# Patient Record
Sex: Female | Born: 1955
Health system: Southern US, Community
[De-identification: ages and names within clinical notes are randomized; demographics above are authoritative.]

## PROBLEM LIST (undated history)

## (undated) DIAGNOSIS — F32A Depression, unspecified: Secondary | ICD-10-CM

## (undated) DIAGNOSIS — M1990 Unspecified osteoarthritis, unspecified site: Secondary | ICD-10-CM

## (undated) DIAGNOSIS — F419 Anxiety disorder, unspecified: Secondary | ICD-10-CM

## (undated) DIAGNOSIS — F329 Major depressive disorder, single episode, unspecified: Secondary | ICD-10-CM

## (undated) DIAGNOSIS — M797 Fibromyalgia: Secondary | ICD-10-CM

## (undated) DIAGNOSIS — Z9189 Other specified personal risk factors, not elsewhere classified: Secondary | ICD-10-CM

## (undated) HISTORY — DX: Fibromyalgia: M79.7

## (undated) HISTORY — PX: OVARIAN CYST REMOVAL: SHX89

## (undated) HISTORY — PX: JOINT REPLACEMENT: SHX530

## (undated) HISTORY — PX: KNEE ARTHROSCOPY: SHX127

## (undated) HISTORY — PX: OTHER SURGICAL HISTORY: SHX169

## (undated) HISTORY — DX: Other specified personal risk factors, not elsewhere classified: Z91.89

## (undated) HISTORY — DX: Unspecified osteoarthritis, unspecified site: M19.90

## (undated) HISTORY — PX: KNEE SURGERY: SHX244

---

## 2004-09-19 ENCOUNTER — Other Ambulatory Visit: Admission: RE | Admit: 2004-09-19 | Discharge: 2004-09-19 | Payer: Self-pay | Admitting: Gynecology

## 2005-12-04 ENCOUNTER — Other Ambulatory Visit: Admission: RE | Admit: 2005-12-04 | Discharge: 2005-12-04 | Payer: Self-pay | Admitting: Obstetrics and Gynecology

## 2007-11-26 ENCOUNTER — Other Ambulatory Visit: Admission: RE | Admit: 2007-11-26 | Discharge: 2007-11-26 | Payer: Self-pay | Admitting: Obstetrics and Gynecology

## 2008-05-15 ENCOUNTER — Encounter: Admission: RE | Admit: 2008-05-15 | Discharge: 2008-05-15 | Payer: Self-pay | Admitting: Family Medicine

## 2008-12-08 ENCOUNTER — Other Ambulatory Visit: Admission: RE | Admit: 2008-12-08 | Discharge: 2008-12-08 | Payer: Self-pay | Admitting: Obstetrics and Gynecology

## 2015-11-21 ENCOUNTER — Other Ambulatory Visit: Payer: Self-pay

## 2015-11-21 ENCOUNTER — Other Ambulatory Visit: Payer: Self-pay | Admitting: Physician Assistant

## 2015-11-21 ENCOUNTER — Ambulatory Visit
Admission: RE | Admit: 2015-11-21 | Discharge: 2015-11-21 | Disposition: A | Discharge status: Home / Self Care | Payer: Medicaid Other | Source: Ambulatory Visit | Attending: Physician Assistant | Admitting: Physician Assistant

## 2015-11-21 DIAGNOSIS — M25562 Pain in left knee: Secondary | ICD-10-CM

## 2015-11-21 DIAGNOSIS — M25561 Pain in right knee: Secondary | ICD-10-CM

## 2015-11-21 DIAGNOSIS — Z1231 Encounter for screening mammogram for malignant neoplasm of breast: Secondary | ICD-10-CM

## 2015-12-11 ENCOUNTER — Ambulatory Visit
Admission: RE | Admit: 2015-12-11 | Discharge: 2015-12-11 | Disposition: A | Discharge status: Home / Self Care | Payer: Medicaid Other | Source: Ambulatory Visit

## 2015-12-11 DIAGNOSIS — Z1231 Encounter for screening mammogram for malignant neoplasm of breast: Secondary | ICD-10-CM

## 2016-04-03 ENCOUNTER — Ambulatory Visit: Payer: Medicare Other | Attending: Specialist | Admitting: Physical Therapy

## 2016-04-03 ENCOUNTER — Encounter: Payer: Self-pay | Admitting: Physical Therapy

## 2016-04-03 DIAGNOSIS — M25562 Pain in left knee: Secondary | ICD-10-CM | POA: Diagnosis present

## 2016-04-03 DIAGNOSIS — M25662 Stiffness of left knee, not elsewhere classified: Secondary | ICD-10-CM | POA: Diagnosis present

## 2016-04-03 DIAGNOSIS — R262 Difficulty in walking, not elsewhere classified: Secondary | ICD-10-CM | POA: Insufficient documentation

## 2016-04-03 DIAGNOSIS — M797 Fibromyalgia: Secondary | ICD-10-CM | POA: Insufficient documentation

## 2016-04-03 NOTE — Therapy (Signed)
[place], [place], [address] Phone: [phone]   Fax:  [phone]  Physical Therapy Treatment  Patient Details  Name: Betty Holmes MRN: [id number] Date of Birth: 01-Apr-1956 Referring Provider: Hart Robinsons   Encounter Date: 04/03/2016      PT End of Session - 04/03/16 1707    Visit Number 1   Number of Visits 16   Date for PT Re-Evaluation 05/29/16   PT Start Time 1336   PT Stop Time 1430   PT Time Calculation (min) 54 min   Activity Tolerance Patient tolerated treatment well   Behavior During Therapy [facility] for tasks assessed/performed      Past Medical History  Diagnosis Date  . Fibromyalgia     No past surgical history on file.  There were no vitals filed for this visit.      Subjective Assessment - 04/03/16 1403    Subjective Pt comes in today reporting 6/10 pain in the R knee.    Pertinent History Pt has a long histroy of poping and pain in her knee. 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 She had a medial and lateral menical debirdement and syneevectomy on 03-21-16. Since that point she has been unable to kneell and to walk  community distances.    How long can you sit comfortably? she can sit for about a half an hour    How long can you stand comfortably? patient can stand comfortably for 15 min    How long can you walk comfortably? 15 minutes befoire the knee starts to hurt.    Diagnostic tests nothing post op    Patient Stated Goals to be able to walk again    Pain Score 6    Pain Location Knee   Pain Orientation Right   Pain Descriptors / Indicators Aching   Pain Onset Today   Pain Frequency Constant   Aggravating Factors  standing and walking    Pain Relieving Factors rest and aspirn    Effect of Pain on Daily Activities difficulty performing workj tasks             [facility] PT Assessment - 04/03/16 0001    Assessment   Medical  Diagnosis L knee menisectomy and synovectomy    Referring Provider Hart Robinsons    Onset Date/Surgical Date 03/22/16   Next MD Visit 04/29/16   Prior Therapy n   Precautions   Precautions None   Restrictions   Weight Bearing Restrictions No   Balance Screen   Has the patient fallen in the past 6 months No   Home Environment   Additional Comments Pt has 4 steps to get into the house.    Prior Function   Level of Independence --  Indepdnent and an avid walker    Cognition   Overall Cognitive Status Within Functional Limits for tasks assessed   Observation/Other Assessments   Observations good posture, Siignificant lateral placement of L patella    Sensation   Additional Comments No decrease in sensation noted.    Posture/Postural Control   Posture Comments Normal posture   ROM / Strength   AROM / PROM / Strength AROM;PROM;Strength   AROM   Overall AROM  --  L active ROM 130   Left Knee Extension 110   PROM   Left Knee Extension 115   Strength   Overall Strength Comments R hip flexion 4/5 all other  hip strength 5/5    Left Knee Flexion 5/5   Left Knee Extension 4+/5   Palpation   Patella mobility poor superior /inferior patella movement on the R compared to the L    Palpation comment No significant TTP    Ambulation/Gait   Gait Comments decreased L heel strike. hell strike improved with cuing    Balance   Balance Assessed --  Single leg stance on the L 10 seconds without UE support.                      [place] Adult PT Treatment/Exercise - 04/03/16 0001    Knee/Hip Exercises: Standing   Knee Flexion Limitations standing march 1x10    Hip ADduction Limitations standing 1x10    Extension Limitations 1x10    SLS 3x10sec holds    Knee/Hip Exercises: Supine   Quad Sets Limitations 2x10   Straight Leg Raises Limitations 2x10                PT Education - 04/03/16 1705    Education provided Yes   Education Details Pt educated on surgery and  expected outcomes. She was also educated on the causes of crpitus early in post op rehabilitation. Educated on symptom mangement and walking distances.    Person(s) Educated Patient   Methods Explanation;Demonstration;Verbal cues;Handout;Tactile cues   Comprehension Verbalized understanding;Returned demonstration          PT Short Term Goals - 04/03/16 1722    PT SHORT TERM GOAL #1   Title Pt will demsotrate full pain free knee PROM    Baseline pain at end ranges    Time 4   Period Weeks   Status New   PT SHORT TERM GOAL #2   Title Pt will increase L single leg stance to 20 seconds   Baseline 10 seconds on the L    Time 4   Period Weeks   Status New   PT SHORT TERM GOAL #3   Title Pt will demsotrate 5/5 gross B LE strength    Baseline 4/5 B hip flexion strength    Time 4   Period Weeks   Status New   PT SHORT TERM GOAL #4   Title Pt will report 2/10 pain at worst   Baseline 6/10 pain at worst    Time 4   Period Weeks   Status New           PT Long Term Goals - 04/03/16 1725    PT LONG TERM GOAL #1   Title Pt will ambualte a mile without significant pain in order to perform main form of exercise    Baseline difficulty performing community distances.    PT LONG TERM GOAL #2   Title Pt will kneel for 35 minutes without self reported pain in order to perform work tasks.    Baseline unable    PT LONG TERM GOAL #3   Title Pt will go up and down 6 steps with reciprocal gait pattern in order to go in and out of her house safely    Baseline step to step pattern    Time 8   Period Weeks   Status Not Met               Plan - 04/03/16 1715    Clinical Impression Statement Pt is a 60 year old female S/P medial and lateral menisectomys on 02-21-15. She presents with decreased strength, stability and ability to  ambulate community distances. She also has to kneel for long periods of time at her job. She is a low complexity eval. She would benefit from skilled therapy to  decrease pain and improve her ability to perfom work tasks.    Rehab Potential Good   PT Frequency 2x / week   PT Duration 8 weeks   PT Treatment/Interventions ADLs/Self Care Home Management;Electrical Stimulation;Cryotherapy;Ultrasound;Functional mobility training;Therapeutic activities;Therapeutic exercise;Manual techniques;Patient/family education   PT Next Visit Plan add mini squats, steps, exercise bike, heel raise, LAQ SAQ TKE, Continue to work on regaining full pain free ROM    PT Home Exercise Plan quad sets, heel slides with a strap, knee extension with towell roll, single leg stance.  SLR    Consulted and Agree with Plan of Care Patient      Patient will benefit from skilled therapeutic intervention in order to improve the following deficits and impairments:  Decreased mobility, Difficulty walking, Decreased range of motion, Decreased endurance, Increased edema, Pain, Hypermobility  Visit Diagnosis: Pain in left knee - Plan: PT plan of care cert/re-cert  Stiffness of left knee, not elsewhere classified - Plan: PT plan of care cert/re-cert  Difficulty in walking, not elsewhere classified - Plan: PT plan of care cert/re-cert       G-Codes - 2016/04/23 1729    Functional Assessment Tool Used clinical decision making, objective measures, PLOF   Functional Limitation Mobility: Walking and moving around   Mobility: Walking and Moving Around Current Status (E4235) At least 40 percent but less than 60 percent impaired, limited or restricted   Mobility: Walking and Moving Around Goal Status (T6144) At least 1 percent but less than 20 percent impaired, limited or restricted      Problem List Patient Active Problem List   Diagnosis Date Noted  . Fibromyalgia     Carney Living PT DPT  Apr 23, 2016, 5:32 PM  [facility] [address] [place], [place], [address] Phone: [phone]   Fax:  [phone]  Name: Betty Holmes MRN: [id number] Date of Birth: [birth year]

## 2016-04-08 ENCOUNTER — Ambulatory Visit: Payer: Medicare Other | Admitting: Physical Therapy

## 2016-04-08 DIAGNOSIS — M25562 Pain in left knee: Secondary | ICD-10-CM | POA: Diagnosis not present

## 2016-04-08 DIAGNOSIS — M25662 Stiffness of left knee, not elsewhere classified: Secondary | ICD-10-CM

## 2016-04-08 DIAGNOSIS — R262 Difficulty in walking, not elsewhere classified: Secondary | ICD-10-CM

## 2016-04-08 NOTE — Therapy (Signed)
[place], [place], [address] Phone: [phone]   Fax:  [phone]  Physical Therapy Treatment  Patient Details  Name: Betty Holmes MRN: [id number] Date of Birth: December 02, 1956 Referring Provider: Hart Robinsons   Encounter Date: 04/08/2016      PT End of Session - 04/08/16 1410    Visit Number 2   Number of Visits 16   Date for PT Re-Evaluation 05/29/16   PT Start Time 1332   PT Stop Time 1426   PT Time Calculation (min) 54 min   Activity Tolerance Patient tolerated treatment well   Behavior During Therapy [facility] for tasks assessed/performed      Past Medical History  Diagnosis Date  . Fibromyalgia     No past surgical history on file.  There were no vitals filed for this visit.      Subjective Assessment - 04/08/16 1340    Subjective No pain in knee today. My legs always hurt and are swollen. Fibro? Has been doing her ex at home.     Currently in Pain? No/denies            [facility] Adult PT Treatment/Exercise - 04/08/16 1346    Knee/Hip Exercises: Aerobic   Nustep L 5 LE only for 6 min    Knee/Hip Exercises: Standing   Hip Abduction Stengthening;Both;1 set;10 reps;Knee straight   Hip Extension Stengthening;Both;1 set;10 reps   Extension Limitations cues to activate quad    Forward Step Up Left;1 set;15 reps;Hand Hold: 2   Functional Squat 1 set;10 reps   Functional Squat Limitations pain in ant knee    SLS with Vectors Arcs, in SLS each LE with 1 UE asst intermittently   Knee/Hip Exercises: Seated   Long Arc Quad Strengthening;Left;1 set;20 reps;Other (comment)   Long Arc Quad Limitations ball squeeze for VMO    Ball Squeeze x 10    Knee/Hip Exercises: Supine   Quad Sets Strengthening;Left;1 set;10 reps   [employer] Limitations HEP    Short Arc [employer] Strengthening;Left;1 set;10 reps   Short Hydrologist Limitations HEP   Straight Leg Raises Strengthening;Left;1 set;20  reps   Straight Leg Raises Limitations HEP    Straight Leg Raise with External Rotation Strengthening;Left;1 set;10 reps   Straight Leg Raise with External Rotation Limitations HEP   Modalities   Modalities Cryotherapy   Cryotherapy   Number Minutes Cryotherapy 8 Minutes   Cryotherapy Location Knee   Type of Cryotherapy Ice pack                PT Education - 04/08/16 1409    Education provided Yes   Education Details HEP, cues for quad muscle activation    Person(s) Educated Patient   Methods Explanation;Demonstration   Comprehension Verbalized understanding;Returned demonstration          PT Short Term Goals - 04/08/16 1422    PT SHORT TERM GOAL #1   Title Pt will demonstrate full pain free knee PROM    Status On-going   PT SHORT TERM GOAL #2   Title Pt will increase L single leg stance to 20 seconds   Status On-going   PT SHORT TERM GOAL #3   Title Pt will demsotrate 5/5 gross B LE strength    Status On-going   PT SHORT TERM GOAL #4   Title Pt will report 2/10 pain at worst   Status On-going  PT Long Term Goals - 04/08/16 1421    PT LONG TERM GOAL #1   Title Pt will ambulate a mile without significant pain in order to perform main form of exercise    Status On-going   PT LONG TERM GOAL #2   Title Pt will kneel for 35 minutes without self reported pain in order to perform work tasks.    Status On-going   PT LONG TERM GOAL #3   Title Pt will go up and down 6 steps with reciprocal gait pattern in order to go in and out of her house safely    Status On-going               Plan - 04/08/16 1410    Clinical Impression Statement Patient able to do all exercises prescribed with min cues.  She does report Rt. knee pain and instability at times due to overuse.     PT Next Visit Plan add mini squats, steps, exercise bike, heel raise, LAQ SAQ TKE, Continue to work on regaining full pain free ROM    PT Home Exercise Plan quad sets, heel slides  with a strap, knee extension with towell roll, single leg stance.  SLR    Consulted and Agree with Plan of Care Patient      Patient will benefit from skilled therapeutic intervention in order to improve the following deficits and impairments:     Visit Diagnosis: Pain in left knee  Stiffness of left knee, not elsewhere classified  Difficulty in walking, not elsewhere classified     Problem List Patient Active Problem List   Diagnosis Date Noted  . Fibromyalgia     Lonza Shimabukuro 04/08/2016, 2:30 PM  [place], [place], [address] Phone: [phone]   Fax:  [phone]  Name: Betty Holmes MRN: [id number] Date of Birth: 11-29-1956   Raeford Razor, PT 04/08/2016 2:30 PM Phone: [phone] Fax: [phone]

## 2016-04-10 ENCOUNTER — Ambulatory Visit: Payer: Medicare Other | Admitting: Physical Therapy

## 2016-04-10 DIAGNOSIS — M25562 Pain in left knee: Secondary | ICD-10-CM | POA: Diagnosis not present

## 2016-04-10 DIAGNOSIS — M25662 Stiffness of left knee, not elsewhere classified: Secondary | ICD-10-CM

## 2016-04-10 DIAGNOSIS — R262 Difficulty in walking, not elsewhere classified: Secondary | ICD-10-CM

## 2016-04-10 NOTE — Therapy (Signed)
[place], [place], [address] Phone: [phone]   Fax:  [phone]  Physical Therapy Treatment  Patient Details  Name: CAMANI TAUBMAN MRN: [id number] Date of Birth: [birth year] Referring Provider: Hart Robinsons   Encounter Date: 04/10/2016      PT End of Session - 04/10/16 1622    Visit Number 3   Number of Visits 16   Date for PT Re-Evaluation 05/29/16   PT Start Time 1330   PT Stop Time 1415   PT Time Calculation (min) 45 min   Activity Tolerance Patient tolerated treatment well   Behavior During Therapy [facility] for tasks assessed/performed      Past Medical History  Diagnosis Date  . Fibromyalgia     No past surgical history on file.  There were no vitals filed for this visit.      Subjective Assessment - 04/10/16 1335    Subjective 2/10 soreness with sitting   .She did 30 minutes of yoga she noticed a clunk inside kneee or hip    Currently in Pain? Yes   Pain Score 2   up to 7/10   Pain Descriptors / Indicators Aching   Aggravating Factors  YOGA,  doing too much   Pain Relieving Factors excedrine.   Multiple Pain Sites --  Has stenosis in neck constant.                           [place] Adult PT Treatment/Exercise - 04/10/16 0001    Self-Care   Self-Care --  do's and don'ts,  no pivot and twist,  avoid harmful pain,+   Knee/Hip Exercises: Aerobic   Recumbent Bike 6 minutes 1.4 miles   Knee/Hip Exercises: Standing   Heel Raises 10 reps   Terminal Knee Extension Limitations 10 X each with purple ball against wall. 10 X added to hpome program   Hip Abduction Stengthening;Both;1 set;10 reps;Knee straight  10 X   Abduction Limitations cues for neutral hip vs ER   Hip Extension Stengthening;10 reps   Extension Limitations cues to activate quad    Functional Squat 10 reps  clunking in knee so stopped and did not add.mini squat   Knee/Hip Exercises: Seated   Long  Arc Quad Strengthening;10 reps  20 X 0 LBS   Long Arc Quad Weight --  3 reps with AA extension 5 second hold and eccentric lowerin   Knee/Hip Exercises: Supine   Short Arc Quad Sets Strengthening;Left;1 set;10 reps   Straight Leg Raises Strengthening  cued for quad set                PT Education - 04/10/16 1621    Education provided Yes   Education Details knee strengthening, DO's and don'ts   Person(s) Educated Patient   Methods Explanation;Demonstration;Tactile cues;Verbal cues;Handout   Comprehension Verbalized understanding;Returned demonstration          PT Short Term Goals - 04/10/16 1625    PT SHORT TERM GOAL #1   Title Pt will demonstrate full pain free knee PROM    Time 4   Period Weeks   Status On-going   PT SHORT TERM GOAL #2   Title Pt will increase L single leg stance to 20 seconds   Time 4   Period Weeks   Status Unable to assess   PT SHORT TERM GOAL #3   Title Pt will demsotrate 5/5 gross B LE strength  Time 4   Period Weeks   Status Unable to assess   PT SHORT TERM GOAL #4   Title Pt will report 2/10 pain at worst   Baseline 6-7/10   Time 4   Period Weeks   Status On-going           PT Long Term Goals - 04/08/16 1421    PT LONG TERM GOAL #1   Title Pt will ambulate a mile without significant pain in order to perform main form of exercise    Status On-going   PT LONG TERM GOAL #2   Title Pt will kneel for 35 minutes without self reported pain in order to perform work tasks.    Status On-going   PT LONG TERM GOAL #3   Title Pt will go up and down 6 steps with reciprocal gait pattern in order to go in and out of her house safely    Status On-going               Plan - 04/10/16 1623    Clinical Impression Statement NO increased pain with exercise.  Progress toward home exercise goals.  She had many questions about what she can/shouldn't do with knee.  Knee clunks with instaility? with mini squat so stopped.    PT Next  Visit Plan review LAQ, terminal knee, ball at wall.  Try some closed chain. 4 in step ups?calf stretches.    PT Home Exercise Plan LAQ, terminal knee extension.   Consulted and Agree with Plan of Care Patient      Patient will benefit from skilled therapeutic intervention in order to improve the following deficits and impairments:  Decreased mobility, Difficulty walking, Decreased range of motion, Decreased endurance, Increased edema, Pain, Hypermobility  Visit Diagnosis: Pain in left knee  Stiffness of left knee, not elsewhere classified  Difficulty in walking, not elsewhere classified     Problem List Patient Active Problem List   Diagnosis Date Noted  . Fibromyalgia     HARRIS,KAREN 04/10/2016, 4:27 PM  [facility] [address] [place], [place], [address] Phone: [phone]   Fax:  [phone]  Name: HADLEI RAMALEY MRN: [id number] Date of Birth: 01-31-1956    Melvenia Needles, PTA 04/10/2016 4:27 PM Phone: [phone] Fax: [phone]

## 2016-04-10 NOTE — Patient Instructions (Signed)
Hand drawn: terminal knee extension, pressing pillow into wall.  1-2 X a day.  10 to 30 X 5 second hold. From exercise drawer: LAQ  AA lift into extension, hold 5 seconds, lower slowly.  Pain free, may use light weights. Daily

## 2016-04-16 ENCOUNTER — Ambulatory Visit: Payer: Medicare Other | Admitting: Physical Therapy

## 2016-04-16 DIAGNOSIS — M25562 Pain in left knee: Secondary | ICD-10-CM

## 2016-04-16 DIAGNOSIS — M25662 Stiffness of left knee, not elsewhere classified: Secondary | ICD-10-CM

## 2016-04-16 DIAGNOSIS — R262 Difficulty in walking, not elsewhere classified: Secondary | ICD-10-CM

## 2016-04-16 NOTE — Therapy (Signed)
[place], [place], [address] Phone: [phone]   Fax:  [phone]  Physical Therapy Treatment  Patient Details  Name: Betty Holmes MRN: [id number] Date of Birth: 1956-06-20 Referring Provider: Hart Robinsons   Encounter Date: 04/16/2016      PT End of Session - 04/16/16 1410    Visit Number 4   Number of Visits 16   Date for PT Re-Evaluation 05/29/16   Authorization Type medicare/ medicaid    Authorization Time Period KX modifier at 15 visits    PT Start Time 0845   PT Stop Time 0930   PT Time Calculation (min) 45 min      Past Medical History  Diagnosis Date  . Fibromyalgia     No past surgical history on file.  There were no vitals filed for this visit.                       [place] Adult PT Treatment/Exercise - 04/16/16 0001    Knee/Hip Exercises: Aerobic   Recumbent Bike 6 minutes 1.4 miles   Knee/Hip Exercises: Standing   Heel Raises 20 reps   Hip Abduction Stengthening;Both;1 set;10 reps;Knee straight  10 X   Abduction Limitations cues for neutral hip vs ER   Hip Extension Stengthening;10 reps   Extension Limitations cues to activate quad    Lateral Step Up Limitations 2x10 6 inch step    Forward Step Up Limitations 2x10 6 inch step    Functional Squat Limitations 20reps   Rocker Board Limitations 3x20sec holds    Knee/Hip Exercises: Seated   Long Arc Quad Limitations 2x10 lb    Knee/Hip Exercises: Supine   Short Arc Quad Sets Strengthening;Left;1 set;10 reps   Short Arc Quad Sets Limitations 2x10 2lb    Straight Leg Raises Strengthening  cued for quad set   Straight Leg Raises Limitations 2x10 1 lb                 PT Education - 04/16/16 1325    Education Details begin single leg stance at home    Person(s) Educated Patient   Methods Explanation   Comprehension Verbalized understanding;Returned demonstration          PT Short Term  Goals - 04/16/16 1413    PT SHORT TERM GOAL #1   Title Pt will demonstrate full pain free knee PROM    Baseline slight pain at end range but improving    Time 4   Period Days   Status On-going   PT SHORT TERM GOAL #2   Title Pt will increase L single leg stance to 20 seconds   Baseline 20 seconds performed with no LOB    Time 4   Period Weeks   Status Achieved   PT SHORT TERM GOAL #3   Title Pt will demsotrate 5/5 gross B LE strength    Baseline 4/5 B hip flexion strength    Time 4   Period Weeks   Status On-going   PT SHORT TERM GOAL #4   Title Pt will report 2/10 pain at worst   Baseline 6-7/10   Time 4   Period Weeks   Status On-going           PT Long Term Goals - 04/08/16 1421    PT LONG TERM GOAL #1   Title Pt will ambulate a mile without significant pain in order to perform main form of exercise  Status On-going   PT LONG TERM GOAL #2   Title Pt will kneel for 35 minutes without self reported pain in order to perform work tasks.    Status On-going   PT LONG TERM GOAL #3   Title Pt will go up and down 6 steps with reciprocal gait pattern in order to go in and out of her house safely    Status On-going               Plan - 04/16/16 1411    Clinical Impression Statement Pt tolerated treatment well. Good single leg stability noted today. Good stability noted with stairs See below for goal speciific progress.    Rehab Potential Good   PT Frequency 2x / week   PT Duration 8 weeks   PT Treatment/Interventions ADLs/Self Care Home Management;Electrical Stimulation;Cryotherapy;Ultrasound;Functional mobility training;Therapeutic activities;Therapeutic exercise;Manual techniques;Patient/family education   PT Next Visit Plan review LAQ, terminal knee, ball at wall.  Try some closed chain. 4 in step ups?calf stretches.    PT Home Exercise Plan LAQ, terminal knee extension.   Consulted and Agree with Plan of Care Patient      Patient will benefit from skilled  therapeutic intervention in order to improve the following deficits and impairments:  Decreased mobility, Difficulty walking, Decreased range of motion, Decreased endurance, Increased edema, Pain, Hypermobility  Visit Diagnosis: Pain in left knee  Stiffness of left knee, not elsewhere classified  Difficulty in walking, not elsewhere classified   There-ex: to improve strenth and stability.   Problem List Patient Active Problem List   Diagnosis Date Noted  . Fibromyalgia     Carney Living PT DPT  04/16/2016, 2:18 PM  [facility] [address] [place], [place], [address] Phone: [phone]   Fax:  [phone]  Name: Betty Holmes MRN: [id number] Date of Birth: [birth year]

## 2016-04-18 ENCOUNTER — Ambulatory Visit: Payer: Medicare Other | Admitting: Physical Therapy

## 2016-04-18 DIAGNOSIS — M25562 Pain in left knee: Secondary | ICD-10-CM | POA: Diagnosis not present

## 2016-04-18 DIAGNOSIS — R262 Difficulty in walking, not elsewhere classified: Secondary | ICD-10-CM

## 2016-04-18 DIAGNOSIS — M25662 Stiffness of left knee, not elsewhere classified: Secondary | ICD-10-CM

## 2016-04-18 NOTE — Therapy (Signed)
[place], [place], [address] Phone: [phone]   Fax:  [phone]  Physical Therapy Treatment  Patient Details  Name: Betty Holmes MRN: [id number] Date of Birth: 1956-03-12 Referring Provider: Hart Robinsons   Encounter Date: 04/18/2016      PT End of Session - 04/18/16 0929    Visit Number 5   Number of Visits 16   Date for PT Re-Evaluation 05/29/16   Authorization Type medicare/ medicaid    Authorization Time Period KX modifier at 15 visits    PT Start Time 0847   PT Stop Time 0927   PT Time Calculation (min) 40 min   Activity Tolerance Patient limited by pain   Behavior During Therapy [facility] for tasks assessed/performed      Past Medical History  Diagnosis Date  . Fibromyalgia     No past surgical history on file.  There were no vitals filed for this visit.      Subjective Assessment - 04/18/16 0854    Subjective Pt reports her pain today is a 4/10. She was sore after the last visit. She had some throbbing on Tuesday night. She    Pertinent History Pt has a long histroy of poping and pain in her knee. 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 She had a medial and lateral menical debirdement and syneevectomy on 03-21-16. Since that point she has been unable to kneell and to walk  community distances.    How long can you sit comfortably? she can sit for about a half an hour    How long can you stand comfortably? patient can stand comfortably for 15 min    How long can you walk comfortably? 15 minutes befoire the knee starts to hurt.    Diagnostic tests nothing post op    Patient Stated Goals to be able to walk again    Pain Score 4    Pain Location Knee   Pain Orientation Right   Pain Descriptors / Indicators Aching   Pain Onset Today   Pain Frequency Constant   Multiple Pain Sites No                         [facility] Adult PT  Treatment/Exercise - 04/18/16 0001    Knee/Hip Exercises: Aerobic   Recumbent Bike 6 minutes 1.4 miles   Knee/Hip Exercises: Standing   Heel Raises 20 reps   Hip Abduction Stengthening;Both;1 set;10 reps;Knee straight  10 X   Abduction Limitations cues for neutral hip vs ER   Hip Extension Stengthening;10 reps   Extension Limitations cues to activate quad    Lateral Step Up Limitations 2x10 4 inch step    Forward Step Up Limitations 2x10 4 inch step    Functional Squat Limitations 20reps   Rocker Board Limitations 3x20sec holds    Knee/Hip Exercises: Seated   Long Arc Quad Limitations 2x10     Knee/Hip Exercises: Supine   Short Arc Quad Sets Strengthening;Left;1 set;10 reps   Short Arc Quad Sets Limitations 2x10    Straight Leg Raises Strengthening  cued for quad set   Straight Leg Raises Limitations 2x10  lb    Modalities   Modalities Cryotherapy  to R knee x 10 min    Cryotherapy   Number Minutes Cryotherapy 10 Minutes   Cryotherapy Location Knee  PT Education - 04/18/16 0900    Education Details If the patient is sore go with straight leg closed chain exercises. Continue to ince.    Person(s) Educated Patient   Methods Explanation   Comprehension Verbalized understanding          PT Short Term Goals - 04/16/16 1413    PT SHORT TERM GOAL #1   Title Pt will demonstrate full pain free knee PROM    Baseline slight pain at end range but improving    Time 4   Period Days   Status On-going   PT SHORT TERM GOAL #2   Title Pt will increase L single leg stance to 20 seconds   Baseline 20 seconds performed with no LOB    Time 4   Period Weeks   Status Achieved   PT SHORT TERM GOAL #3   Title Pt will demsotrate 5/5 gross B LE strength    Baseline 4/5 B hip flexion strength    Time 4   Period Weeks   Status On-going   PT SHORT TERM GOAL #4   Title Pt will report 2/10 pain at worst   Baseline 6-7/10   Time 4   Period Weeks   Status On-going            PT Long Term Goals - 04/08/16 1421    PT LONG TERM GOAL #1   Title Pt will ambulate a mile without significant pain in order to perform main form of exercise    Status On-going   PT LONG TERM GOAL #2   Title Pt will kneel for 35 minutes without self reported pain in order to perform work tasks.    Status On-going   PT LONG TERM GOAL #3   Title Pt will go up and down 6 steps with reciprocal gait pattern in order to go in and out of her house safely    Status On-going       There-ex: to improve strength and decrease pain         Plan - 04/18/16 0930    Clinical Impression Statement Depsite soreness the patient tolerated exercises well. She reports at home she has been doing a stretch were she pulls her L knee up inside of her R leg and it has been hurting. She was advised not to do that at this time. She reported L L fatigue with treatment.. She was advised to give herself a day to rest tomorrow if she continues to feel fatigue and muscle soreness. Overall she is progressing. She was also advised to ice at home.    Rehab Potential Good   PT Frequency 2x / week   PT Duration 8 weeks   PT Treatment/Interventions ADLs/Self Care Home Management;Electrical Stimulation;Cryotherapy;Ultrasound;Functional mobility training;Therapeutic activities;Therapeutic exercise;Manual techniques;Patient/family education   PT Next Visit Plan advance weights with ther-ex if able.    PT Home Exercise Plan mini squats, heel raises, standing marching, single leg stance.    Consulted and Agree with Plan of Care Patient      Patient will benefit from skilled therapeutic intervention in order to improve the following deficits and impairments:  Decreased mobility, Difficulty walking, Decreased range of motion, Decreased endurance, Increased edema, Pain, Hypermobility  Visit Diagnosis: No diagnosis found.     Problem List Patient Active Problem List   Diagnosis Date Noted  . Fibromyalgia      Carney Living PT DPT  04/18/2016, 2:43 PM  [facility]  [place], [place], [address] Phone: [phone]   Fax:  [phone]  Name: Betty Holmes MRN: [id number] Date of Birth: 1956-03-24

## 2016-04-23 ENCOUNTER — Ambulatory Visit: Payer: Medicare Other | Attending: Specialist | Admitting: Physical Therapy

## 2016-04-23 DIAGNOSIS — M25662 Stiffness of left knee, not elsewhere classified: Secondary | ICD-10-CM | POA: Insufficient documentation

## 2016-04-23 DIAGNOSIS — R262 Difficulty in walking, not elsewhere classified: Secondary | ICD-10-CM | POA: Diagnosis present

## 2016-04-23 DIAGNOSIS — M25562 Pain in left knee: Secondary | ICD-10-CM

## 2016-04-23 NOTE — Therapy (Signed)
[place], [place], [address] Phone: [phone]   Fax:  [phone]  Physical Therapy Treatment  Patient Details  Name: Betty Holmes MRN: [id number] Date of Birth: 1956/08/12 Referring Provider: Hart Robinsons   Encounter Date: 04/23/2016      PT End of Session - 04/23/16 1703    Visit Number 6   Number of Visits 16   Date for PT Re-Evaluation 05/29/16   Authorization Time Period KX modifier at 15 visits    PT Start Time 0846   PT Stop Time 0930   PT Time Calculation (min) 44 min   Activity Tolerance Patient limited by pain   Behavior During Therapy [facility] for tasks assessed/performed      Past Medical History  Diagnosis Date  . Fibromyalgia     No past surgical history on file.  There were no vitals filed for this visit.      Subjective Assessment - 04/23/16 1648    Subjective Pt reports significant pain oiver the weekend. She had to go up and down stairs around 20 times at her temple. She also did a significant amount of walking. She flet like her ankle was swelling as well. She had pain around her  lateral knee. She has been doing her exercises.    Pertinent History Pt has a long histroy of poping and pain in her knee. 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 She had a medial and lateral menical debirdement and syneevectomy on 03-21-16. Since that point she has been unable to kneell and to walk  community distances.    How long can you sit comfortably? she can sit for about a half an hour    How long can you stand comfortably? patient can stand comfortably for 15 min    How long can you walk comfortably? 15 minutes befoire the knee starts to hurt.    Diagnostic tests nothing post op    Patient Stated Goals to be able to walk again    Pain Score 6    Pain Onset More than a month ago   Multiple Pain Sites No                          [facility] Adult PT Treatment/Exercise - 04/23/16 0001    Knee/Hip Exercises: Aerobic   Recumbent Bike 5 minutes 1.4 miles   Knee/Hip Exercises: Standing   Heel Raises 20 reps   Terminal Knee Extension Limitations 10 X each with purple ball against wall. 10 X added to hpome program   Rocker Board Limitations 3x20sec holds    Knee/Hip Exercises: Supine   Straight Leg Raises Strengthening  cued for quad set   Straight Leg Raises Limitations 3x10     Modalities   Modalities Cryotherapy  to R knee x 10 min    Cryotherapy   Number Minutes Cryotherapy 10 Minutes   Cryotherapy Location Knee   Manual Therapy   Manual therapy comments Grade 1 and II PA and AP mobs for pain; patellar mobs; edema massage from ankle to above the left knee                 PT Education - 04/23/16 1659    Education Details Pt sadvised to do light closed chain exercises until Thurday. She was also educated on the benefits of ice.    Person(s) Educated Patient  Methods Explanation   Comprehension Verbalized understanding          PT Short Term Goals - 04/23/16 1734    PT SHORT TERM GOAL #1   Title Pt will demonstrate full pain free knee PROM    Baseline full range but pain at end    Time 4   Period Weeks   Status On-going   PT SHORT TERM GOAL #2   Title Pt will increase L single leg stance to 20 seconds   Baseline 20 seconds tofday without difficulty    Period Weeks   Status Achieved   PT SHORT TERM GOAL #3   Title Pt will demsotrate 5/5 gross B LE strength    Baseline not tested    Time 4   Period Weeks   Status On-going   PT SHORT TERM GOAL #4   Title Pt will report 2/10 pain at worst   Baseline 6-7/10   Time 4   Period Weeks   Status On-going           PT Long Term Goals - 04/08/16 1421    PT LONG TERM GOAL #1   Title Pt will ambulate a mile without significant pain in order to perform main form of exercise    Status On-going   PT LONG TERM GOAL #2   Title Pt will kneel for  35 minutes without self reported pain in order to perform work tasks.    Status On-going   PT LONG TERM GOAL #3   Title Pt will go up and down 6 steps with reciprocal gait pattern in order to go in and out of her house safely    Status On-going               Plan - 04/23/16 1731    Clinical Impression Statement Thersapy scaled the patients treatment plan back today and advised her to keep her knee moving but to not do any excessive walking or steps. She was also advised to ice. She reports she has not been icing. At this point the patient is likely doing too much with her knee. Therapy reminded her that she still has arthritis in her knee and even with the surgery if she does an excessive amount of activity she will have pain.    Rehab Potential Good   PT Frequency 2x / week   PT Duration 8 weeks   PT Treatment/Interventions ADLs/Self Care Home Management;Electrical Stimulation;Cryotherapy;Ultrasound;Functional mobility training;Therapeutic activities;Therapeutic exercise;Manual techniques;Patient/family education   PT Next Visit Plan advance weights with ther-ex if able.    PT Home Exercise Plan mini squats, heel raises, standing marching, single leg stance.       Patient will benefit from skilled therapeutic intervention in order to improve the following deficits and impairments:  Decreased mobility, Difficulty walking, Decreased range of motion, Decreased endurance, Increased edema, Pain, Hypermobility  Visit Diagnosis: Pain in left knee  Stiffness of left knee, not elsewhere classified  Difficulty in walking, not elsewhere classified     Problem List Patient Active Problem List   Diagnosis Date Noted  . Fibromyalgia     Carney Living PT DPT  04/23/2016, 5:37 PM  [facility] [address] [place], [place], [address] Phone: [phone]   Fax:  [phone]  Name: Betty Holmes MRN: [id number] Date of Birth:  Sep 19, 1956

## 2016-04-25 ENCOUNTER — Ambulatory Visit: Payer: Medicare Other | Admitting: Physical Therapy

## 2016-04-25 DIAGNOSIS — R262 Difficulty in walking, not elsewhere classified: Secondary | ICD-10-CM

## 2016-04-25 DIAGNOSIS — M25662 Stiffness of left knee, not elsewhere classified: Secondary | ICD-10-CM

## 2016-04-25 DIAGNOSIS — M25562 Pain in left knee: Secondary | ICD-10-CM

## 2016-04-25 NOTE — Therapy (Signed)
[place], [place], [address] Phone: [phone]   Fax:  [phone]  Physical Therapy Treatment  Patient Details  Name: Betty Holmes MRN: [id number] Date of Birth: 04-15-1956 Referring Provider: Hart Robinsons   Encounter Date: 04/25/2016    Past Medical History  Diagnosis Date  . Fibromyalgia     No past surgical history on file.  There were no vitals filed for this visit.      Subjective Assessment - 04/25/16 0919    Subjective Pt reports her pain has been better over the past few days. she hads been resting. She tried to walk yesterday and had some pain.  Her pain level today is about a 3/10. She has been doing her light exercises.    Pertinent History Pt has a long histroy of poping and pain in her knee. 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 She had a medial and lateral menical debirdement and syneevectomy on 03-21-16. Since that point she has been unable to kneell and to walk  community distances.    How long can you sit comfortably? she can sit for about a half an hour    How long can you stand comfortably? patient can stand comfortably for 15 min    How long can you walk comfortably? 15 minutes befoire the knee starts to hurt.    Diagnostic tests nothing post op    Patient Stated Goals to be able to walk again    Pain Score 6    Pain Location Knee   Pain Orientation Right   Pain Descriptors / Indicators Aching   Pain Onset More than a month ago   Pain Frequency Constant                         [facility] Adult PT Treatment/Exercise - 04/25/16 0001    Knee/Hip Exercises: Aerobic   Recumbent Bike 5 minutes 1.4 miles   Knee/Hip Exercises: Standing   Heel Raises 20 reps   Hip Abduction Stengthening;Both;1 set;10 reps;Knee straight  10 X   Abduction Limitations cues for neutral hip vs ER   Hip Extension Stengthening;10 reps   Extension Limitations cues to activate quad    Lateral Step Up Limitations 2x10 4 inch step    Forward Step Up Limitations 2x10 4 inch step    Functional Squat Limitations 20reps   SLS 3x30sec holds    Knee/Hip Exercises: Seated   Long Arc Quad Limitations 2x10     Knee/Hip Exercises: Supine   Short Arc Quad Sets Strengthening;Left;1 set;10 reps   Short Arc Quad Sets Limitations 2x10   Straight Leg Raises Strengthening  cued for quad set   Straight Leg Raises Limitations 3x10     Modalities   Modalities Cryotherapy  to R knee x 10 min    Cryotherapy   Number Minutes Cryotherapy 10 Minutes   Cryotherapy Location Knee   Manual Therapy   Manual therapy comments Grade 1 and II PA and AP mobs for pain; patellar mobs; edema massage from ankle to above the left knee                 PT Education - 04/25/16 0933    Education Details add back in  standing exercises. Modify activity over the weekend if able.    Person(s) Educated Patient   Methods Explanation   Comprehension Verbalized understanding  PT Short Term Goals - 04/23/16 1734    PT SHORT TERM GOAL #1   Title Pt will demonstrate full pain free knee PROM    Baseline full range but pain at end    Time 4   Period Weeks   Status On-going   PT SHORT TERM GOAL #2   Title Pt will increase L single leg stance to 20 seconds   Baseline 20 seconds tofday without difficulty    Period Weeks   Status Achieved   PT SHORT TERM GOAL #3   Title Pt will demsotrate 5/5 gross B LE strength    Baseline not tested    Time 4   Period Weeks   Status On-going   PT SHORT TERM GOAL #4   Title Pt will report 2/10 pain at worst   Baseline 6-7/10   Time 4   Period Weeks   Status On-going           PT Long Term Goals - 04/08/16 1421    PT LONG TERM GOAL #1   Title Pt will ambulate a mile without significant pain in order to perform main form of exercise    Status On-going   PT LONG TERM GOAL #2   Title Pt will kneel  for 35 minutes without self reported pain in order to perform work tasks.    Status On-going   PT LONG TERM GOAL #3   Title Pt will go up and down 6 steps with reciprocal gait pattern in order to go in and out of her house safely    Status On-going               Plan - 04/25/16 1314    Clinical Impression Statement Therapy was able to add back in exercises today. ashe continues to have some soreness in her lateral knee. It appears to be activity releated. Therapy advised the patient she may hvave to scale her tasks back at her temple. She reported last weekend tshe went up and down a set of 3 stairs somewere from 20-30x. Pt will continue her full HEP at home. She would benefit from furter skilled therapy 2W4. She has full range but pain at end range.    Rehab Potential Good   PT Frequency 2x / week   PT Duration 8 weeks   PT Treatment/Interventions ADLs/Self Care Home Management;Electrical Stimulation;Cryotherapy;Ultrasound;Functional mobility training;Therapeutic activities;Therapeutic exercise;Manual techniques;Patient/family education   PT Next Visit Plan advance weights with ther-ex if able.    PT Home Exercise Plan mini squats, heel raises, standing marching, single leg stance.       Patient will benefit from skilled therapeutic intervention in order to improve the following deficits and impairments:  Decreased mobility, Difficulty walking, Decreased range of motion, Decreased endurance, Increased edema, Pain, Hypermobility  Visit Diagnosis: Pain in left knee  Stiffness of left knee, not elsewhere classified  Difficulty in walking, not elsewhere classified     Problem List Patient Active Problem List   Diagnosis Date Noted  . Fibromyalgia     Carney Living PT DPT  04/25/2016, 1:22 PM  [facility] [address] [place], [place], [address] Phone: [phone]   Fax:  [phone]  Name: Betty Holmes MRN:  [id number] Date of Birth: 1956/12/12

## 2016-04-30 ENCOUNTER — Ambulatory Visit: Payer: Medicare Other | Admitting: Physical Therapy

## 2016-04-30 DIAGNOSIS — R262 Difficulty in walking, not elsewhere classified: Secondary | ICD-10-CM

## 2016-04-30 DIAGNOSIS — M25562 Pain in left knee: Secondary | ICD-10-CM | POA: Diagnosis not present

## 2016-04-30 DIAGNOSIS — M25662 Stiffness of left knee, not elsewhere classified: Secondary | ICD-10-CM

## 2016-04-30 NOTE — Therapy (Signed)
[place], [place], [address] Phone: [phone]   Fax:  [phone]  Physical Therapy Treatment  Patient Details  Name: Betty Holmes MRN: [id number] Date of Birth: June 14, 1956 Referring Provider: Hart Robinsons   Encounter Date: 04/30/2016      PT End of Session - 04/30/16 0946    Visit Number 7   Number of Visits 16   Date for PT Re-Evaluation 05/29/16   Authorization Type medicare/ medicaid    Authorization Time Period KX modifier at 15 visits    PT Start Time 0845   PT Stop Time 0930   PT Time Calculation (min) 45 min   Activity Tolerance Patient tolerated treatment well   Behavior During Therapy [facility] for tasks assessed/performed      Past Medical History  Diagnosis Date  . Fibromyalgia     No past surgical history on file.  There were no vitals filed for this visit.      Subjective Assessment - 04/30/16 0940    Subjective Patients pain appears to be activity dependent. She did a large amount of walking and going up and down the steps over the weekend. She had significant pain in her L knee into her left foot. She has been to the MD who beloves her pain in her foot may be coming from hewr back. She will be seen for a back evaluation.    How long can you sit comfortably? she can sit for about a half an hour    How long can you stand comfortably? patient can stand comfortably for 15 min    How long can you walk comfortably? 15 minutes befoire the knee starts to hurt.    Diagnostic tests nothing post op    Patient Stated Goals to be able to walk again    Currently in Pain? Yes   Pain Score 6    Pain Location Knee   Pain Orientation Left   Pain Descriptors / Indicators Aching   Pain Onset More than a month ago                         [facility] Adult PT Treatment/Exercise - 04/30/16 0001    Knee/Hip Exercises: Aerobic   Recumbent Bike 5 minutes 1.4 miles   Knee/Hip  Exercises: Standing   Heel Raises 20 reps   Hip Abduction Stengthening;Both;1 set;10 reps;Knee straight  10 X   Abduction Limitations cues for neutral hip vs ER   Hip Extension Stengthening;10 reps   Extension Limitations cues to activate quad    Lateral Step Up Limitations 2x10 4 inch step    Forward Step Up Limitations 2x10 4 inch step    Functional Squat Limitations 20reps   SLS 3x30sec holds    Knee/Hip Exercises: Seated   Long Arc Quad Limitations 2x10  1lb    Knee/Hip Exercises: Supine   Short Arc Quad Sets Strengthening;Left;1 set;10 reps   Short Arc Quad Sets Limitations 2x10 1lb   Straight Leg Raises Strengthening  cued for quad set   Straight Leg Raises Limitations 3x10 1lb    Cryotherapy   Number Minutes Cryotherapy 10 Minutes   Cryotherapy Location Knee   Manual Therapy   Manual therapy comments Grade 1 and II PA and AP mobs for pain; patellar mobs; edema massage from ankle to above the left knee                 PT  Education - 04/30/16 0945    Education provided Yes   Education Details continue with HEP   Person(s) Educated Patient   Methods Explanation   Comprehension Verbalized understanding;Returned demonstration          PT Short Term Goals - 04/30/16 0951    PT SHORT TERM GOAL #1   Title Pt will demonstrate full pain free knee PROM    Baseline continued pain at end range    Time 4   Period Weeks   Status On-going   PT SHORT TERM GOAL #2   Title Pt will increase L single leg stance to 20 seconds   Baseline 20 seconds tofday without difficulty    Time 4   Period Weeks   Status Achieved   PT SHORT TERM GOAL #3   Title Pt will demsotrate 5/5 gross B LE strength    Baseline not tested    Time 4   Period Weeks   Status On-going   PT SHORT TERM GOAL #4   Title Pt will report 2/10 pain at worst   Baseline 6-7/10 over the weekend 3/210 today            PT Long Term Goals - 04/30/16 0953    PT LONG TERM GOAL #1   Title Pt will  ambulate a mile without significant pain in order to perform main form of exercise    Baseline ambualting but having pain    Time 8   Period Weeks   Status On-going   PT LONG TERM GOAL #2   Title Pt will kneel for 35 minutes without self reported pain in order to perform work tasks.    Baseline still unbable    Time 8   Period Weeks   PT LONG TERM GOAL #3   Title Pt will go up and down 6 steps with reciprocal gait pattern in order to go in and out of her house safely    Baseline performing 8 inch step but with pain   Period Weeks   Status On-going               Plan - 04/30/16 0950    Clinical Impression Statement Pt continues to have pain at end range. She is able to complete exercises with little increase in pain. She seems to have most of her pain on the weekends when she is at her temple. She was advised to try to modify her activity idf able.    Rehab Potential Good   PT Frequency 2x / week   PT Duration 8 weeks   PT Next Visit Plan assess tolerance to weights; assess lower back.    PT Home Exercise Plan mini squats, heel raises, standing marching, single leg stance.    Consulted and Agree with Plan of Care Patient      Patient will benefit from skilled therapeutic intervention in order to improve the following deficits and impairments:  Decreased mobility, Difficulty walking, Decreased range of motion, Decreased endurance, Increased edema, Pain, Hypermobility  Visit Diagnosis: Stiffness of left knee, not elsewhere classified  Pain in left knee  Difficulty in walking, not elsewhere classified      Manual therapy: Grade 1 and 2 mobilizations to decrease pain There-ex: to increase knee strength and stability.    Problem List Patient Active Problem List   Diagnosis Date Noted  . Fibromyalgia     Carney Living PT DPT  04/30/2016, 2:24 PM  [facility] [address]  [place], [place], [address] Phone:  [phone]   Fax:  [phone]  Name: Betty Holmes MRN: [id number] Date of Birth: 08-16-1956

## 2016-05-02 ENCOUNTER — Ambulatory Visit: Payer: Medicare Other | Admitting: Physical Therapy

## 2016-05-02 DIAGNOSIS — M25562 Pain in left knee: Secondary | ICD-10-CM

## 2016-05-02 DIAGNOSIS — M25662 Stiffness of left knee, not elsewhere classified: Secondary | ICD-10-CM

## 2016-05-02 DIAGNOSIS — R262 Difficulty in walking, not elsewhere classified: Secondary | ICD-10-CM

## 2016-05-02 NOTE — Patient Instructions (Addendum)
Sleeping on Back  Place pillow under knees. A pillow with cervical support and a roll around waist are also helpful. Copyright  VHI. All rights reserved.  Sleeping on Side Place pillow between knees. Use cervical support under neck and a roll around waist as needed. Copyright  VHI. All rights reserved.   Sleeping on Stomach   If this is the only desirable sleeping position, place pillow under lower legs, and under stomach or chest as needed.  Posture - Sitting   Sit upright, head facing forward. Try using a roll to support lower back. Keep shoulders relaxed, and avoid rounded back. Keep hips level with knees. Avoid crossing legs for long periods. Stand to Sit / Sit to Stand   To sit: Bend knees to lower self onto front edge of chair, then scoot back on seat. To stand: Reverse sequence by placing one foot forward, and scoot to front of seat. Use rocking motion to stand up.   Work Height and Reach  Ideal work height is no more than 2 to 4 inches below elbow level when standing, and at elbow level when sitting. Reaching should be limited to arm's length, with elbows slightly bent.  Bending  Bend at hips and knees, not back. Keep feet shoulder-width apart.    Posture - Standing   Good posture is important. Avoid slouching and forward head thrust. Maintain curve in low back and align ears over shoul- ders, hips over ankles.  Alternating Positions   Alternate tasks and change positions frequently to reduce fatigue and muscle tension. Take rest breaks. Computer Work   Position work to face forward. Use proper work and seat height. Keep shoulders back and down, wrists straight, and elbows at right angles. Use chair that provides full back support. Add footrest and lumbar roll as needed.  Getting Into / Out of Car  Lower self onto seat, scoot back, then bring in one leg at a time. Reverse sequence to get out.  Dressing  Lie on back to pull socks or slacks over feet, or sit  and bend leg while keeping back straight.    Housework - Sink  Place one foot on ledge of cabinet under sink when standing at sink for prolonged periods.   Pushing / Pulling  Pushing is preferable to pulling. Keep back in proper alignment, and use leg muscles to do the work.  Deep Squat   Squat and lift with both arms held against upper trunk. Tighten stomach muscles without holding breath. Use smooth movements to avoid jerking.  Avoid Twisting   Avoid twisting or bending back. Pivot around using foot movements, and bend at knees if needed when reaching for articles.  Carrying Luggage   Distribute weight evenly on both sides. Use a cart whenever possible. Do not twist trunk. Move body as a unit.   Lifting Principles .Maintain proper posture and head alignment. .Slide object as close as possible before lifting. .Move obstacles out of the way. .Test before lifting; ask for help if too heavy. .Tighten stomach muscles without holding breath. .Use smooth movements; do not jerk. .Use legs to do the work, and pivot with feet. .Distribute the work load symmetrically and close to the center of trunk. .Push instead of pull whenever possible.   Ask For Help   Ask for help and delegate to others when possible. Coordinate your movements when lifting together, and maintain the low back curve.  Log Roll   Lying on back, bend left knee and place left   arm across chest. Roll all in one movement to the right. Reverse to roll to the left. Always move as one unit. Housework - Sweeping  Use long-handled equipment to avoid stooping.   Housework - Wiping  Position yourself as close as possible to reach work surface. Avoid straining your back.  Laundry - Unloading Wash   To unload small items at bottom of washer, lift leg opposite to arm being used to reach.  Sunset close to area to be raked. Use arm movements to do the work. Keep back straight and avoid  twisting.     Cart  When reaching into cart with one arm, lift opposite leg to keep back straight.   Getting Into / Out of Bed  Lower self to lie down on one side by raising legs and lowering head at the same time. Use arms to assist moving without twisting. Bend both knees to roll onto back if desired. To sit up, start from lying on side, and use same move-ments in reverse. Housework - Vacuuming  Hold the vacuum with arm held at side. Step back and forth to move it, keeping head up. Avoid twisting.   Laundry - IT consultant so that bending and twisting can be avoided.   Laundry - Unloading Dryer  Squat down to reach into clothes dryer or use a reacher.  Gardening - Weeding / Probation officer or Kneel. Knee pads may be helpful.                  Piriformis Stretch, Sitting    Sit, one ankle on opposite knee, same-side hand on crossed knee. Push down on knee, keeping spine straight. Lean torso forward, with flat back, until tension is felt in hamstrings and gluteals of crossed-leg side. Hold _10-30__ seconds.  Repeat _3__ times per session. Do _1__ sessions per day.  Copyright  VHI. All rights reserved.  Piriformis Stretch, Standing: Yoga Crossed Leg Chair    Copyright  VHI. All rights reserved.  Piriformis Stretch    Lying on back, pull right knee toward opposite shoulder. Hold 10-30____ seconds. Repeat __3__ times. Do 1 Feet / Roller Chart    1.Horizontal half roller 2.Parallel short, full rollers 3.Perpendicular short, full rollers 4.Parallel short, full roller and ball for weight bearing 5.Parallel short, half rollers  Copyright  VHI. All rights reserved.  ____ sessions per day.  http://[email or web address].exer.us/258   Copyright  VHI. All rights reserved.

## 2016-05-02 NOTE — Therapy (Signed)
[place], [place], [address] Phone: [phone]   Fax:  [phone]  Physical Therapy Treatment  Patient Details  Name: Betty [place] MRN: [id number] Date of Birth: 1956-09-17 Referring Provider: Hart Robinsons   Encounter Date: 05/02/2016      PT End of Session - 05/02/16 1326    Visit Number 8   Number of Visits 16   Date for PT Re-Evaluation 05/29/16   PT Start Time 0848   PT Stop Time 0930   PT Time Calculation (min) 42 min   Activity Tolerance Patient tolerated treatment well   Behavior During Therapy [name] for tasks assessed/performed      Past Medical History  Diagnosis Date  . Fibromyalgia     No past surgical history on file.  There were no vitals filed for this visit.      Subjective Assessment - 05/02/16 0857    Subjective Later thigh and foot pain continue,  It is tender to touch.  Works with walking barefoot .   Currently in Pain? Yes   Pain Score 6    Pain Location Knee   Pain Orientation Left   Pain Descriptors / Indicators Aching;Shooting   Pain Radiating Towards lateral thigh and foor   Pain Frequency Constant   Aggravating Factors  walking bare floors   Pain Relieving Factors over the counter   Multiple Pain Sites No                         [facility] Adult PT Treatment/Exercise - 05/02/16 0001    Self-Care   Self-Care ADL's;Other Self-Care Comments   ADL's Demonstrated good Techniques, modifications, other options shown in handout. Patient has been doing things that irritate her back,    Other Self-Care Comments  Use of foam roller for lateral thigh .    Knee/Hip Exercises: Stretches   Piriformis Stretch 3 reps;30 seconds  each,2 positions.  HEP   Manual Therapy   Manual therapy comments soft tissue workto IT band, tennis ball,  small foam roller 3 inch,  manual to lateral thigh,  lAble to reproduce radiating symptoms into foot.  Feels better                 PT Education - 05/02/16 1325    Education provided Yes   Education Details ADL ED, Piriformis stretch and use of foam roller   Person(s) Educated Patient   Methods Explanation;Demonstration;Handout;Verbal cues   Comprehension Verbalized understanding;Returned demonstration          PT Short Term Goals - 04/30/16 0951    PT SHORT TERM GOAL #1   Title Pt will demonstrate full pain free knee PROM    Baseline continued pain at end range    Time 4   Period Weeks   Status On-going   PT SHORT TERM GOAL #2   Title Pt will increase L single leg stance to 20 seconds   Baseline 20 seconds tofday without difficulty    Time 4   Period Weeks   Status Achieved   PT SHORT TERM GOAL #3   Title Pt will demsotrate 5/5 gross B LE strength    Baseline not tested    Time 4   Period Weeks   Status On-going   PT SHORT TERM GOAL #4   Title Pt will report 2/10 pain at worst   Baseline 6-7/10 over the weekend 3/210 today  PT Long Term Goals - 04/30/16 0953    PT LONG TERM GOAL #1   Title Pt will ambulate a mile without significant pain in order to perform main form of exercise    Baseline ambualting but having pain    Time 8   Period Weeks   Status On-going   PT LONG TERM GOAL #2   Title Pt will kneel for 35 minutes without self reported pain in order to perform work tasks.    Baseline still unbable    Time 8   Period Weeks   PT LONG TERM GOAL #3   Title Pt will go up and down 6 steps with reciprocal gait pattern in order to go in and out of her house safely    Baseline performing 8 inch step but with pain   Period Weeks   Status On-going               Plan - 05/02/16 1326    Clinical Impression Statement Education for ADL's helpful for knee and back.  Progress toward home exercise goal with stretches. Manual helpful with lateral leg pain, able to reproduce leg/foot symptoms.    PT Next Visit Plan Back Eval.  answer any ADL questions.     PT  Home Exercise Plan piriformis   Consulted and Agree with Plan of Care Patient      Patient will benefit from skilled therapeutic intervention in order to improve the following deficits and impairments:  Decreased mobility, Difficulty walking, Decreased range of motion, Decreased endurance, Increased edema, Pain, Hypermobility  Visit Diagnosis: Stiffness of left knee, not elsewhere classified  Pain in left knee  Difficulty in walking, not elsewhere classified     Problem List Patient Active Problem List   Diagnosis Date Noted  . Fibromyalgia     Ger Nicks 05/02/2016, 1:30 PM  [facility] [address] [place], [place], [address] Phone: [phone]   Fax:  [phone]  Name: Betty [place] MRN: [id number] Date of Birth: 1956-01-11    Melvenia Needles, PTA 05/02/2016 1:30 PM Phone: [phone] Fax: [phone]

## 2016-05-07 ENCOUNTER — Ambulatory Visit: Payer: Medicare Other | Admitting: Physical Therapy

## 2016-05-07 DIAGNOSIS — R262 Difficulty in walking, not elsewhere classified: Secondary | ICD-10-CM

## 2016-05-07 DIAGNOSIS — M25562 Pain in left knee: Secondary | ICD-10-CM

## 2016-05-07 DIAGNOSIS — M25662 Stiffness of left knee, not elsewhere classified: Secondary | ICD-10-CM

## 2016-05-07 NOTE — Patient Instructions (Signed)
Achilles / Gastroc, Standing    Stand, right foot behind, heel on floor and turned slightly out, leg straight, forward leg bent. Move hips forward. Hold _30__ seconds. Repeat _3_ times per session. Do __1 _ sessions per day.  Copyright  VHI. All rights reserved.   

## 2016-05-07 NOTE — Therapy (Signed)
[place], [place], [address] Phone: [phone]   Fax:  [phone]  Physical Therapy Treatment  Patient Details  Name: CHANDLER MANG MRN: [id number] Date of Birth: 07/13/1956 Referring Provider: Hart Robinsons   Encounter Date: 05/07/2016      PT End of Session - 05/07/16 1753    Visit Number 9   Number of Visits 16   Date for PT Re-Evaluation 05/29/16   PT Start Time 0850   PT Stop Time 0930   PT Time Calculation (min) 40 min   Activity Tolerance Patient tolerated treatment well   Behavior During Therapy [facility] for tasks assessed/performed      Past Medical History  Diagnosis Date  . Fibromyalgia     No past surgical history on file.  There were no vitals filed for this visit.      Subjective Assessment - 05/07/16 0853    Subjective I took naprosynsodium over the weekend and my knees felt better than ever.  Knees sore today  3/10   Currently in Pain? Yes   Pain Score 3    Pain Location Knee   Pain Orientation Right   Pain Descriptors / Indicators Aching;Shooting   Pain Radiating Towards medial thigh,  lateral leg   Pain Frequency Constant   Aggravating Factors  walking   Pain Relieving Factors naproxim sodium,     Multiple Pain Sites --  back 2-3/10  I can walk OK                         [facility] Adult PT Treatment/Exercise - 05/07/16 0001    Self-Care   Other Self-Care Comments  YOGA stretches observed with reccomendations to do or not to do  for knee.    Knee/Hip Exercises: Stretches   Piriformis Stretch 2 reps;20 seconds  Passive.  less tightness noted.    Gastroc Stretch 3 reps;30 seconds  incline board, HEP   Knee/Hip Exercises: Aerobic   Recumbent Bike 5 minutes   Knee/Hip Exercises: Standing   Hip Abduction 1 set;10 reps   Hip Extension 10 reps;1 set  3 poiunds, cues   Knee/Hip Exercises: Seated   Long Arc Quad 10 reps  10 second hold 10 X AAto  extension   Sit to [employer] 5 reps  cues                PT Education - 05/07/16 1749    Education provided Yes   Education Details gastroc achilles, standing   Person(s) Educated Patient   Methods Explanation;Demonstration;Verbal cues;Handout   Comprehension Verbalized understanding;Returned demonstration          PT Short Term Goals - 05/07/16 1755    PT SHORT TERM GOAL #1   Title Pt will demonstrate full pain free knee PROM    Baseline continued pain at end range    Time 4   Period Weeks   Status On-going   PT SHORT TERM GOAL #2   Title Pt will increase L single leg stance to 20 seconds   Status Achieved   PT SHORT TERM GOAL #3   Title Pt will demsotrate 5/5 gross B LE strength    Baseline not tested    Time 4   Period Weeks   Status Unable to assess   PT SHORT TERM GOAL #4   Title Pt will report 2/10 pain at worst   Baseline less pain over weekend, not sure if  it is consistant.   Time 4   Period Weeks   Status On-going           PT Long Term Goals - 04/30/16 0953    PT LONG TERM GOAL #1   Title Pt will ambulate a mile without significant pain in order to perform main form of exercise    Baseline ambualting but having pain    Time 8   Period Weeks   Status On-going   PT LONG TERM GOAL #2   Title Pt will kneel for 35 minutes without self reported pain in order to perform work tasks.    Baseline still unbable    Time 8   Period Weeks   PT LONG TERM GOAL #3   Title Pt will go up and down 6 steps with reciprocal gait pattern in order to go in and out of her house safely    Baseline performing 8 inch step but with pain   Period Weeks   Status On-going               Plan - 05/07/16 1753    Clinical Impression Statement Pain and function improving over the weekend.  Progress toward her home exercise goals.  Patient needs cues to stay on task.     PT Next Visit Plan Back eval.     PT Home Exercise Plan gastroc stretch   Consulted and Agree  with Plan of Care Patient      Patient will benefit from skilled therapeutic intervention in order to improve the following deficits and impairments:  Decreased mobility, Difficulty walking, Decreased range of motion, Decreased endurance, Increased edema, Pain, Hypermobility  Visit Diagnosis: Stiffness of left knee, not elsewhere classified  Pain in left knee  Difficulty in walking, not elsewhere classified     Problem List Patient Active Problem List   Diagnosis Date Noted  . Fibromyalgia     HARRIS,KAREN 05/07/2016, 5:58 PM  [facility] [address] [place], [place], [address] Phone: [phone]   Fax:  [phone]  Name: ABI BAAS MRN: [id number] Date of Birth: 23-Sep-1956    Melvenia Needles, PTA 05/07/2016 5:58 PM Phone: [phone] Fax: [phone]

## 2016-05-08 ENCOUNTER — Encounter: Payer: Medicare Other | Admitting: Physical Therapy

## 2016-05-09 ENCOUNTER — Ambulatory Visit: Payer: Medicare Other

## 2016-05-09 DIAGNOSIS — M25662 Stiffness of left knee, not elsewhere classified: Secondary | ICD-10-CM

## 2016-05-09 DIAGNOSIS — R262 Difficulty in walking, not elsewhere classified: Secondary | ICD-10-CM

## 2016-05-09 DIAGNOSIS — M25562 Pain in left knee: Secondary | ICD-10-CM

## 2016-05-09 NOTE — Patient Instructions (Addendum)
Hip Extension: Hamstring Single Leg Deadlift (Eccentric)   Holding weights, stand on affected leg with knee slightly flexed. Lift other leg while slowly bending forward at the hip. Use ___ lb weight. ___ reps per set, ___ sets per day, ___ days per week. Add ___ lbs when you achieve ___ repetitions. Touch floor with weight.  Copyright  VHI. All rights reserved.  Squat: Wide Leg  Copyright  VHI. All rights reserved.  Squat: Half   Arms hanging at sides, squat by dropping hips back as if sitting on a chair. Keep knees over ankles, hips behind heels.  Keep back straight.  Bend at hips and knees will bend with hips.   Repeat ___3-15_ times per set. Do __1-2__ sets per session. Do __2-5__ sessions per week. Use __0-10HIP / KNEE: Flexion / Extension, Squat Unilateral Hip / Glute Extension: Standing - Straight Leg (Machine) Hip Extension (Standing) Healthy Back - Yoga Tree Balance

## 2016-05-09 NOTE — Therapy (Signed)
[place], [place], [address] Phone: [phone]   Fax:  [phone]  Physical Therapy Treatment  Patient Details  Name: ANGELIYAH DALUZ MRN: [id number] Date of Birth: 1956/10/29 Referring Provider: Hart Robinsons   Encounter Date: 05/09/2016      PT End of Session - 05/09/16 0859    Visit Number 10   Number of Visits 16   Date for PT Re-Evaluation 05/29/16   PT Start Time 0850   PT Stop Time 0935   PT Time Calculation (min) 45 min   Activity Tolerance Patient tolerated treatment well   Behavior During Therapy [facility] for tasks assessed/performed      Past Medical History  Diagnosis Date  . Fibromyalgia     No past surgical history on file.  There were no vitals filed for this visit.      Subjective Assessment - 05/09/16 0854    Subjective She reports knee feels alright but had pain last night. I vacuumed and knelt for a short time on that knee. Meds help   Currently in Pain? Yes   Pain Score 2    Pain Location Knee   Pain Orientation Right   Pain Descriptors / Indicators Aching;Nagging   Pain Type Surgical pain   Pain Onset More than a month ago   Pain Frequency Constant   Multiple Pain Sites No                         [facility] Adult PT Treatment/Exercise - 05/09/16 0856    Knee/Hip Exercises: Stretches   Gastroc Stretch Right;Left;1 rep;30 seconds   Gastroc Stretch Limitations cued for posture /alighnment of body   Knee/Hip Exercises: Aerobic   Recumbent Bike L3 5 min   Knee/Hip Exercises: Standing   Heel Raises Both;20 reps   Lateral Step Up Right;Step Height: 4";15 reps;Left   Other Standing Knee Exercises x15 stance on RT with LT leg sweeps in semi circle without holding on part of time   Knee/Hip Exercises: Seated   Long Arc Quad 20 reps   Sit to [employer] 15 reps;without UE support                PT Education - 05/09/16 1002    Education provided Yes   Education Details hip hinge single and double   Person(s) Educated Patient   Methods Explanation;Demonstration;Tactile cues;Verbal cues;Handout   Comprehension Returned demonstration;Verbalized understanding          PT Short Term Goals - 05/07/16 1755    PT SHORT TERM GOAL #1   Title Pt will demonstrate full pain free knee PROM    Baseline continued pain at end range    Time 4   Period Weeks   Status On-going   PT SHORT TERM GOAL #2   Title Pt will increase L single leg stance to 20 seconds   Status Achieved   PT SHORT TERM GOAL #3   Title Pt will demsotrate 5/5 gross B LE strength    Baseline not tested    Time 4   Period Weeks   Status Unable to assess   PT SHORT TERM GOAL #4   Title Pt will report 2/10 pain at worst   Baseline less pain over weekend, not sure if it is consistant.   Time 4   Period Weeks   Status On-going           PT Long Term Goals -  04/30/16 0953    PT LONG TERM GOAL #1   Title Pt will ambulate a mile without significant pain in order to perform main form of exercise    Baseline ambualting but having pain    Time 8   Period Weeks   Status On-going   PT LONG TERM GOAL #2   Title Pt will kneel for 35 minutes without self reported pain in order to perform work tasks.    Baseline still unbable    Time 8   Period Weeks   PT LONG TERM GOAL #3   Title Pt will go up and down 6 steps with reciprocal gait pattern in order to go in and out of her house safely    Baseline performing 8 inch step but with pain   Period Weeks   Status On-going               Plan - 24-May-2016 0900    Clinical Impression Statement She had some mild incr in knee pain after exercise but she massaged the sorenessnout and declined cold and was to walk home. She die  the hip hinge bette after uncreasing heigh if surface and closer to cabinet.   It appears she could benefit from incr hip strength    PT Next Visit Plan Continue knee rehab, back eval ?    Consulted and  Agree with Plan of Care Patient      Patient will benefit from skilled therapeutic intervention in order to improve the following deficits and impairments:  Decreased mobility, Difficulty walking, Decreased range of motion, Decreased endurance, Increased edema, Pain, Hypermobility  Visit Diagnosis: Stiffness of left knee, not elsewhere classified  Pain in left knee  Difficulty in walking, not elsewhere classified       G-Codes - 05/24/16 1006    Functional Assessment Tool Used clinical decision making,    Functional Limitation Mobility: Walking and moving around   Mobility: Walking and Moving Around Current Status [id number]) At least 20 percent but less than 40 percent impaired, limited or restricted   Mobility: Walking and Moving Around Goal Status [phone]) At least 1 percent but less than 20 percent impaired, limited or restricted      Problem List Patient Active Problem List   Diagnosis Date Noted  . Fibromyalgia     Darrel Hoover  PT 05/24/16, 10:09 AM  [facility] [address] [place], [place], [address] Phone: [phone]   Fax:  [phone]  Name: NAA CLAPSADDLE MRN: [id number] Date of Birth: [birth year]

## 2016-05-10 ENCOUNTER — Encounter: Payer: Medicare Other | Admitting: Physical Therapy

## 2016-05-21 ENCOUNTER — Ambulatory Visit: Payer: Medicare Other | Admitting: Physical Therapy

## 2016-05-21 DIAGNOSIS — M25662 Stiffness of left knee, not elsewhere classified: Secondary | ICD-10-CM

## 2016-05-21 DIAGNOSIS — M25562 Pain in left knee: Secondary | ICD-10-CM

## 2016-05-21 DIAGNOSIS — R262 Difficulty in walking, not elsewhere classified: Secondary | ICD-10-CM

## 2016-05-21 NOTE — Therapy (Signed)
[place], [place], [address] Phone: [phone]   Fax:  [phone]  Physical Therapy Treatment  Patient Details  Name: Betty Holmes MRN: [id number] Date of Birth: 02-Sep-1956 Referring Provider: Hart Robinsons   Encounter Date: 05/21/2016      PT End of Session - 05/21/16 1130    Visit Number 11   Number of Visits 16   Date for PT Re-Evaluation 05/29/16   Authorization Type medicare/ medicaid    Authorization Time Period KX modifier at 15 visits    PT Start Time 1114   PT Stop Time 1145   PT Time Calculation (min) 31 min   Activity Tolerance Patient tolerated treatment well   Behavior During Therapy [facility] for tasks assessed/performed      Past Medical History  Diagnosis Date  . Fibromyalgia     No past surgical history on file.  There were no vitals filed for this visit.      Subjective Assessment - 05/21/16 1125    Subjective Patient reports her knee has been getting better. The patient was 14 minutes late for her appointment today which did not allow time for her lower back evaluation. She has been kneeling at her temple without significant pain.    Pertinent History Pt has a long histroy of poping and pain in her knee. 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 She had a medial and lateral menical debirdement and syneevectomy on 03-21-16. Since that point she has been unable to kneell and to walk  community distances.    How long can you sit comfortably? she can sit for about a half an hour    How long can you stand comfortably? patient can stand comfortably for 15 min    How long can you walk comfortably? 15 minutes befoire the knee starts to hurt.    Diagnostic tests nothing post op    Patient Stated Goals to be able to walk again    Pain Score 3    Pain Location Knee   Pain Orientation Right   Pain Descriptors / Indicators Aching;Nagging   Pain  Type Surgical pain   Pain Radiating Towards medial thigh/ lateral leg    Pain Onset More than a month ago   Pain Relieving Factors naproxin   Effect of Pain on Daily Activities Difffiuclty perfroming work tasks   Multiple Pain Sites No                         [facility] Adult PT Treatment/Exercise - 05/21/16 0001    Exercises   Exercises Lumbar   Lumbar Exercises: Stretches   Passive Hamstring Stretch Limitations 2x30sec bilateral    Piriformis Stretch 2 reps;20 seconds  Passive.  less tightness noted.    Lumbar Exercises: Supine   Clam Limitations red 2x10    Bent Knee Raise Limitations 2x10   Knee/Hip Exercises: Stretches   Hip Flexor Stretch Limitations 2x20sec    Other Knee/Hip Stretches single knee to chest stretch 3x20sec    Knee/Hip Exercises: Aerobic   Recumbent Bike L3 3 min   Knee/Hip Exercises: Standing   Hip Abduction 1 set;10 reps                PT Education - 05/21/16 1130    Education Details reivewed lower extremity stretching with the patient.    Person(s) Educated Patient   Methods Demonstration;Tactile cues;Explanation;Verbal  cues   Comprehension Verbalized understanding;Returned demonstration          PT Short Term Goals - 05/21/16 1911    PT SHORT TERM GOAL #1   Title Pt will demonstrate full pain free knee PROM    Baseline continued pain at end range    Time 4   Period Weeks   PT SHORT TERM GOAL #2   Title Pt will increase L single leg stance to 20 seconds   Baseline 20 seconds tofday without difficulty    Time 4   Period Weeks   Status Achieved   PT SHORT TERM GOAL #3   Title Pt will demsotrate 5/5 gross B LE strength    Baseline not tested    Time 4   Period Weeks   Status Unable to assess   PT SHORT TERM GOAL #4   Title Pt will report 2/10 pain at worst   Baseline 3/10 pain today    Time 4   Period Weeks   Status On-going           PT Long Term Goals - 04/30/16 [id number]    PT LONG TERM GOAL #1   Title Pt  will ambulate a mile without significant pain in order to perform main form of exercise    Baseline ambualting but having pain    Time 8   Period Weeks   Status On-going   PT LONG TERM GOAL #2   Title Pt will kneel for 35 minutes without self reported pain in order to perform work tasks.    Baseline still unbable    Time 8   Period Weeks   PT LONG TERM GOAL #3   Title Pt will go up and down 6 steps with reciprocal gait pattern in order to go in and out of her house safely    Baseline performing 8 inch step but with pain   Period Weeks   Status On-going        There-ex: stretching provided to improve hip mobility and decrease stress on hips.        Plan - 05/21/16 1320    Clinical Impression Statement Patient was 14 min late for appointment so evaluation for back could not be performed. it will be performed next visit. Therapy did update HEP for hip stretching. Her knee is improving. She reported decreased stiffness after stretching. PT also reviewed 2 knee exercises and reccomeded how they can be turned into core exercises.    Rehab Potential Good   PT Frequency 2x / week   PT Duration 8 weeks   PT Treatment/Interventions ADLs/Self Care Home Management;Electrical Stimulation;Cryotherapy;Ultrasound;Functional mobility training;Therapeutic activities;Therapeutic exercise;Manual techniques;Patient/family education   PT Next Visit Plan Continue knee rehab, back evaluation.    PT Home Exercise Plan gastroc stretch; piriformis stretch, single knee to chest stretch, thomas stretch,    Consulted and Agree with Plan of Care Patient      Patient will benefit from skilled therapeutic intervention in order to improve the following deficits and impairments:  Decreased mobility, Difficulty walking, Decreased range of motion, Decreased endurance, Increased edema, Pain, Hypermobility  Visit Diagnosis: Stiffness of left knee, not elsewhere classified  Pain in left knee  Difficulty in  walking, not elsewhere classified     Problem List Patient Active Problem List   Diagnosis Date Noted  . Fibromyalgia     Carney Living PT DPT  05/21/2016, 7:22 PM  [place]  [place], [place], [address] Phone: [phone]   Fax:  [phone]  Name: Betty Holmes MRN: [id number] Date of Birth: 1956/10/16

## 2016-05-23 ENCOUNTER — Ambulatory Visit: Payer: Medicare Other | Attending: Specialist | Admitting: Physical Therapy

## 2016-05-23 DIAGNOSIS — M25562 Pain in left knee: Secondary | ICD-10-CM | POA: Insufficient documentation

## 2016-05-23 DIAGNOSIS — R262 Difficulty in walking, not elsewhere classified: Secondary | ICD-10-CM | POA: Diagnosis present

## 2016-05-23 DIAGNOSIS — M5442 Lumbago with sciatica, left side: Secondary | ICD-10-CM | POA: Insufficient documentation

## 2016-05-23 DIAGNOSIS — M25662 Stiffness of left knee, not elsewhere classified: Secondary | ICD-10-CM | POA: Insufficient documentation
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
## 2016-05-23 NOTE — Patient Instructions (Signed)
  Patient also given back traction at the sink. No accurate picture available. Pt given picture of regular prayer stretch and therapy explained how to do it at the sink.

## 2016-05-28 ENCOUNTER — Ambulatory Visit: Payer: Medicare Other | Admitting: Physical Therapy

## 2016-05-28 ENCOUNTER — Encounter: Payer: Medicare Other | Admitting: Physical Therapy

## 2016-05-28 DIAGNOSIS — M5442 Lumbago with sciatica, left side: Secondary | ICD-10-CM

## 2016-05-28 DIAGNOSIS — R262 Difficulty in walking, not elsewhere classified: Secondary | ICD-10-CM

## 2016-05-28 DIAGNOSIS — M25662 Stiffness of left knee, not elsewhere classified: Secondary | ICD-10-CM

## 2016-05-28 DIAGNOSIS — M25562 Pain in left knee: Secondary | ICD-10-CM

## 2016-05-29 NOTE — Addendum Note (Signed)
Addended by: Carney Living on: 05/29/2016 05:26 PM   Modules accepted: Orders

## 2016-05-29 NOTE — Therapy (Addendum)
[place], [place], [address] Phone: [phone]   Fax:  [phone]  Physical Therapy Treatment  Patient Details  Name: Betty Holmes MRN: [id number] Date of Birth: 04-26-1956 Referring Provider: Hart Robinsons    Encounter Date: 05/28/2016      PT End of Session - 05/28/16 0906    Visit Number 13   Number of Visits 20   Date for PT Re-Evaluation 06/20/16   Authorization Type medicare/ medicaid    Authorization Time Period KX modifier at 15 visits    PT Start Time [phone]   PT Stop Time 0930   PT Time Calculation (min) 44 min   Activity Tolerance Patient tolerated treatment well   Behavior During Therapy [facility] for tasks assessed/performed      Past Medical History  Diagnosis Date  . Fibromyalgia     No past surgical history on file.  There were no vitals filed for this visit.                       [facility] Adult PT Treatment/Exercise - 05/29/16 0001    Lumbar Exercises: Stretches   Passive Hamstring Stretch Limitations 2x20sec    Lower Trunk Rotation Limitations x10   Piriformis Stretch Limitations 2x20sec    Lumbar Exercises: Supine   Clam Limitations red 2x10    Bent Knee Raise Limitations 2x10   Bridge Limitations 2x10   Straight Leg Raises Limitations 2x10 each   Knee/Hip Exercises: Stretches   Other Knee/Hip Stretches single knee to chest stretch 3x20sec    Knee/Hip Exercises: Aerobic   Recumbent Bike L3 3 min   Knee/Hip Exercises: Standing   Heel Raises Both;20 reps   Hip Abduction 1 set;10 reps   Hip Extension 10 reps;1 set   Functional Squat Limitations 20reps   SLS 3x30sec holds    Knee/Hip Exercises: Seated   Long Arc Quad 20 reps   Long Arc Quad Limitations w/ red band                 PT Education - 05/28/16 0905    Education Details Continue with stretching    Person(s) Educated Patient   Methods Explanation;Demonstration;Verbal cues   Comprehension Verbalized understanding;Returned demonstration          PT Short Term Goals - 05/28/16 0910    PT SHORT TERM GOAL #1   Title Pt will demonstrate full pain free knee PROM    Baseline continued pain at end range    Time 4   Period Weeks   Status On-going   PT SHORT TERM GOAL #2   Title Pt will increase L single leg stance to 20 seconds   Baseline 20 seconds tofday without difficulty    Time 4   Status Achieved   PT SHORT TERM GOAL #3   Title Pt will demsotrate 5/5 gross B LE strength    Baseline not tested    Time 4   Period Weeks   Status On-going   PT SHORT TERM GOAL #4   Title Pt will report 2/10 pain at worst   Baseline 3/10 pain today    Time 4   Period Weeks   Status On-going   PT SHORT TERM GOAL #5   Title Patient will report no radicular pain into her left leg    Baseline pain going into her hip but at itimes down into her thigh   Period Weeks   Status On-going  PT SHORT TERM GOAL #7   Title Patient will report decreased tenderness to palpation in the lower back    Baseline significant tenderness to L paraspinals    Time 4   Period Weeks   Status On-going           PT Long Term Goals - 05/28/16 0913    PT LONG TERM GOAL #1   Title Pt will ambulate a mile without significant pain in order to perform main form of exercise    Baseline very little pain with exercise.   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    Time 8   Period Weeks    Status On-going               Plan - 05/28/16 0906    Clinical Impression Statement Patient is making progress. She continues to have some pain into her leg and in her knee but it is becoming more manageable. Therapy continues to focus on core strengthening and functional knee strengthening. She felt improved pain  with treatment..   Rehab Potential Good   PT Frequency 2x / week   PT Duration 8 weeks   PT Treatment/Interventions ADLs/Self Care Home Management;Electrical Stimulation;Cryotherapy;Ultrasound;Functional mobility training;Therapeutic activities;Therapeutic exercise;Manual techniques;Patient/family education   PT Next Visit Plan Continue knee rehab, back evaluation.    PT Home Exercise Plan gastroc stretch; piriformis stretch, single knee to chest stretch, thomas stretch,    Consulted and Agree with Plan of Care Patient      Patient will benefit from skilled therapeutic intervention in order to improve the following deficits and impairments:  Decreased mobility, Difficulty walking, Decreased range of motion, Decreased endurance, Increased edema, Pain, Hypermobility  Visit Diagnosis: Pain in left knee  Stiffness of left knee, not elsewhere classified  Midline low back pain with left-sided sciatica  Difficulty in walking, not elsewhere classified    PHYSICAL THERAPY DISCHARGE SUMMARY  Visits from Start of Care: 13  Current functional level related to goals / functional outcomes: Did not come to last visit    Remaining deficits: Did not come to last visit   Education / Equipment: Unknown  Plan: Patient agrees to discharge.  Patient goals were not met. Patient is being discharged due to meeting the stated rehab goals.  ?????      Problem List Patient Active Problem List   Diagnosis Date Noted  . Fibromyalgia     Betty Holmes PT DPT  05/29/2016, 4:58 PM  [facility] [address] [place],  [place], [address] Phone: [phone]   Fax:  [phone]  Name: Betty Holmes MRN: [id number] Date of Birth: Sep 07, 1956

## 2016-05-30 ENCOUNTER — Encounter: Payer: Medicare Other | Admitting: Physical Therapy

## 2016-06-04 ENCOUNTER — Encounter: Payer: Medicare Other | Admitting: Physical Therapy

## 2016-06-11 ENCOUNTER — Ambulatory Visit: Payer: Medicare Other | Admitting: Physical Therapy

## 2016-06-13 ENCOUNTER — Ambulatory Visit: Payer: Medicare Other | Admitting: Physical Therapy

## 2017-03-24 ENCOUNTER — Encounter (HOSPITAL_COMMUNITY): Payer: Self-pay

## 2017-03-24 ENCOUNTER — Emergency Department (HOSPITAL_COMMUNITY)
Admission: EM | Admit: 2017-03-24 | Discharge: 2017-03-24 | Disposition: A | Discharge status: Home / Self Care | Payer: Medicare Other | Attending: Emergency Medicine | Admitting: Emergency Medicine

## 2017-03-24 ENCOUNTER — Telehealth: Payer: Self-pay | Admitting: Nurse Practitioner

## 2017-03-24 ENCOUNTER — Emergency Department (HOSPITAL_COMMUNITY): Payer: Medicare Other

## 2017-03-24 DIAGNOSIS — L728 Other follicular cysts of the skin and subcutaneous tissue: Secondary | ICD-10-CM | POA: Insufficient documentation

## 2017-03-24 DIAGNOSIS — L720 Epidermal cyst: Secondary | ICD-10-CM

## 2017-03-24 DIAGNOSIS — R1909 Other intra-abdominal and pelvic swelling, mass and lump: Secondary | ICD-10-CM | POA: Diagnosis not present

## 2017-03-24 DIAGNOSIS — L729 Follicular cyst of the skin and subcutaneous tissue, unspecified: Secondary | ICD-10-CM | POA: Diagnosis not present

## 2017-03-24 LAB — COMPREHENSIVE METABOLIC PANEL
ALT: 15 U/L (ref 14–54)
AST: 24 U/L (ref 15–41)
Albumin: 3.9 g/dL (ref 3.5–5.0)
Alkaline Phosphatase: 49 U/L (ref 38–126)
Anion gap: 6 (ref 5–15)
BUN: 12 mg/dL (ref 6–20)
CO2: 28 mmol/L (ref 22–32)
Calcium: 9.1 mg/dL (ref 8.9–10.3)
Chloride: 104 mmol/L (ref 101–111)
Creatinine, Ser: 0.62 mg/dL (ref 0.44–1.00)
GFR calc Af Amer: >60 mL/min (ref >60)
GFR calc non Af Amer: >60 mL/min (ref >60)
Glucose, Bld: 95 mg/dL (ref 65–99)
Potassium: 4.2 mmol/L (ref 3.5–5.1)
Sodium: 138 mmol/L (ref 135–145)
Total Bilirubin: 0.6 mg/dL (ref 0.3–1.2)
Total Protein: 6.5 g/dL (ref 6.5–8.1)

## 2017-03-24 LAB — URINALYSIS, ROUTINE W REFLEX MICROSCOPIC
Bilirubin Urine: NEGATIVE
Glucose, UA: NEGATIVE mg/dL
Ketones, ur: NEGATIVE mg/dL
Leukocytes, UA: NEGATIVE
Nitrite: POSITIVE — AB
Protein, ur: NEGATIVE mg/dL
Specific Gravity, Urine: 1.012 (ref 1.005–1.030)
pH: 5 (ref 5.0–8.0)

## 2017-03-24 LAB — CBC WITH DIFFERENTIAL/PLATELET
Basophils Absolute: 0 10*3/uL (ref 0.0–0.1)
Basophils Relative: 1 %
Eosinophils Absolute: 0.1 10*3/uL (ref 0.0–0.7)
Eosinophils Relative: 2 %
HCT: 41.7 % (ref 36.0–46.0)
Hemoglobin: 14.3 g/dL (ref 12.0–15.0)
Lymphocytes Relative: 42 %
Lymphs Abs: 1.9 10*3/uL (ref 0.7–4.0)
MCH: 29.9 pg (ref 26.0–34.0)
MCHC: 34.3 g/dL (ref 30.0–36.0)
MCV: 87.2 fL (ref 78.0–100.0)
Monocytes Absolute: 0.3 10*3/uL (ref 0.1–1.0)
Monocytes Relative: 6 %
Neutro Abs: 2.3 10*3/uL (ref 1.7–7.7)
Neutrophils Relative %: 49 %
Platelets: 199 10*3/uL (ref 150–400)
RBC: 4.78 MIL/uL (ref 3.87–5.11)
RDW: 13 % (ref 11.5–15.5)
WBC: 4.6 10*3/uL (ref 4.0–10.5)

## 2017-03-24 LAB — I-STAT CG4 LACTIC ACID, ED: Lactic Acid, Venous: 0.79 mmol/L (ref 0.5–1.9)

## 2017-03-24 MED ORDER — CLINDAMYCIN HCL 300 MG PO CAPS: 300.0000 mg | ORAL_CAPSULE | Freq: Four times a day (QID) | ORAL | 0 refills | Status: DC

## 2017-03-24 MED ORDER — IOPAMIDOL (ISOVUE-300) INJECTION 61%
INTRAVENOUS | Status: AC
  Administered 2017-03-24: 100 mL
  Filled 2017-03-24: qty 100

## 2017-03-24 MED ORDER — IBUPROFEN 600 MG PO TABS: 600.0000 mg | ORAL_TABLET | Freq: Four times a day (QID) | ORAL | 0 refills | Status: DC | PRN

## 2017-03-24 NOTE — ED Triage Notes (Signed)
Pt states she has had cyst in her groin X2 years. Pt states last night the cyst became very painful. Pt reports intermittent pain X2 years.

## 2017-03-24 NOTE — ED Notes (Signed)
Pt has R area that MD palpated believed to be hernia. Very tender to palpation.

## 2017-03-24 NOTE — ED Notes (Signed)
Pt ambulated to restroom without distress.  

## 2017-03-24 NOTE — ED Provider Notes (Signed)
[facility] DEPT Provider Note   CSN: [id number] Arrival date & time: 03/24/17  4174     History   Chief Complaint Chief Complaint  Patient presents with  . Cyst    HPI Betty Holmes is a 61 y.o. female.  HPI Patient presents with right groin mass and pain for the past 2 years. States has worsened over the last 2 weeks. Has had associated nausea but no vomiting or constipation. States the mass in largest at times. She has not tried to reduce the mass. Denies any urinary symptoms. No fever or chills. Past Medical History:  Diagnosis Date  . Fibromyalgia     Patient Active Problem List   Diagnosis Date Noted  . Fibromyalgia     History reviewed. No pertinent surgical history.  OB History    No data available       Home Medications    Prior to Admission medications   Medication Sig Start Date End Date Taking? Authorizing Provider  FLUoxetine (PROZAC) 40 MG capsule Take 40 mg by mouth daily.   Yes Historical Provider, MD  clindamycin (CLEOCIN) 300 MG capsule Take 1 capsule (300 mg total) by mouth 4 (four) times daily. X 7 days 03/24/17   Julianne Rice, MD  ibuprofen (ADVIL,MOTRIN) 600 MG tablet Take 1 tablet (600 mg total) by mouth every 6 (six) hours as needed for moderate pain. 03/24/17   Julianne Rice, MD    Family History History reviewed. No pertinent family history.  Social History Social History  Substance Use Topics  . Smoking status: Never Smoker  . Smokeless tobacco: Never Used  . Alcohol use No     Allergies   Penicillins   Review of Systems Review of Systems  Constitutional: Negative for chills and fever.  Respiratory: Negative for shortness of breath.   Cardiovascular: Negative for chest pain.  Gastrointestinal: Positive for nausea. Negative for abdominal pain, constipation, diarrhea and vomiting.  Genitourinary: Negative for dysuria.  Musculoskeletal: Negative for back pain.  Skin: Negative for rash and wound.  All other systems  reviewed and are negative.    Physical Exam Updated Vital Signs BP 112/73   Pulse 64   Temp 98.2 F (36.8 C) (Oral)   Resp 18   SpO2 99%   Physical Exam  Constitutional: She is oriented to person, place, and time. She appears well-developed and well-nourished. No distress.  HENT:  Head: Normocephalic and atraumatic.  Mouth/Throat: Oropharynx is clear and moist.  Eyes: EOM are normal. Pupils are equal, round, and reactive to light.  Neck: Normal range of motion. Neck supple.  Cardiovascular: Normal rate and regular rhythm.   Pulmonary/Chest: Effort normal and breath sounds normal.  Abdominal: Soft. Bowel sounds are normal. There is tenderness. There is no rebound and no guarding. A hernia is present.  Patient has tender mass in the right inguinal region.  Musculoskeletal: Normal range of motion. She exhibits no edema or tenderness.  Neurological: She is alert and oriented to person, place, and time.  Skin: Skin is warm and dry. Capillary refill takes less than 2 seconds. No rash noted. No erythema.  Psychiatric: She has a normal mood and affect. Her behavior is normal.  Nursing note and vitals reviewed.    ED Treatments / Results  Labs (all labs ordered are listed, but only abnormal results are displayed) Labs Reviewed  URINALYSIS, ROUTINE W REFLEX MICROSCOPIC - Abnormal; Notable for the following:       Result Value   Hgb urine dipstick  SMALL (*)    Nitrite POSITIVE (*)    Bacteria, UA RARE (*)    Squamous Epithelial / LPF 0-5 (*)    All other components within normal limits  CBC WITH DIFFERENTIAL/PLATELET  COMPREHENSIVE METABOLIC PANEL  I-STAT CG4 LACTIC ACID, ED    EKG  EKG Interpretation None       Radiology Ct Abdomen Pelvis W Contrast  Result Date: 03/24/2017 CLINICAL DATA:  Painful right inguinal lump, which has been present and intermittently painful for 2 years, with new pain last night. EXAM: CT ABDOMEN AND PELVIS WITH CONTRAST TECHNIQUE:  Multidetector CT imaging of the abdomen and pelvis was performed using the standard protocol following bolus administration of intravenous contrast. CONTRAST:  120mL ISOVUE-300 IOPAMIDOL (ISOVUE-300) INJECTION 61% COMPARISON:  None. FINDINGS: Lower chest: No significant pulmonary nodules or acute consolidative airspace disease. Hepatobiliary: Normal liver size. Two scattered subcentimeter hypodense liver lesions are too small to characterize and require no further follow up unless the patient has risk factors for liver malignancy. No additional liver lesions. Normal gallbladder with no radiopaque cholelithiasis. No biliary ductal dilatation. Pancreas: Normal, with no mass or duct dilation. Spleen: Normal size. No mass. Adrenals/Urinary Tract: Normal adrenals. Normal kidneys with no hydronephrosis and no renal mass. Normal bladder. Stomach/Bowel: Small hiatal hernia. Otherwise collapsed and grossly normal stomach. Normal caliber small bowel with no small bowel wall thickening. Normal appendix. Mild sigmoid diverticulosis, with no large bowel wall thickening or pericolonic fat stranding. Vascular/Lymphatic: Normal caliber abdominal aorta. Patent portal, splenic, hepatic and renal veins. No pathologically enlarged lymph nodes in the abdomen or pelvis. Reproductive: Grossly normal uterus.  No adnexal mass. Other: No pneumoperitoneum. No ascites. No intraperitoneal fluid collections. There is a 2.7 x 2.1 x 2.4 cm cystic structure in the subcutaneous right inguinal region (series 3/image 71), which demonstrates minimal wall thickening and slight haziness of the surrounding fat, with no appreciable solid component or internal gas. No appreciable inguinal or femoral hernia. Musculoskeletal: No aggressive appearing focal osseous lesions. Moderate thoracolumbar spondylosis. IMPRESSION: 1. Right inguinal subcutaneous 2.7 x 2.1 x 2.4 cm cystic structure with minimal wall thickening and slight haziness of the surrounding fat,  which may indicate an inflamed or infected cyst. No evidence of an inguinal or femoral hernia. 2. Otherwise no acute abnormality. No evidence of bowel obstruction or acute bowel inflammation. Normal appendix. 3. Small hiatal hernia. Electronically Signed   By: Ilona Sorrel M.D.   On: 03/24/2017 13:53    Procedures Procedures (including critical care time)  Medications Ordered in ED Medications  iopamidol (ISOVUE-300) 61 % injection (100 mLs  Contrast Given 03/24/17 1319)     Initial Impression / Assessment and Plan / ED Course  I have reviewed the triage vital signs and the nursing notes.  Pertinent labs & imaging results that were available during my care of the patient were reviewed by me and considered in my medical decision making (see chart for details).     2 cm cyst seen on CT in right inguinal region. There is mild inflammatory changes around the wall of the cyst. She is very comfortable. Normal white blood cell count. Afebrile. Will start on antibiotics and have follow-up with general surgery.  Final Clinical Impressions(s) / ED Diagnoses   Final diagnoses:  Cyst of skin and subcutaneous tissue    New Prescriptions New Prescriptions   CLINDAMYCIN (CLEOCIN) 300 MG CAPSULE    Take 1 capsule (300 mg total) by mouth 4 (four) times daily. X 7 days  IBUPROFEN (ADVIL,MOTRIN) 600 MG TABLET    Take 1 tablet (600 mg total) by mouth every 6 (six) hours as needed for moderate pain.     Julianne Rice, MD 03/24/17 1501

## 2017-03-24 NOTE — ED Notes (Signed)
Patient transported to CT 

## 2017-03-24 NOTE — Telephone Encounter (Signed)
Patient called requesting to be worked in sooner bc of health issues.  Patient states she is in a lot of pain from a cyst.  I did tell patient to head to ED for today.

## 2017-04-01 ENCOUNTER — Ambulatory Visit (INDEPENDENT_AMBULATORY_CARE_PROVIDER_SITE_OTHER): Payer: Medicare Other | Admitting: Nurse Practitioner

## 2017-04-01 ENCOUNTER — Encounter: Payer: Self-pay | Admitting: Nurse Practitioner

## 2017-04-01 VITALS — BP 110/70 | HR 61 | Temp 97.8°F | Ht 63.0 in | Wt 169.0 lb

## 2017-04-01 DIAGNOSIS — M797 Fibromyalgia: Secondary | ICD-10-CM

## 2017-04-01 DIAGNOSIS — F3341 Major depressive disorder, recurrent, in partial remission: Secondary | ICD-10-CM

## 2017-04-01 MED ORDER — FLUOXETINE HCL 40 MG PO CAPS: 40.0000 mg | ORAL_CAPSULE | Freq: Every day | ORAL | 3 refills | Status: DC

## 2017-04-01 MED ORDER — PREGABALIN 25 MG PO CAPS: 25.0000 mg | ORAL_CAPSULE | Freq: Two times a day (BID) | ORAL | 0 refills | Status: DC

## 2017-04-01 NOTE — Progress Notes (Signed)
Subjective:    Patient ID: Betty Holmes, female    DOB: [birth year], 61 y.o.   MRN: [id number]  Patient presents today for establish care (new patient)  HPI  [facility]. Last seen 10/2016.   moved here from [place] and [place]  Cervical spinal stenosis: neck, hand pain and weakness.  Chronic generalized pain: previous use of lyrica with some help. She will like to return to medication.  Depression: Stable with prozac. Needs medication refilled.   Immunizations: (TDAP, Hep C screen, Pneumovax, Influenza, zoster)  Health Maintenance  Topic Date Due  .  Hepatitis C: One time screening is recommended by Center for Disease Control  (CDC) for  adults born from [birth year] through 1967.   02/18/1956  . HIV Screening  10/10/1969  . Pap Smear  10/11/1975  . Colon Cancer Screening  10/10/2004  . Flu Shot  07/23/2017  . Mammogram  12/10/2017  . Tetanus Vaccine  04/22/2025   Diet:regular Weight:  Wt Readings from Last 3 Encounters:  04/01/17 169 lb (76.7 kg)   Exercise: walking Fall Risk: Fall Risk  04/01/2017  Falls in the past year? No   Home Safety:home  Depression/Suicide: Depression screen [facility] 2/9 04/01/2017  Decreased Interest 3  Down, Depressed, Hopeless 3  PHQ - 2 Score 6  Altered sleeping 1  Tired, decreased energy 3  Change in appetite 0  Feeling bad or failure about yourself  0  Trouble concentrating 3  Moving slowly or fidgety/restless 0  Suicidal thoughts 0  PHQ-9 Score 13   No flowsheet data found. Colonoscopy (every 5-52yrs, >50-27yrs):last done 2015 (normal per patient) Dexa (every 2-36yrs, >35yrs):positive for osteoporosis Pap Smear (every 28yrs for >21-29 without HPV, every 46yrs for >30-80yrs with HPV): last done 2014 (normal per patient) Advanced Directive: Advanced Directives 03/24/2017  Does Patient Have a Medical Advance Directive? No  Would patient like information on creating a medical advance directive? No -  Patient declined   Sexual History (birth control, marital status, STD):married  Medications and allergies reviewed with patient and updated if appropriate.  Patient Active Problem List   Diagnosis Date Noted  . Fibromyalgia     Current Outpatient Prescriptions on File Prior to Visit  Medication Sig Dispense Refill  . clindamycin (CLEOCIN) 300 MG capsule Take 1 capsule (300 mg total) by mouth 4 (four) times daily. X 7 days (Patient not taking: Reported on 04/01/2017) 28 capsule 0  . ibuprofen (ADVIL,MOTRIN) 600 MG tablet Take 1 tablet (600 mg total) by mouth every 6 (six) hours as needed for moderate pain. (Patient not taking: Reported on 04/01/2017) 30 tablet 0   No current facility-administered medications on file prior to visit.     Past Medical History:  Diagnosis Date  . At high risk for tick borne illness    had abx--test her again--she was okey---at [facility]  . Fibromyalgia   . Fibromyalgia     Past Surgical History:  Procedure Laterality Date  . CESAREAN SECTION    . KNEE SURGERY    . OVARIAN CYST REMOVAL      Social History   Social History  . Marital status: Unknown    Spouse name: N/A  . Number of children: N/A  . Years of education: N/A   Social History Main Topics  . Smoking status: Never Smoker  . Smokeless tobacco: Never Used  . Alcohol use No  . Drug use: No  . Sexual activity: Not Asked  Other Topics Concern  . None   Social History Narrative  . None    Family History  Problem Relation Age of Onset  . Alzheimer's disease Mother   . Heart disease Father   . Alzheimer's disease Maternal Aunt   . Alzheimer's disease Maternal Uncle   . Alzheimer's disease Maternal Grandmother         Review of Systems  Constitutional: Negative for fever, malaise/fatigue and weight loss.  HENT: Negative for congestion and sore throat.   Eyes:       Negative for visual changes  Respiratory: Negative for cough and shortness of breath.     Cardiovascular: Negative for chest pain, palpitations and leg swelling.  Gastrointestinal: Negative for blood in stool, constipation, diarrhea and heartburn.  Genitourinary: Negative for dysuria, frequency and urgency.  Musculoskeletal: Positive for joint pain, myalgias and neck pain. Negative for back pain and falls.  Skin: Negative for rash.  Neurological: Negative for dizziness, sensory change and headaches.  Endo/Heme/Allergies: Does not bruise/bleed easily.  Psychiatric/Behavioral: Positive for depression. Negative for substance abuse and suicidal ideas. The patient is not nervous/anxious and does not have insomnia.     Objective:   Vitals:   04/01/17 1519  BP: 110/70  Pulse: 61  Temp: 97.8 F (36.6 C)    Body mass index is 29.94 kg/m.   Physical Examination:  Physical Exam  Constitutional: She is oriented to person, place, and time and well-developed, well-nourished, and in no distress. No distress.  HENT:  Right Ear: External ear normal.  Left Ear: External ear normal.  Nose: Nose normal.  Mouth/Throat: Oropharynx is clear and moist. No oropharyngeal exudate.  Eyes: Conjunctivae and EOM are normal. Pupils are equal, round, and reactive to light. No scleral icterus.  Neck: Normal range of motion. Neck supple. No thyromegaly present.  Cardiovascular: Normal rate, normal heart sounds and intact distal pulses.   Pulmonary/Chest: Effort normal and breath sounds normal. She exhibits no tenderness.  Abdominal: Soft. Bowel sounds are normal. She exhibits no distension. There is no tenderness.  Musculoskeletal: Normal range of motion. She exhibits edema. She exhibits no tenderness.  (edema is chronic per patient)  Lymphadenopathy:    She has no cervical adenopathy.  Neurological: She is alert and oriented to person, place, and time. Gait normal.  Skin: Skin is warm and dry.  Psychiatric: Affect and judgment normal.    ASSESSMENT and PLAN:  Betty Holmes was seen today for  establish care.  Diagnoses and all orders for this visit:  Recurrent major depressive disorder, in partial remission (HCC) -     FLUoxetine (PROZAC) 40 MG capsule; Take 1 capsule (40 mg total) by mouth daily.  Fibromyalgia -     pregabalin (LYRICA) 25 MG capsule; Take 1 capsule (25 mg total) by mouth 2 (two) times daily.   No problem-specific Assessment & Plan notes found for this encounter.     Follow up: Return in about 1 month (around 05/01/2017) for depression and fibromyalgia (75mis appt, fasting).  Wilfred Lacy, NP

## 2017-04-01 NOTE — Patient Instructions (Addendum)
Please sign medical release form to get record from previous pcp.

## 2017-04-01 NOTE — Progress Notes (Signed)
Pre visit review using our clinic review tool, if applicable. No additional management support is needed unless otherwise documented below in the visit note. 

## 2017-04-07 ENCOUNTER — Encounter: Payer: Self-pay | Admitting: Podiatry

## 2017-04-07 ENCOUNTER — Ambulatory Visit (INDEPENDENT_AMBULATORY_CARE_PROVIDER_SITE_OTHER): Payer: Medicare Other

## 2017-04-07 ENCOUNTER — Ambulatory Visit (INDEPENDENT_AMBULATORY_CARE_PROVIDER_SITE_OTHER): Payer: Medicare Other | Admitting: Podiatry

## 2017-04-07 ENCOUNTER — Telehealth: Payer: Self-pay | Admitting: *Deleted

## 2017-04-07 DIAGNOSIS — M25373 Other instability, unspecified ankle: Secondary | ICD-10-CM

## 2017-04-07 DIAGNOSIS — M79671 Pain in right foot: Secondary | ICD-10-CM

## 2017-04-07 DIAGNOSIS — M79672 Pain in left foot: Secondary | ICD-10-CM

## 2017-04-07 DIAGNOSIS — M19079 Primary osteoarthritis, unspecified ankle and foot: Secondary | ICD-10-CM

## 2017-04-07 DIAGNOSIS — M25571 Pain in right ankle and joints of right foot: Secondary | ICD-10-CM

## 2017-04-07 DIAGNOSIS — G5762 Lesion of plantar nerve, left lower limb: Secondary | ICD-10-CM

## 2017-04-07 DIAGNOSIS — M25572 Pain in left ankle and joints of left foot: Secondary | ICD-10-CM

## 2017-04-07 DIAGNOSIS — D492 Neoplasm of unspecified behavior of bone, soft tissue, and skin: Secondary | ICD-10-CM

## 2017-04-07 MED ORDER — NONFORMULARY OR COMPOUNDED ITEM: 0 refills | Status: DC

## 2017-04-07 NOTE — Telephone Encounter (Addendum)
Pt states she would like to be referred to a Rheumatologist.04/08/2017-I informed pt of Dr. Amalia Hailey orders and gave pt Dr. Gavin Pound [facility] Rheumatology. Pt called states Dr. Trudie Reed is not in-network with her insurance, can use Dr. Bo Merino at [address] [place]. I informed pt I would make the change in the referral.04/14/2017-Dr. Amalia Hailey ordered Hydrocodone 7.5mg  #30 one tablet every 6 hours prn foot pain. 04/15/2017-Arthritic Panel results and referral faxed to Dr. Estanislado Pandy. 05/02/2017-Medicare Denied the Diclofenac Gel 3%. I informed pt and she states her pharmacist stated that if Dr. Amalia Hailey prescribed the 1% Gel then her insurance would cover. Dr. Amalia Hailey states change to Diclofenac 1% Gel. Orders to the [facility]

## 2017-04-08 NOTE — Telephone Encounter (Signed)
Sure.  Dx: DJD/arthritis both upper and lower extremities bilateral.   Also, please order an arthritic panel for patient prior to referral appt. Thanks, Dr. Amalia Hailey

## 2017-04-09 ENCOUNTER — Encounter: Payer: Self-pay | Admitting: Nurse Practitioner

## 2017-04-09 DIAGNOSIS — M545 Low back pain, unspecified: Secondary | ICD-10-CM | POA: Insufficient documentation

## 2017-04-09 DIAGNOSIS — G8929 Other chronic pain: Secondary | ICD-10-CM | POA: Insufficient documentation

## 2017-04-09 DIAGNOSIS — F3341 Major depressive disorder, recurrent, in partial remission: Secondary | ICD-10-CM | POA: Insufficient documentation

## 2017-04-09 NOTE — Progress Notes (Signed)
   Subjective:  Patient presents today for evaluation of a not to the dorsal aspect of her left foot is been going on for several years. Patient states that it is occasionally painful and has grown in size over time. Patient also complains of left toe pain between digits 2 and 3 of the left foot. Patient also has bilateral ankle pain.. Pain is aggravated when walking. Patient denies significant trauma.    Objective/Physical Exam General: The patient is alert and oriented x3 in no acute distress.  Dermatology: Skin is warm, dry and supple bilateral lower extremities. Negative for open lesions or macerations.  Vascular: Palpable pedal pulses bilaterally. No edema or erythema noted. Capillary refill within normal limits.  Neurological: Epicritic and protective threshold grossly intact bilaterally.   Musculoskeletal Exam: Palpable nodule noted to the dorsal aspect of the left foot consistent with a ganglion cyst or soft tissue tumor. The mass does not appear to be adhered to underlying osseous tissue. Pain on palpation also noted to the anterior medial and lateral aspects of the patient's bilateral ankle joints with a positive anterior drawer sign consistent with ankle joint instability. Pain on palpation also noted to the bilateral midfoot consistent with the midfoot DJD/arthritis. Pain on palpation also noted with compression of the second interspace left foot and lateral compression of the metatarsal heads consistent with a positive Sullivan sign.  Radiographic Exam:  Normal osseous mineralization. Joint spaces preserved. No fracture/dislocation/boney destruction.    Assessment: #1 soft tissue tumor/mass left foot #2 bilateral ankle joint instability #3 bilateral midfoot arthritis/DJD #4 neuroma/neuritis second interspace left foot   Plan of Care:  #1 Patient was evaluated. X-rays were reviewed today #2 today we are going to initiate authorization for surgery for excision of the soft  tissue mass left foot. Surgery will consist of excision of soft tissue mass left foot. Surgery will be performed here in the office. #3 bilateral ankle braces were dispensed #4 prescription for anti-inflammatory pain cream dispensed through [place] #5 today we are going to order an arthritic panel for the patient and consult for rheumatologist due to chronic arthritic changes throughout the body. #6 return to clinic in the morning of surgery and 1 week postop   Edrick Kins, DPM [facility]  Dr. Edrick Kins, [address]                                        [place], [place] [address]                Office [phone]  Fax [phone]

## 2017-04-09 NOTE — Telephone Encounter (Signed)
Note dictated. Sorry for the delay. Thanks, Dr. Amalia Hailey

## 2017-04-10 ENCOUNTER — Ambulatory Visit: Payer: Self-pay | Admitting: Surgery

## 2017-04-10 DIAGNOSIS — L729 Follicular cyst of the skin and subcutaneous tissue, unspecified: Secondary | ICD-10-CM | POA: Diagnosis not present

## 2017-04-10 NOTE — H&P (Signed)
Betty Holmes [facility] 04/10/2017 9:15 AM Location: [facility] Patient #: [id number] DOB: 1956/02/02 Separated / Language: [name] / Race: White Female  History of Present Illness (Dwanda Tufano A. Kae Heller MD; 04/10/2017 9:27 AM) Patient words: Right inguinal cyst which has been present for 2 years. Has never drained or had any procedures, but will increase in size from time to time and then go back down. It is painful to her. THe pain is sharp and radiates into the right abdomen and labia. irritated by clothing and activity. Had a CT in the ER 3 weeks ago which was completely negative aside from "There is a 2.7 x 2.1 x 2.4 cm cystic structure in the subcutaneous right inguinal region (series 3/image 71), which demonstrates minimal wall thickening and slight haziness of the surrounding fat, with no appreciable solid component or internal gas.".  The patient is a 61 year old female.   Past Surgical History Malachy Moan, [place]; 04/10/2017 9:15 AM) Cesarean Section - 1  Diagnostic Studies History Malachy Moan, RMA; 04/10/2017 9:15 AM) Colonoscopy 1-5 years ago Mammogram 1-3 years ago Pap Smear 1-5 years ago  Allergies Malachy Moan, RMA; 04/10/2017 9:16 AM) Penicillins Hives.  Medication History Malachy Moan, [place]; 04/10/2017 9:16 AM) Lyrica (25MG  Capsule, Oral) Active. FLUoxetine HCl (40MG  Capsule, Oral) Active. Ibuprofen (600MG  Tablet, Oral) Active. Medications Reconciled  Social History Malachy Moan, [place]; 04/10/2017 9:15 AM) Caffeine use Coffee. No alcohol use No drug use Tobacco use Never smoker.  Family History Malachy Moan, [place]; 04/10/2017 9:15 AM) Arthritis Mother. Heart Disease Father.  Pregnancy / Birth History Malachy Moan, [place]; 04/10/2017 9:15 AM) Age at menarche 15 years. Age of menopause 59-60 Gravida 4 Length (months) of breastfeeding >55 Maternal age 48-25 Para 4  Other Problems Malachy Moan, [place];  04/10/2017 9:15 AM) Arthritis Depression     Review of Systems Malachy Moan RMA; 04/10/2017 9:15 AM) General Not Present- Appetite Loss, Chills, Fatigue, Fever, Night Sweats, Weight Gain and Weight Loss. Skin Not Present- Change in Wart/Mole, Dryness, Hives, Jaundice, New Lesions, Non-Healing Wounds, Rash and Ulcer. HEENT Not Present- Earache, Hearing Loss, Hoarseness, Nose Bleed, Oral Ulcers, Ringing in the Ears, Seasonal Allergies, Sinus Pain, Sore Throat, Visual Disturbances, Wears glasses/contact lenses and Yellow Eyes. Respiratory Not Present- Bloody sputum, Chronic Cough, Difficulty Breathing, Snoring and Wheezing. Breast Not Present- Breast Mass, Breast Pain, Nipple Discharge and Skin Changes. Cardiovascular Not Present- Chest Pain, Difficulty Breathing Lying Down, Leg Cramps, Palpitations, Rapid Heart Rate, Shortness of Breath and Swelling of Extremities. Gastrointestinal Not Present- Abdominal Pain, Bloating, Bloody Stool, Change in Bowel Habits, Chronic diarrhea, Constipation, Difficulty Swallowing, Excessive gas, Gets full quickly at meals, Hemorrhoids, Indigestion, Nausea, Rectal Pain and Vomiting. Female Genitourinary Not Present- Frequency, Nocturia, Painful Urination, Pelvic Pain and Urgency. Musculoskeletal Present- Muscle Pain and Muscle Weakness. Not Present- Back Pain, Joint Pain, Joint Stiffness and Swelling of Extremities. Neurological Not Present- Decreased Memory, Fainting, Headaches, Numbness, Seizures, Tingling, Tremor, Trouble walking and Weakness. Psychiatric Present- Depression. Not Present- Anxiety, Bipolar, Change in Sleep Pattern, Fearful and Frequent crying. Endocrine Not Present- Cold Intolerance, Excessive Hunger, Hair Changes, Heat Intolerance, Hot flashes and New Diabetes. Hematology Not Present- Blood Thinners, Easy Bruising, Excessive bleeding, Gland problems, HIV and Persistent Infections.  Vitals Malachy Moan RMA; 04/10/2017 9:17 AM) 04/10/2017  9:16 AM Weight: 169.6 lb Height: 63in Body Surface Area: 1.8 m Body Mass Index: 30.04 kg/m  Temp.: 98.44F  Pulse: 59 (Regular)  BP: 92/60 (Sitting, Left Arm, Standard)      Physical Exam [facility]  Rich Brave MD; 04/10/2017 9:30 AM)  General Note: alert, well appearing  Integumentary Note: in the right groin just lateral and superior to the pubic tubercle is a 3cm subcutaneous cyst. smooth, fluctuant and mobile. no overlying skin abnormalities. tender.  Head and Neck Note: no mass or thyromegaly  Eye Note: anicteric, extraocular motion intact  ENMT Note: normal dentition, moist mucus membranes  Chest and Lung Exam Note: unlabored respirations, symmetrical air entry  Cardiovascular Note: regular rate and rhythm, no pedal edema  Abdomen Note: soft, nontender, nondistended. well healed pfannenstiel scar. no mass or organomegaly, no hernia  Neurologic Note: grossly intact, normal gait  Neuropsychiatric Note: normal mood and affect. appropriate insight  Musculoskeletal Note: strength symmetrical throughout, no deformity  Lymphatic Note: there is no inguinal lymphadenopathy    Assessment & Plan (Kindred Heying A. Angy Swearengin MD; 04/10/2017 9:31 AM)  CYST OF SUBCUTANEOUS TISSUE (L72.9) Story: Symptomatic and she desires removal. Discussed risks of surgery including bleeding, pain, scarring, injury to adjacent structures, cyst recurrence, infection. She expressed understanding. Will schedule at earliest availability.

## 2017-04-11 DIAGNOSIS — M25572 Pain in left ankle and joints of left foot: Secondary | ICD-10-CM | POA: Diagnosis not present

## 2017-04-11 DIAGNOSIS — M25571 Pain in right ankle and joints of right foot: Secondary | ICD-10-CM | POA: Diagnosis not present

## 2017-04-11 DIAGNOSIS — M79672 Pain in left foot: Secondary | ICD-10-CM | POA: Diagnosis not present

## 2017-04-11 DIAGNOSIS — M79671 Pain in right foot: Secondary | ICD-10-CM | POA: Diagnosis not present

## 2017-04-11 LAB — CBC WITH DIFFERENTIAL/PLATELET
Basophils Absolute: 40 cells/uL (ref 0–200)
Basophils Relative: 1 %
Eosinophils Absolute: 80 cells/uL (ref 15–500)
Eosinophils Relative: 2 %
HCT: 42.1 % (ref 35.0–45.0)
Hemoglobin: 13.9 g/dL (ref 11.7–15.5)
Lymphocytes Relative: 42 %
Lymphs Abs: 1680 cells/uL (ref 850–3900)
MCH: 29.4 pg (ref 27.0–33.0)
MCHC: 33 g/dL (ref 32.0–36.0)
MCV: 89 fL (ref 80.0–100.0)
MPV: 13.5 fL — ABNORMAL HIGH (ref 7.5–12.5)
Monocytes Absolute: 240 cells/uL (ref 200–950)
Monocytes Relative: 6 %
Neutro Abs: 1960 cells/uL (ref 1500–7800)
Neutrophils Relative %: 49 %
Platelets: 219 10*3/uL (ref 140–400)
RBC: 4.73 MIL/uL (ref 3.80–5.10)
RDW: 13.6 % (ref 11.0–15.0)
WBC: 4 10*3/uL (ref 3.8–10.8)

## 2017-04-11 LAB — URIC ACID: Uric Acid, Serum: 3.5 mg/dL (ref 2.5–7.0)

## 2017-04-12 LAB — SEDIMENTATION RATE: Sed Rate: 4 mm/hr (ref 0–30)

## 2017-04-14 ENCOUNTER — Encounter: Payer: Self-pay | Admitting: Podiatry

## 2017-04-14 ENCOUNTER — Ambulatory Visit (INDEPENDENT_AMBULATORY_CARE_PROVIDER_SITE_OTHER): Payer: Medicare Other | Admitting: Podiatry

## 2017-04-14 VITALS — BP 110/69 | HR 59 | Temp 97.8°F | Resp 16

## 2017-04-14 DIAGNOSIS — D492 Neoplasm of unspecified behavior of bone, soft tissue, and skin: Secondary | ICD-10-CM

## 2017-04-14 DIAGNOSIS — D1801 Hemangioma of skin and subcutaneous tissue: Secondary | ICD-10-CM | POA: Diagnosis not present

## 2017-04-14 LAB — ANA: Anti Nuclear Antibody(ANA): NEGATIVE

## 2017-04-14 LAB — C-REACTIVE PROTEIN: CRP: 5.2 mg/L (ref <8.0)

## 2017-04-14 LAB — RHEUMATOID FACTOR: Rhuematoid fact SerPl-aCnc: 21 IU/mL — ABNORMAL HIGH (ref <14)

## 2017-04-14 MED ORDER — HYDROCODONE-ACETAMINOPHEN 7.5-325 MG PO TABS: 1.0000 | ORAL_TABLET | Freq: Four times a day (QID) | ORAL | 0 refills | Status: DC | PRN

## 2017-04-15 NOTE — Progress Notes (Signed)
   OPERATIVE REPORT Patient name: Betty Holmes MRN: [id number] DOB: 1956/03/14  DOS:  04/14/2017  Preop Dx: Soft tissue mass left foot Postop Dx: same  Procedure:  1. Excision of soft tissue mass left foot  Surgeon: Edrick Kins DPM  Anesthesia: 50-50 mixture of 2% lidocaine plain with 0.5% Marcaine plain totaling 3 mL infiltrated in the patient's left lower extremity  Hemostasis: Ankle tourniquet inflated to a pressure of 254mmHg after esmarch exsanguination   EBL: 2 mL Materials: None Injectables: None Pathology: Soft tissue mass sent to pathology for gross and microscopic exam  Condition: The patient tolerated the procedure and anesthesia well. No complications noted or reported  Justification for procedure: The patient is a 61 y.o. @GENDER @ who presents today for surgical correction of a symptomatic soft tissue mass to the dorsal aspect of the left foot. All conservative modalities of been unsuccessful in providing any sort of satisfactory alleviation of symptoms with the patient. The patient was told benefits as well as possible side effects of the surgery.All patient questions were answered. No guarantees were expressed or implied.   Procedure in Detail: The patient was brought to the procedure room, placed on the procedure table in the supine position at which time an aseptic scrub and drape were performed about the patient's respective lower extremity after anesthesia was induced as described above. Attention was then directed to the surgical area where procedure number one commenced.  Procedure #1: Excision of soft tissue mass left foot A 2 cm linear longitudinal skin incision was made overlying the soft tissue mass of the dorsal aspect of the left foot. The incision was carried down to the level subcutaneous tissue and soft tissue mass with care taken to cut clamped ligated or retracted way all small neurovascular structures traversing the incision site. Careful blunt  dissection was utilized to free the adhesions to the soft tissue mass at which time the soft tissue mass was resected and removed in toto and placing sterile specimen container and sent to pathology. Irrigation normal saline was utilized and dried in preparation for routine layered soft tissue closure. 4-0 Prolene suture was utilized to reapproximate superficial skin edges of the incision site.  Dry sterile compressive dressings were then applied to all previously mentioned incision sites about the patient's lower extremity. The tourniquet which was used for hemostasis was deflated. All normal neurovascular responses including pink color and warmth returned all the digits of patient's lower extremity.  Verbal as well as written instructions were provided for the patient regarding wound care. The patient is to keep the dressings clean dry and intact until they are to follow surgeon Dr. Daylene Katayama in the office upon discharge.   Edrick Kins, DPM [facility]  Dr. Edrick Kins, [address]                                        [place], [place] [address]                Office [phone]  Fax [phone]

## 2017-04-15 NOTE — Telephone Encounter (Signed)
Thank you :)

## 2017-04-21 ENCOUNTER — Encounter: Payer: Self-pay | Admitting: Nurse Practitioner

## 2017-04-21 DIAGNOSIS — E559 Vitamin D deficiency, unspecified: Secondary | ICD-10-CM | POA: Insufficient documentation

## 2017-04-21 DIAGNOSIS — M810 Age-related osteoporosis without current pathological fracture: Secondary | ICD-10-CM | POA: Insufficient documentation

## 2017-04-21 DIAGNOSIS — M4802 Spinal stenosis, cervical region: Secondary | ICD-10-CM | POA: Insufficient documentation

## 2017-04-21 DIAGNOSIS — M8580 Other specified disorders of bone density and structure, unspecified site: Secondary | ICD-10-CM | POA: Insufficient documentation

## 2017-04-23 ENCOUNTER — Encounter: Payer: Self-pay | Admitting: Podiatry

## 2017-04-23 ENCOUNTER — Ambulatory Visit (INDEPENDENT_AMBULATORY_CARE_PROVIDER_SITE_OTHER): Payer: Medicare Other | Admitting: Podiatry

## 2017-04-23 DIAGNOSIS — M7752 Other enthesopathy of left foot: Secondary | ICD-10-CM | POA: Diagnosis not present

## 2017-04-23 DIAGNOSIS — M25572 Pain in left ankle and joints of left foot: Secondary | ICD-10-CM | POA: Diagnosis not present

## 2017-04-23 DIAGNOSIS — M659 Synovitis and tenosynovitis, unspecified: Secondary | ICD-10-CM | POA: Diagnosis not present

## 2017-04-23 MED ORDER — DICLOFENAC SODIUM 3 % EX CREA: 1.0000 "application " | TOPICAL_CREAM | Freq: Two times a day (BID) | CUTANEOUS | 2 refills | Status: DC

## 2017-04-25 NOTE — Progress Notes (Signed)
   Subjective:  Patient presents today for follow-up evaluation status post soft tissue tumor excision of the left foot. DOS: 04/14/17. She reports she is doing better but complains of left ankle pain.  Objective / Physical Exam:  General:  The patient is alert and oriented x3 in no acute distress. Dermatology:  Skin is warm, dry and supple bilateral lower extremities. Negative for open lesions or macerations. Skin incision of the excision site left foot appears well coapted with sutures intact. No sign of infectious process noted. Vascular:  Palpable pedal pulses bilaterally. No edema or erythema noted. Capillary refill within normal limits. Neurological:  Epicritic and protective threshold grossly intact bilaterally.  Musculoskeletal Exam:  Pain on palpation to the anterior lateral medial aspects of the patient's left ankle. Mild edema noted.  Range of motion within normal limits to all pedal and ankle joints bilateral. Muscle strength 5/5 in all groups bilateral.    Assessment: 1. Status post soft tissue tumor excision left foot DOS: 04/14/17 2. Left ankle pain  Plan of Care:  #1 Patient was evaluated. #2 injection of 0.5 mL Celestone Soluspan injected in the patient's left ankle. #3 prescription for diclofenac 2% topical gel #4 continue wearing ankle brace  #5 patient is to return to clinic in 1 week for suture removal   Edrick Kins, DPM [facility]  Dr. Edrick Kins, [address]                                        [place], [place] [address]                Office [phone]  Fax [phone]

## 2017-04-28 MED ORDER — BETAMETHASONE SOD PHOS & ACET 6 (3-3) MG/ML IJ SUSP: 3.0000 mg | Freq: Once | INTRAMUSCULAR | Status: DC

## 2017-04-30 ENCOUNTER — Ambulatory Visit (INDEPENDENT_AMBULATORY_CARE_PROVIDER_SITE_OTHER): Payer: Self-pay | Admitting: Podiatry

## 2017-04-30 DIAGNOSIS — Z9889 Other specified postprocedural states: Secondary | ICD-10-CM

## 2017-05-01 ENCOUNTER — Ambulatory Visit (INDEPENDENT_AMBULATORY_CARE_PROVIDER_SITE_OTHER): Payer: Medicare Other | Admitting: Nurse Practitioner

## 2017-05-01 ENCOUNTER — Telehealth: Payer: Self-pay | Admitting: Podiatry

## 2017-05-01 ENCOUNTER — Encounter: Payer: Self-pay | Admitting: Nurse Practitioner

## 2017-05-01 ENCOUNTER — Other Ambulatory Visit (INDEPENDENT_AMBULATORY_CARE_PROVIDER_SITE_OTHER): Payer: Medicare Other

## 2017-05-01 VITALS — BP 120/72 | HR 58 | Temp 98.2°F | Ht 63.0 in | Wt 171.0 lb

## 2017-05-01 DIAGNOSIS — Z13220 Encounter for screening for lipoid disorders: Secondary | ICD-10-CM

## 2017-05-01 DIAGNOSIS — Z136 Encounter for screening for cardiovascular disorders: Secondary | ICD-10-CM

## 2017-05-01 DIAGNOSIS — F3341 Major depressive disorder, recurrent, in partial remission: Secondary | ICD-10-CM | POA: Diagnosis not present

## 2017-05-01 DIAGNOSIS — M797 Fibromyalgia: Secondary | ICD-10-CM

## 2017-05-01 DIAGNOSIS — M2550 Pain in unspecified joint: Secondary | ICD-10-CM | POA: Diagnosis not present

## 2017-05-01 DIAGNOSIS — E559 Vitamin D deficiency, unspecified: Secondary | ICD-10-CM | POA: Diagnosis not present

## 2017-05-01 DIAGNOSIS — Z1159 Encounter for screening for other viral diseases: Secondary | ICD-10-CM

## 2017-05-01 DIAGNOSIS — M791 Myalgia, unspecified site: Secondary | ICD-10-CM

## 2017-05-01 DIAGNOSIS — Z114 Encounter for screening for human immunodeficiency virus [HIV]: Secondary | ICD-10-CM | POA: Diagnosis not present

## 2017-05-01 LAB — LIPID PANEL
Cholesterol: 175 mg/dL (ref 0–200)
HDL: 68.1 mg/dL (ref >39.00)
LDL Cholesterol: 95 mg/dL (ref 0–99)
NonHDL: 106.77
Total CHOL/HDL Ratio: 3
Triglycerides: 57 mg/dL (ref 0.0–149.0)
VLDL: 11.4 mg/dL (ref 0.0–40.0)

## 2017-05-01 LAB — HEPATITIS C ANTIBODY: HCV Ab: NEGATIVE

## 2017-05-01 LAB — TSH: TSH: 1.11 u[IU]/mL (ref 0.35–4.50)

## 2017-05-01 MED ORDER — FLUOXETINE HCL 20 MG PO CAPS: 40.0000 mg | ORAL_CAPSULE | Freq: Every day | ORAL | 5 refills | Status: DC

## 2017-05-01 MED ORDER — PREGABALIN 25 MG PO CAPS: 25.0000 mg | ORAL_CAPSULE | Freq: Two times a day (BID) | ORAL | 5 refills | Status: DC

## 2017-05-01 MED ORDER — MELOXICAM 7.5 MG PO TABS: 7.5000 mg | ORAL_TABLET | Freq: Every day | ORAL | 5 refills | Status: DC

## 2017-05-01 MED ORDER — PREGABALIN 50 MG PO CAPS: 50.0000 mg | ORAL_CAPSULE | Freq: Two times a day (BID) | ORAL | 3 refills | Status: DC

## 2017-05-01 NOTE — Telephone Encounter (Signed)
Caryl Pina with [facility] [address] called wanting to know the status of the prior authorization on the Diclofenac Sodium 3% Dr. Amalia Hailey prescribed on 23 Apr 2017. Please call her back at [phone].

## 2017-05-01 NOTE — Progress Notes (Signed)
Subjective:  Patient ID: Betty Holmes, female    DOB: Jul 14, 1956  Age: 61 y.o. MRN: [id number]  CC: Follow-up (1 mo fu--right hand pain--come from neck? med consult? PT referral req? FYI=saw foot doctor--got the refer to rheumatolgy)   HPI  Depression: Stable mood with prozac. She will like medication changed to 20mg  capsules due to large size of 40mg  capsules. Denies any adverse effects.  Fibromyalgia: Reports some improvement with lyrica. Denies any increase somnolence or edema or fatigue.  Joint pain and stiffness: She was referred to rheumatogy by podiatry due to severe arthritis and positive RA factor. She has not yet been called with appt. She will like referral for PT to improve endurance and mobility while waiting for appt with rheumatology.  Outpatient Medications Prior to Visit  Medication Sig Dispense Refill  . FLUoxetine (PROZAC) 40 MG capsule Take 1 capsule (40 mg total) by mouth daily. 30 capsule 3  . ibuprofen (ADVIL,MOTRIN) 600 MG tablet Take 1 tablet (600 mg total) by mouth every 6 (six) hours as needed for moderate pain. 30 tablet 0  . pregabalin (LYRICA) 25 MG capsule Take 1 capsule (25 mg total) by mouth 2 (two) times daily. 60 capsule 0  . Diclofenac Sodium 3 % CREA Apply 1 application topically 2 (two) times daily. (Patient not taking: Reported on 05/01/2017) 120 g 2  . HYDROcodone-acetaminophen (NORCO) 7.5-325 MG tablet Take 1 tablet by mouth every 6 (six) hours as needed for moderate pain. (Patient not taking: Reported on 05/01/2017) 30 tablet 0  . NONFORMULARY OR COMPOUNDED ITEM [facility]  [place] Tendonitis Cream- Diclofenac 3%, Baclofen 2%, Bupivacaine 1%, Doxepin 5%, Gabapentin 6%, Ibuprofen 3%, Pentoxifylline 3% Apply 1-2 grams to affected area 3-4 times daily Qty. 120 gm 3 refills (Patient not taking: Reported on 05/01/2017) 1 each 0   Facility-Administered Medications Prior to Visit  Medication Dose Route Frequency Provider Last Rate  Last Dose  . betamethasone acetate-betamethasone sodium phosphate (CELESTONE) injection 3 mg  3 mg Intramuscular Once Daylene Katayama M, DPM        ROS See HPI  Objective:  BP 120/72   Pulse (!) 58   Temp 98.2 F (36.8 C)   Ht 5\' 3"  (1.6 m)   Wt 171 lb (77.6 kg)   SpO2 99%   BMI 30.29 kg/m   BP Readings from Last 3 Encounters:  05/01/17 120/72  04/14/17 110/69  04/01/17 110/70    Wt Readings from Last 3 Encounters:  05/01/17 171 lb (77.6 kg)  04/01/17 169 lb (76.7 kg)    Physical Exam  Constitutional: She is oriented to person, place, and time. No distress.  Neck: Normal range of motion. Neck supple. No thyromegaly present.  Cardiovascular: Normal rate, regular rhythm and normal heart sounds.   Pulmonary/Chest: Effort normal and breath sounds normal.  Abdominal: Soft. Bowel sounds are normal.  Musculoskeletal: She exhibits tenderness and deformity. She exhibits no edema.  Deformity of DIP and PIP of bilateral hand. No erythema or effusion of joints  Lymphadenopathy:    She has no cervical adenopathy.  Neurological: She is alert and oriented to person, place, and time.  Vitals reviewed.   Lab Results  Component Value Date   WBC 4.0 04/11/2017   HGB 13.9 04/11/2017   HCT 42.1 04/11/2017   PLT 219 04/11/2017   GLUCOSE 95 03/24/2017   ALT 15 03/24/2017   AST 24 03/24/2017   NA 138 03/24/2017   K 4.2 03/24/2017   CL 104 03/24/2017  CREATININE 0.62 03/24/2017   BUN 12 03/24/2017   CO2 28 03/24/2017    Ct Abdomen Pelvis W Contrast  Result Date: 03/24/2017 CLINICAL DATA:  Painful right inguinal lump, which has been present and intermittently painful for 2 years, with new pain last night. EXAM: CT ABDOMEN AND PELVIS WITH CONTRAST TECHNIQUE: Multidetector CT imaging of the abdomen and pelvis was performed using the standard protocol following bolus administration of intravenous contrast. CONTRAST:  196mL ISOVUE-300 IOPAMIDOL (ISOVUE-300) INJECTION 61% COMPARISON:   None. FINDINGS: Lower chest: No significant pulmonary nodules or acute consolidative airspace disease. Hepatobiliary: Normal liver size. Two scattered subcentimeter hypodense liver lesions are too small to characterize and require no further follow up unless the patient has risk factors for liver malignancy. No additional liver lesions. Normal gallbladder with no radiopaque cholelithiasis. No biliary ductal dilatation. Pancreas: Normal, with no mass or duct dilation. Spleen: Normal size. No mass. Adrenals/Urinary Tract: Normal adrenals. Normal kidneys with no hydronephrosis and no renal mass. Normal bladder. Stomach/Bowel: Small hiatal hernia. Otherwise collapsed and grossly normal stomach. Normal caliber small bowel with no small bowel wall thickening. Normal appendix. Mild sigmoid diverticulosis, with no large bowel wall thickening or pericolonic fat stranding. Vascular/Lymphatic: Normal caliber abdominal aorta. Patent portal, splenic, hepatic and renal veins. No pathologically enlarged lymph nodes in the abdomen or pelvis. Reproductive: Grossly normal uterus.  No adnexal mass. Other: No pneumoperitoneum. No ascites. No intraperitoneal fluid collections. There is a 2.7 x 2.1 x 2.4 cm cystic structure in the subcutaneous right inguinal region (series 3/image 71), which demonstrates minimal wall thickening and slight haziness of the surrounding fat, with no appreciable solid component or internal gas. No appreciable inguinal or femoral hernia. Musculoskeletal: No aggressive appearing focal osseous lesions. Moderate thoracolumbar spondylosis. IMPRESSION: 1. Right inguinal subcutaneous 2.7 x 2.1 x 2.4 cm cystic structure with minimal wall thickening and slight haziness of the surrounding fat, which may indicate an inflamed or infected cyst. No evidence of an inguinal or femoral hernia. 2. Otherwise no acute abnormality. No evidence of bowel obstruction or acute bowel inflammation. Normal appendix. 3. Small hiatal  hernia. Electronically Signed   By: Ilona Sorrel M.D.   On: 03/24/2017 13:53    Assessment & Plan:   Betty Holmes was seen today for follow-up.  Diagnoses and all orders for this visit:  Recurrent major depressive disorder, in partial remission ([place]) -     TSH; Future -     FLUoxetine (PROZAC) 20 MG capsule; Take 2 capsules (40 mg total) by mouth daily.  Encounter for lipid screening for cardiovascular disease -     Lipid panel; Future  Fibromyalgia -     TSH; Future -     Discontinue: pregabalin (LYRICA) 25 MG capsule; Take 1 capsule (25 mg total) by mouth 2 (two) times daily. -     meloxicam (MOBIC) 7.5 MG tablet; Take 1 tablet (7.5 mg total) by mouth daily. With food -     Ambulatory referral to Physical Therapy -     pregabalin (LYRICA) 50 MG capsule; Take 1 capsule (50 mg total) by mouth 2 (two) times daily.  Vitamin D deficiency -     Vitamin D 1,25 dihydroxy; Future  Encounter for hepatitis C screening test for low risk patient -     Hepatitis C Antibody; Future  Encounter for screening for human immunodeficiency virus (HIV) -     HIV antibody; Future  Arthralgia, unspecified joint -     Ambulatory referral to Physical Therapy  Myalgia -     Ambulatory referral to Physical Therapy   I have discontinued Ms. Stroud's ibuprofen, FLUoxetine, pregabalin, NONFORMULARY OR COMPOUNDED ITEM, HYDROcodone-acetaminophen, Diclofenac Sodium, and pregabalin. I am also having her start on FLUoxetine, meloxicam, and pregabalin. We will continue to administer betamethasone acetate-betamethasone sodium phosphate.  Meds ordered this encounter  Medications  . FLUoxetine (PROZAC) 20 MG capsule    Sig: Take 2 capsules (40 mg total) by mouth daily.    Dispense:  60 capsule    Refill:  5    Order Specific Question:   Supervising Provider    Answer:   Cassandria Anger [[id number]]  . DISCONTD: pregabalin (LYRICA) 25 MG capsule    Sig: Take 1 capsule (25 mg total) by mouth 2 (two) times  daily.    Dispense:  60 capsule    Refill:  5    Order Specific Question:   Supervising Provider    Answer:   Cassandria Anger [[id number]]  . meloxicam (MOBIC) 7.5 MG tablet    Sig: Take 1 tablet (7.5 mg total) by mouth daily. With food    Dispense:  30 tablet    Refill:  5    Order Specific Question:   Supervising Provider    Answer:   Cassandria Anger [[id number]]  . pregabalin (LYRICA) 50 MG capsule    Sig: Take 1 capsule (50 mg total) by mouth 2 (two) times daily.    Dispense:  60 capsule    Refill:  3    Order Specific Question:   Supervising Provider    Answer:   Cassandria Anger [[id number]]    Follow-up: Return in about 3 months (around 08/01/2017) for depression, generalized pain.  Wilfred Lacy, NP

## 2017-05-01 NOTE — Patient Instructions (Addendum)
If you have medicare related insurance (such as traditional Medicare, Blue Cross Medicare, United HealthCare Medicare, or similar), Please make an appointment at the scheduling desk with Jill, the Wellness Health Coach, for your Wellness visit in this office, which is a benefit with your insurance.  

## 2017-05-01 NOTE — Progress Notes (Signed)
Pre visit review using our clinic review tool, if applicable. No additional management support is needed unless otherwise documented below in the visit note. 

## 2017-05-02 ENCOUNTER — Encounter (HOSPITAL_BASED_OUTPATIENT_CLINIC_OR_DEPARTMENT_OTHER): Payer: Self-pay | Admitting: *Deleted

## 2017-05-02 ENCOUNTER — Ambulatory Visit: Payer: Medicare Other

## 2017-05-02 LAB — HIV ANTIBODY (ROUTINE TESTING W REFLEX): HIV 1&2 Ab, 4th Generation: NONREACTIVE

## 2017-05-02 MED ORDER — DICLOFENAC SODIUM 1 % TD GEL: 2.0000 g | Freq: Four times a day (QID) | TRANSDERMAL | 6 refills | Status: DC

## 2017-05-02 NOTE — Telephone Encounter (Signed)
Left message informing that her Diclofenac Gel was pending approval from her insurance. I offered the [place] antiinflammatory Cream as an alternative and request a call back if that was a change she would like to make.

## 2017-05-02 NOTE — Progress Notes (Signed)
   Subjective:  Patient presents today for follow-up evaluation status post soft tissue tumor excision of the left foot. DOS: 04/14/17. She reports she is doing better.  Objective / Physical Exam:  General:  The patient is alert and oriented x3 in no acute distress. Dermatology:  Skin is warm, dry and supple bilateral lower extremities. Negative for open lesions or macerations. Skin incision of the excision site left foot appears well coapted with sutures intact. No sign of infectious process noted. Vascular:  Palpable pedal pulses bilaterally. No edema or erythema noted. Capillary refill within normal limits. Neurological:  Epicritic and protective threshold grossly intact bilaterally.  Musculoskeletal Exam:  No significant pain on palpation to the anterior lateral medial aspects of the patient's left ankle. Mild edema noted.  Range of motion within normal limits to all pedal and ankle joints bilateral. Muscle strength 5/5 in all groups bilateral.    Assessment: 1. Status post soft tissue tumor excision left foot DOS: 04/14/17   Plan of Care:  #1 Patient was evaluated. #2 Return to clinic if ankle pain returns.   Edrick Kins, DPM [facility]  Dr. Edrick Kins, [address]                                        [place], [place] [address]                Office [phone]  Fax [phone]

## 2017-05-04 LAB — VITAMIN D 1,25 DIHYDROXY
Vitamin D 1, 25 (OH)2 Total: 30 pg/mL (ref 18–72)
Vitamin D2 1, 25 (OH)2: <8 pg/mL
Vitamin D3 1, 25 (OH)2: 30 pg/mL

## 2017-05-05 NOTE — Progress Notes (Signed)
hibiclens soap given with instructions, pt verbalized understanding.

## 2017-05-09 ENCOUNTER — Encounter (HOSPITAL_BASED_OUTPATIENT_CLINIC_OR_DEPARTMENT_OTHER): Payer: Self-pay

## 2017-05-09 ENCOUNTER — Ambulatory Visit (HOSPITAL_BASED_OUTPATIENT_CLINIC_OR_DEPARTMENT_OTHER): Payer: Medicare Other | Admitting: Certified Registered"

## 2017-05-09 ENCOUNTER — Encounter (HOSPITAL_BASED_OUTPATIENT_CLINIC_OR_DEPARTMENT_OTHER)
Admission: RE | Disposition: A | Discharge status: Home / Self Care | Payer: Self-pay | Source: Ambulatory Visit | Attending: Surgery

## 2017-05-09 ENCOUNTER — Ambulatory Visit (HOSPITAL_BASED_OUTPATIENT_CLINIC_OR_DEPARTMENT_OTHER)
Admission: RE | Admit: 2017-05-09 | Discharge: 2017-05-09 | Disposition: A | Discharge status: Home / Self Care | Payer: Medicare Other | Source: Ambulatory Visit | Attending: Surgery | Admitting: Surgery

## 2017-05-09 DIAGNOSIS — F419 Anxiety disorder, unspecified: Secondary | ICD-10-CM | POA: Diagnosis not present

## 2017-05-09 DIAGNOSIS — M797 Fibromyalgia: Secondary | ICD-10-CM | POA: Diagnosis not present

## 2017-05-09 DIAGNOSIS — F329 Major depressive disorder, single episode, unspecified: Secondary | ICD-10-CM | POA: Diagnosis not present

## 2017-05-09 DIAGNOSIS — K4190 Unilateral femoral hernia, without obstruction or gangrene, not specified as recurrent: Secondary | ICD-10-CM | POA: Insufficient documentation

## 2017-05-09 DIAGNOSIS — Z79899 Other long term (current) drug therapy: Secondary | ICD-10-CM | POA: Diagnosis not present

## 2017-05-09 DIAGNOSIS — L720 Epidermal cyst: Secondary | ICD-10-CM | POA: Diagnosis present

## 2017-05-09 HISTORY — DX: Anxiety disorder, unspecified: F41.9

## 2017-05-09 HISTORY — DX: Depression, unspecified: F32.A

## 2017-05-09 HISTORY — DX: Major depressive disorder, single episode, unspecified: F32.9

## 2017-05-09 HISTORY — PX: CYST EXCISION: SHX5701

## 2017-05-09 SURGERY — CYST REMOVAL
Anesthesia: General | Site: Groin | Laterality: Right

## 2017-05-09 MED ORDER — ACETAMINOPHEN 650 MG RE SUPP: 650.0000 mg | RECTAL | Status: DC | PRN

## 2017-05-09 MED ORDER — MIDAZOLAM HCL 2 MG/2ML IJ SOLN
1.0000 mg | INTRAMUSCULAR | Status: DC | PRN
  Administered 2017-05-09: 2 mg via INTRAVENOUS

## 2017-05-09 MED ORDER — PROPOFOL 10 MG/ML IV BOLUS
INTRAVENOUS | Status: DC | PRN
  Administered 2017-05-09: 200 mg via INTRAVENOUS

## 2017-05-09 MED ORDER — CHLORHEXIDINE GLUCONATE 4 % EX LIQD: 60.0000 mL | Freq: Once | CUTANEOUS | Status: DC

## 2017-05-09 MED ORDER — GLYCOPYRROLATE 0.2 MG/ML IJ SOLN
INTRAMUSCULAR | Status: DC | PRN
  Administered 2017-05-09: 0.1 mg via INTRAVENOUS

## 2017-05-09 MED ORDER — OXYCODONE HCL 5 MG/5ML PO SOLN: 5.0000 mg | Freq: Once | ORAL | Status: DC | PRN

## 2017-05-09 MED ORDER — FENTANYL CITRATE (PF) 100 MCG/2ML IJ SOLN
50.0000 ug | INTRAMUSCULAR | Status: AC | PRN
  Administered 2017-05-09 (×3): 50 ug via INTRAVENOUS

## 2017-05-09 MED ORDER — BUPIVACAINE HCL (PF) 0.25 % IJ SOLN
INTRAMUSCULAR | Status: AC
  Filled 2017-05-09: qty 30

## 2017-05-09 MED ORDER — FENTANYL CITRATE (PF) 100 MCG/2ML IJ SOLN
INTRAMUSCULAR | Status: AC
  Filled 2017-05-09: qty 2

## 2017-05-09 MED ORDER — OXYCODONE HCL 5 MG PO TABS: 5.0000 mg | ORAL_TABLET | ORAL | Status: DC | PRN

## 2017-05-09 MED ORDER — BUPIVACAINE HCL (PF) 0.5 % IJ SOLN
INTRAMUSCULAR | Status: DC | PRN
  Administered 2017-05-09: 10 mL

## 2017-05-09 MED ORDER — VANCOMYCIN HCL IN DEXTROSE 1-5 GM/200ML-% IV SOLN
INTRAVENOUS | Status: AC
  Filled 2017-05-09: qty 200

## 2017-05-09 MED ORDER — LIDOCAINE 2% (20 MG/ML) 5 ML SYRINGE
INTRAMUSCULAR | Status: AC
Start: 2017-05-09 — End: 2017-05-09
  Filled 2017-05-09: qty 5

## 2017-05-09 MED ORDER — BUPIVACAINE HCL (PF) 0.5 % IJ SOLN
INTRAMUSCULAR | Status: AC
  Filled 2017-05-09: qty 30

## 2017-05-09 MED ORDER — PROPOFOL 10 MG/ML IV BOLUS
INTRAVENOUS | Status: AC
  Filled 2017-05-09: qty 20

## 2017-05-09 MED ORDER — OXYCODONE HCL 5 MG PO TABS: 5.0000 mg | ORAL_TABLET | Freq: Once | ORAL | Status: DC | PRN

## 2017-05-09 MED ORDER — PROMETHAZINE HCL 25 MG/ML IJ SOLN
6.2500 mg | INTRAMUSCULAR | Status: DC | PRN
Start: 2017-05-09 — End: 2017-05-09

## 2017-05-09 MED ORDER — DEXAMETHASONE SODIUM PHOSPHATE 4 MG/ML IJ SOLN
INTRAMUSCULAR | Status: DC | PRN
  Administered 2017-05-09: 10 mg via INTRAVENOUS

## 2017-05-09 MED ORDER — EPHEDRINE SULFATE 50 MG/ML IJ SOLN
INTRAMUSCULAR | Status: DC | PRN
Start: 2017-05-09 — End: 2017-05-09
  Administered 2017-05-09: 10 mg via INTRAVENOUS

## 2017-05-09 MED ORDER — ACETAMINOPHEN 325 MG PO TABS: 650.0000 mg | ORAL_TABLET | ORAL | Status: DC | PRN

## 2017-05-09 MED ORDER — MIDAZOLAM HCL 2 MG/2ML IJ SOLN
INTRAMUSCULAR | Status: AC
  Filled 2017-05-09: qty 2

## 2017-05-09 MED ORDER — LACTATED RINGERS IV SOLN: INTRAVENOUS | Status: DC

## 2017-05-09 MED ORDER — ONDANSETRON HCL 4 MG/2ML IJ SOLN
INTRAMUSCULAR | Status: DC | PRN
  Administered 2017-05-09: 4 mg via INTRAVENOUS

## 2017-05-09 MED ORDER — VANCOMYCIN HCL IN DEXTROSE 1-5 GM/200ML-% IV SOLN
1000.0000 mg | INTRAVENOUS | Status: AC
  Administered 2017-05-09: 1000 mg via INTRAVENOUS

## 2017-05-09 MED ORDER — SODIUM CHLORIDE 0.9 % IV SOLN: 250.0000 mL | INTRAVENOUS | Status: DC | PRN

## 2017-05-09 MED ORDER — ONDANSETRON HCL 4 MG/2ML IJ SOLN
INTRAMUSCULAR | Status: AC
  Filled 2017-05-09: qty 2

## 2017-05-09 MED ORDER — LIDOCAINE-EPINEPHRINE (PF) 1 %-1:200000 IJ SOLN
INTRAMUSCULAR | Status: AC
  Filled 2017-05-09: qty 30

## 2017-05-09 MED ORDER — SCOPOLAMINE 1 MG/3DAYS TD PT72: 1.0000 | MEDICATED_PATCH | Freq: Once | TRANSDERMAL | Status: DC | PRN

## 2017-05-09 MED ORDER — LACTATED RINGERS IV SOLN
INTRAVENOUS | Status: DC
  Administered 2017-05-09 (×2): via INTRAVENOUS

## 2017-05-09 MED ORDER — DEXAMETHASONE SODIUM PHOSPHATE 10 MG/ML IJ SOLN
INTRAMUSCULAR | Status: AC
Start: 2017-05-09 — End: 2017-05-09
  Filled 2017-05-09: qty 1

## 2017-05-09 MED ORDER — LIDOCAINE HCL (CARDIAC) 20 MG/ML IV SOLN
INTRAVENOUS | Status: DC | PRN
  Administered 2017-05-09: 60 mg via INTRAVENOUS

## 2017-05-09 MED ORDER — SODIUM CHLORIDE 0.9% FLUSH: 3.0000 mL | INTRAVENOUS | Status: DC | PRN

## 2017-05-09 MED ORDER — MEPERIDINE HCL 25 MG/ML IJ SOLN: 6.2500 mg | INTRAMUSCULAR | Status: DC | PRN

## 2017-05-09 MED ORDER — TRAMADOL HCL 50 MG PO TABS: 50.0000 mg | ORAL_TABLET | Freq: Four times a day (QID) | ORAL | 0 refills | Status: DC | PRN

## 2017-05-09 MED ORDER — SODIUM CHLORIDE 0.9% FLUSH: 3.0000 mL | Freq: Two times a day (BID) | INTRAVENOUS | Status: DC

## 2017-05-09 MED ORDER — FENTANYL CITRATE (PF) 100 MCG/2ML IJ SOLN: 25.0000 ug | INTRAMUSCULAR | Status: DC | PRN

## 2017-05-09 MED ORDER — FENTANYL CITRATE (PF) 100 MCG/2ML IJ SOLN
25.0000 ug | INTRAMUSCULAR | Status: DC | PRN
Start: 2017-05-09 — End: 2017-05-09

## 2017-05-09 MED ORDER — DOCUSATE SODIUM 100 MG PO CAPS
100.0000 mg | ORAL_CAPSULE | Freq: Two times a day (BID) | ORAL | 0 refills | Status: AC
Start: 2017-05-09 — End: 2017-06-08

## 2017-05-09 MED ORDER — FENTANYL CITRATE (PF) 100 MCG/2ML IJ SOLN
25.0000 ug | INTRAMUSCULAR | Status: DC | PRN
  Administered 2017-05-09: 50 ug via INTRAVENOUS

## 2017-05-09 SURGICAL SUPPLY — 52 items
ADH SKN CLS APL DERMABOND .7 (GAUZE/BANDAGES/DRESSINGS) ×1
BLADE CLIPPER SURG (BLADE) ×1 IMPLANT
BLADE SURG 15 STRL LF DISP TIS (BLADE) ×1 IMPLANT
BLADE SURG 15 STRL SS (BLADE) ×2
CANISTER SUCT 1200ML W/VALVE (MISCELLANEOUS) IMPLANT
CHLORAPREP W/TINT 26ML (MISCELLANEOUS) ×2 IMPLANT
CLSR STERI-STRIP ANTIMIC 1/2X4 (GAUZE/BANDAGES/DRESSINGS) ×1 IMPLANT
COVER BACK TABLE 60X90IN (DRAPES) ×2 IMPLANT
COVER MAYO STAND STRL (DRAPES) ×2 IMPLANT
DECANTER SPIKE VIAL GLASS SM (MISCELLANEOUS) IMPLANT
DERMABOND ADVANCED (GAUZE/BANDAGES/DRESSINGS) ×1
DERMABOND ADVANCED .7 DNX12 (GAUZE/BANDAGES/DRESSINGS) ×1 IMPLANT
DRAPE LAPAROTOMY 100X72 PEDS (DRAPES) ×2 IMPLANT
DRAPE UTILITY XL STRL (DRAPES) ×2 IMPLANT
DRSG TEGADERM 4X4.75 (GAUZE/BANDAGES/DRESSINGS) IMPLANT
ELECT COATED BLADE 2.86 ST (ELECTRODE) ×1 IMPLANT
ELECT REM PT RETURN 9FT ADLT (ELECTROSURGICAL) ×2
ELECTRODE REM PT RTRN 9FT ADLT (ELECTROSURGICAL) ×1 IMPLANT
GAUZE PACKING IODOFORM 1/4X15 (GAUZE/BANDAGES/DRESSINGS) IMPLANT
GAUZE SPONGE 4X4 12PLY STRL LF (GAUZE/BANDAGES/DRESSINGS) IMPLANT
GLOVE BIO SURGEON STRL SZ 6 (GLOVE) ×2 IMPLANT
GLOVE BIO SURGEON STRL SZ7 (GLOVE) ×1 IMPLANT
GLOVE BIOGEL PI IND STRL 6.5 (GLOVE) ×1 IMPLANT
GLOVE BIOGEL PI IND STRL 7.0 (GLOVE) IMPLANT
GLOVE BIOGEL PI INDICATOR 6.5 (GLOVE) ×1
GLOVE BIOGEL PI INDICATOR 7.0 (GLOVE) ×3
GLOVE SURG SS PI 6.5 STRL IVOR (GLOVE) ×1 IMPLANT
GOWN STRL REUS W/ TWL LRG LVL3 (GOWN DISPOSABLE) ×2 IMPLANT
GOWN STRL REUS W/TWL LRG LVL3 (GOWN DISPOSABLE) ×4
NDL HYPO 25X1 1.5 SAFETY (NEEDLE) ×1 IMPLANT
NEEDLE HYPO 25X1 1.5 SAFETY (NEEDLE) ×2 IMPLANT
NS IRRIG 1000ML POUR BTL (IV SOLUTION) IMPLANT
PACK BASIN DAY SURGERY FS (CUSTOM PROCEDURE TRAY) ×2 IMPLANT
PENCIL BUTTON HOLSTER BLD 10FT (ELECTRODE) ×2 IMPLANT
SLEEVE SCD COMPRESS KNEE MED (MISCELLANEOUS) ×2 IMPLANT
SUT ETHILON 2 0 FS 18 (SUTURE) IMPLANT
SUT MNCRL AB 4-0 PS2 18 (SUTURE) ×2 IMPLANT
SUT SILK 2 0 SH (SUTURE) IMPLANT
SUT VIC AB 2-0 SH 27 (SUTURE) ×2
SUT VIC AB 2-0 SH 27XBRD (SUTURE) IMPLANT
SUT VIC AB 3-0 SH 27 (SUTURE) ×2
SUT VIC AB 3-0 SH 27X BRD (SUTURE) IMPLANT
SUT VICRYL 3-0 CR8 SH (SUTURE) IMPLANT
SUT VICRYL 4-0 PS2 18IN ABS (SUTURE) IMPLANT
SWAB COLLECTION DEVICE MRSA (MISCELLANEOUS) IMPLANT
SWAB CULTURE ESWAB REG 1ML (MISCELLANEOUS) IMPLANT
SYR CONTROL 10ML LL (SYRINGE) ×2 IMPLANT
TOWEL OR 17X24 6PK STRL BLUE (TOWEL DISPOSABLE) ×2 IMPLANT
TOWEL OR NON WOVEN STRL DISP B (DISPOSABLE) ×2 IMPLANT
TUBE CONNECTING 20X1/4 (TUBING) IMPLANT
UNDERPAD 30X30 (UNDERPADS AND DIAPERS) IMPLANT
YANKAUER SUCT BULB TIP NO VENT (SUCTIONS) IMPLANT

## 2017-05-09 NOTE — Transfer of Care (Signed)
Immediate Anesthesia Transfer of Care Note  Patient: Betty Holmes [facility]  Procedure(s) Performed: Procedure(s): EXCISION RIGHT INGUINAL CYST (Right)  Patient Location: PACU  Anesthesia Type:General  Level of Consciousness: awake and patient cooperative  Airway & Oxygen Therapy: Patient Spontanous Breathing and Patient connected to face mask oxygen  Post-op Assessment: Report given to RN and Post -op Vital signs reviewed and stable  Post vital signs: Reviewed and stable  Last Vitals:  Vitals:   05/09/17 1049  BP: 110/67  Pulse: 60  Resp: 18  Temp: 36.8 C    Last Pain:  Vitals:   05/09/17 1049  TempSrc: Oral  PainSc:       Patients Stated Pain Goal: 1 (11/57/26 2035)  Complications: No apparent anesthesia complications

## 2017-05-09 NOTE — Anesthesia Preprocedure Evaluation (Addendum)
Anesthesia Evaluation  Patient identified by MRN, date of birth, ID band Patient awake    Reviewed: Allergy & Precautions, NPO status , Patient's Chart, lab work & pertinent test results  Airway Mallampati: I       Dental no notable dental hx.    Pulmonary neg pulmonary ROS,    Pulmonary exam normal        Cardiovascular negative cardio ROS   Rhythm:Regular Rate:Normal     Neuro/Psych PSYCHIATRIC DISORDERS Anxiety Depression negative neurological ROS     GI/Hepatic negative GI ROS, Neg liver ROS,   Endo/Other  negative endocrine ROS  Renal/GU negative Renal ROS  negative genitourinary   Musculoskeletal  (+) Fibromyalgia -  Abdominal   Peds negative pediatric ROS (+)  Hematology negative hematology ROS (+)   Anesthesia Other Findings Day of surgery medications reviewed with the patient.  Reproductive/Obstetrics                            Anesthesia Physical Anesthesia Plan  ASA: II  Anesthesia Plan: General   Post-op Pain Management:    Induction: Intravenous  Airway Management Planned: LMA  Additional Equipment:   Intra-op Plan:   Post-operative Plan: Extubation in OR  Informed Consent: I have reviewed the patients History and Physical, chart, labs and discussed the procedure including the risks, benefits and alternatives for the proposed anesthesia with the patient or authorized representative who has indicated his/her understanding and acceptance.   Dental advisory given  Plan Discussed with: CRNA  Anesthesia Plan Comments:         Anesthesia Quick Evaluation

## 2017-05-09 NOTE — H&P (View-Only) (Signed)
Marylan Glore [facility] 04/10/2017 9:15 AM Location: [facility] Patient #: [id number] DOB: 01/20/1956 Separated / Language: [name] / Race: White Female  History of Present Illness (Mikhia Dusek A. Kae Heller MD; 04/10/2017 9:27 AM) Patient words: Right inguinal cyst which has been present for 2 years. Has never drained or had any procedures, but will increase in size from time to time and then go back down. It is painful to her. THe pain is sharp and radiates into the right abdomen and labia. irritated by clothing and activity. Had a CT in the ER 3 weeks ago which was completely negative aside from "There is a 2.7 x 2.1 x 2.4 cm cystic structure in the subcutaneous right inguinal region (series 3/image 71), which demonstrates minimal wall thickening and slight haziness of the surrounding fat, with no appreciable solid component or internal gas.".  The patient is a 61 year old female.   Past Surgical History Malachy Moan, [place]; 04/10/2017 9:15 AM) Cesarean Section - 1  Diagnostic Studies History Malachy Moan, RMA; 04/10/2017 9:15 AM) Colonoscopy 1-5 years ago Mammogram 1-3 years ago Pap Smear 1-5 years ago  Allergies Malachy Moan, RMA; 04/10/2017 9:16 AM) Penicillins Hives.  Medication History Malachy Moan, [place]; 04/10/2017 9:16 AM) Lyrica (25MG  Capsule, Oral) Active. FLUoxetine HCl (40MG  Capsule, Oral) Active. Ibuprofen (600MG  Tablet, Oral) Active. Medications Reconciled  Social History Malachy Moan, [place]; 04/10/2017 9:15 AM) Caffeine use Coffee. No alcohol use No drug use Tobacco use Never smoker.  Family History Malachy Moan, [place]; 04/10/2017 9:15 AM) Arthritis Mother. Heart Disease Father.  Pregnancy / Birth History Malachy Moan, [place]; 04/10/2017 9:15 AM) Age at menarche 54 years. Age of menopause 87-60 Gravida 4 Length (months) of breastfeeding >30 Maternal age 72-25 Para 4  Other Problems Malachy Moan, [place];  04/10/2017 9:15 AM) Arthritis Depression     Review of Systems Malachy Moan RMA; 04/10/2017 9:15 AM) General Not Present- Appetite Loss, Chills, Fatigue, Fever, Night Sweats, Weight Gain and Weight Loss. Skin Not Present- Change in Wart/Mole, Dryness, Hives, Jaundice, New Lesions, Non-Healing Wounds, Rash and Ulcer. HEENT Not Present- Earache, Hearing Loss, Hoarseness, Nose Bleed, Oral Ulcers, Ringing in the Ears, Seasonal Allergies, Sinus Pain, Sore Throat, Visual Disturbances, Wears glasses/contact lenses and Yellow Eyes. Respiratory Not Present- Bloody sputum, Chronic Cough, Difficulty Breathing, Snoring and Wheezing. Breast Not Present- Breast Mass, Breast Pain, Nipple Discharge and Skin Changes. Cardiovascular Not Present- Chest Pain, Difficulty Breathing Lying Down, Leg Cramps, Palpitations, Rapid Heart Rate, Shortness of Breath and Swelling of Extremities. Gastrointestinal Not Present- Abdominal Pain, Bloating, Bloody Stool, Change in Bowel Habits, Chronic diarrhea, Constipation, Difficulty Swallowing, Excessive gas, Gets full quickly at meals, Hemorrhoids, Indigestion, Nausea, Rectal Pain and Vomiting. Female Genitourinary Not Present- Frequency, Nocturia, Painful Urination, Pelvic Pain and Urgency. Musculoskeletal Present- Muscle Pain and Muscle Weakness. Not Present- Back Pain, Joint Pain, Joint Stiffness and Swelling of Extremities. Neurological Not Present- Decreased Memory, Fainting, Headaches, Numbness, Seizures, Tingling, Tremor, Trouble walking and Weakness. Psychiatric Present- Depression. Not Present- Anxiety, Bipolar, Change in Sleep Pattern, Fearful and Frequent crying. Endocrine Not Present- Cold Intolerance, Excessive Hunger, Hair Changes, Heat Intolerance, Hot flashes and New Diabetes. Hematology Not Present- Blood Thinners, Easy Bruising, Excessive bleeding, Gland problems, HIV and Persistent Infections.  Vitals Malachy Moan RMA; 04/10/2017 9:17 AM) 04/10/2017  9:16 AM Weight: 169.6 lb Height: 63in Body Surface Area: 1.8 m Body Mass Index: 30.04 kg/m  Temp.: 98.53F  Pulse: 59 (Regular)  BP: 92/60 (Sitting, Left Arm, Standard)      Physical Exam [facility]  Rich Brave MD; 04/10/2017 9:30 AM)  General Note: alert, well appearing  Integumentary Note: in the right groin just lateral and superior to the pubic tubercle is a 3cm subcutaneous cyst. smooth, fluctuant and mobile. no overlying skin abnormalities. tender.  Head and Neck Note: no mass or thyromegaly  Eye Note: anicteric, extraocular motion intact  ENMT Note: normal dentition, moist mucus membranes  Chest and Lung Exam Note: unlabored respirations, symmetrical air entry  Cardiovascular Note: regular rate and rhythm, no pedal edema  Abdomen Note: soft, nontender, nondistended. well healed pfannenstiel scar. no mass or organomegaly, no hernia  Neurologic Note: grossly intact, normal gait  Neuropsychiatric Note: normal mood and affect. appropriate insight  Musculoskeletal Note: strength symmetrical throughout, no deformity  Lymphatic Note: there is no inguinal lymphadenopathy    Assessment & Plan (Curry Dulski A. Yaretzi Ernandez MD; 04/10/2017 9:31 AM)  CYST OF SUBCUTANEOUS TISSUE (L72.9) Story: Symptomatic and she desires removal. Discussed risks of surgery including bleeding, pain, scarring, injury to adjacent structures, cyst recurrence, infection. She expressed understanding. Will schedule at earliest availability.

## 2017-05-09 NOTE — Op Note (Signed)
Operative Note  Betty Holmes  [id number]  [id number]  05/09/2017   Surgeon: Clovis Riley  Assistant: OR staff  Procedure performed: Excision of right femoral cyst  Preop diagnosis: right groin cyst Post-op diagnosis/intraop findings: cyst in right femoral space, possible tiny femoral hernia  Specimens: right femoral cyst Retained items: none EBL: minimal cc Complications: none  Description of procedure: After obtaining informed consent the patient was taken to the operating room and placed supine on operating room table wheregeneral anesthesia with LMA was initiated, preoperative antibiotics were administered, SCDs applied, and a formal timeout was performed. The right groin was clipped prepped and draped in usual sterile fashion. The cyst was again palpated, it was very mobile and felt subcutaneous, approximately 2 or 3 cm in diameter. Following infiltration with local, I made an incision in the right groin and dissected the soft tissues using cautery, exposing the external oblique aponeurosis and then using blunt digital dissection mobilized the cystic structure into the field. The cyst was dissected circumferentially and found to narrow down to a neck which was protruding from inferior to the inguinal ligament and just lateral to the pubic tubercle, concerning for a femoral hernia. The space here was too small to permit even my pinky finger. Once I had this cyst/hernia sac completely mobilized and narrowed down to the neck in this location, I opened it in a thin area and explored it- it contained serous fluid but no other structures. And again I was not able to cannulate a defect in the abdominal wall beyond on the level of where the cyst originated just inferior to the inguinal ligament. I elected to ligate the neck of the cyst with 2 ties of 2-0 Vicryl. The sac was excised and sent for routine pathology. The ligated end was not able to be reduced intraperitoneally, remained  in place to fill whatever potential space may have been there and was cauterized to induce scar formation. Hemostasis was ensured within the wound with cautery. The Scarpa's was reapproximated with interrupted 30 Vicryls. The skin was closed with running subcuticular Monocryl and Dermabond.  The patient was then awakened, extubated and taken to PACU in stable condition.   All counts were correct at the completion of the case.

## 2017-05-09 NOTE — Anesthesia Procedure Notes (Signed)
Procedure Name: LMA Insertion Date/Time: 05/09/2017 11:33 AM Performed by: Aiyana Stegmann D Pre-anesthesia Checklist: Patient identified, Emergency Drugs available, Suction available and Patient being monitored Patient Re-evaluated:Patient Re-evaluated prior to inductionOxygen Delivery Method: Circle system utilized Preoxygenation: Pre-oxygenation with 100% oxygen Intubation Type: IV induction Ventilation: Mask ventilation without difficulty LMA: LMA inserted LMA Size: 3.0 Number of attempts: 1 Airway Equipment and Method: Bite block Placement Confirmation: positive ETCO2 Tube secured with: Tape Dental Injury: Teeth and Oropharynx as per pre-operative assessment

## 2017-05-09 NOTE — Discharge Instructions (Signed)
[facility] _______[facility]  POST OP INSTRUCTIONS  Always review your discharge instruction sheet given to you by the facility where your surgery was performed. IF YOU HAVE DISABILITY OR FAMILY LEAVE FORMS, YOU MUST BRING THEM TO THE OFFICE FOR PROCESSING.   DO NOT GIVE THEM TO YOUR DOCTOR.  1. A  prescription for pain medication may be given to you upon discharge.  Take your pain medication as prescribed, if needed.  If narcotic pain medicine is not needed, then you may take acetaminophen (Tylenol) or ibuprofen (Advil) as needed. 2. Take your usually prescribed medications unless otherwise directed. If you need a refill on your pain medication, please contact your pharmacy.  They will contact our office to request authorization. Prescriptions will not be filled after 5 pm or on week-ends. 3. You should follow a light diet the first 24 hours after arrival home, such as soup and crackers, etc.  Be sure to include lots of fluids daily.  Resume your normal diet the day after surgery. 4.Most patients will experience some swelling and bruising around the surgical site.  Ice packs and reclining will help.  Swelling and bruising can take several days to resolve.  6. It is common to experience some constipation if taking pain medication after surgery.  Increasing fluid intake and taking a stool softener (such as Colace) will usually help or prevent this problem from occurring.  A mild laxative (Milk of Magnesia or Miralax) should be taken according to package directions if there are no bowel movements after 48 hours. 7. Unless discharge instructions indicate otherwise, you may remove your bandages 24-48 hours after surgery, and you may shower at that time.  You may have steri-strips (small skin tapes) in place directly over the incision.  These strips should be left on the skin for 7-10 days.  If your surgeon used skin glue on the incision, you may shower in 24 hours.  The glue will flake off over  the next 2-3 weeks.  Any sutures or staples will be removed at the office during your follow-up visit. 8. ACTIVITIES:  You may resume regular (light) daily activities beginning the next day--such as daily self-care, walking, climbing stairs--gradually increasing activities as tolerated.  You may have sexual intercourse when it is comfortable.  Refrain from any heavy lifting or straining until approved by your doctor.  a.You may drive when you are no longer taking prescription pain medication, you can comfortably wear a seatbelt, and you can safely maneuver your car and apply brakes. b.RETURN TO WORK:  1-2 weeks _____________________________________________  9.You should see your doctor in the office for a follow-up appointment approximately 2-3 weeks after your surgery.  Make sure that you call for this appointment within a day or two after you arrive home to insure a convenient appointment time. 10.OTHER INSTRUCTIONS: _________________________    _____________________________________  WHEN TO CALL YOUR DOCTOR: 1. Fever over 101.0 2. Inability to urinate 3. Nausea and/or vomiting 4. Extreme swelling or bruising 5. Continued bleeding from incision. 6. Increased pain, redness, or drainage from the incision  The clinic staff is available to answer your questions during regular business hours.  Please dont hesitate to call and ask to speak to one of the nurses for clinical concerns.  If you have a medical emergency, go to the nearest emergency room or call 911.  A surgeon from [facility] is always on call at the hospital   [address] [place], [place], [place]  [address] ?  [address] [place], [place], [place]   [address] [phone] ? [phone] ? FAX [phone] Web site: [email or web address]    Post Anesthesia Home Care Instructions  Activity: Get plenty of rest for the remainder of the day. A responsible individual must stay with you for 24 hours  following the procedure.  For the next 24 hours, DO NOT: -Drive a car -Paediatric nurse -Drink alcoholic beverages -Take any medication unless instructed by your physician -Make any legal decisions or sign important papers.  Meals: Start with liquid foods such as gelatin or soup. Progress to regular foods as tolerated. Avoid greasy, spicy, heavy foods. If nausea and/or vomiting occur, drink only clear liquids until the nausea and/or vomiting subsides. Call your physician if vomiting continues.  Special Instructions/Symptoms: Your throat may feel dry or sore from the anesthesia or the breathing tube placed in your throat during surgery. If this causes discomfort, gargle with warm salt water. The discomfort should disappear within 24 hours.  If you had a scopolamine patch placed behind your ear for the management of post- operative nausea and/or vomiting:  1. The medication in the patch is effective for 72 hours, after which it should be removed.  Wrap patch in a tissue and discard in the trash. Wash hands thoroughly with soap and water. 2. You may remove the patch earlier than 72 hours if you experience unpleasant side effects which may include dry mouth, dizziness or visual disturbances. 3. Avoid touching the patch. Wash your hands with soap and water after contact with the patch.

## 2017-05-09 NOTE — Interval H&P Note (Signed)
History and Physical Interval Note:  05/09/2017 11:14 AM  Betty Holmes  has presented today for surgery, with the diagnosis of Right inguinal cyst  The various methods of treatment have been discussed with the patient and family. After consideration of risks, benefits and other options for treatment, the patient has consented to  Procedure(s): EXCISION RIGHT INGUINAL CYST (Right) as a surgical intervention .  The patient's history has been reviewed, patient examined, no change in status, stable for surgery.  I have reviewed the patient's chart and labs.  Questions were answered to the patient's satisfaction.     Cordella Nyquist Rich Brave

## 2017-05-09 NOTE — Anesthesia Postprocedure Evaluation (Addendum)
Anesthesia Post Note  Patient: Betty Holmes [facility]  Procedure(s) Performed: Procedure(s) (LRB): EXCISION RIGHT INGUINAL CYST (Right)  Patient location during evaluation: PACU Anesthesia Type: General Level of consciousness: awake and alert Pain management: pain level controlled Vital Signs Assessment: post-procedure vital signs reviewed and stable Respiratory status: spontaneous breathing, nonlabored ventilation, respiratory function stable and patient connected to nasal cannula oxygen Cardiovascular status: blood pressure returned to baseline and stable Postop Assessment: no signs of nausea or vomiting Anesthetic complications: no       Last Vitals:  Vitals:   05/09/17 1245 05/09/17 1300  BP: 97/83 124/83  Pulse: 93 88  Resp: 16 10  Temp:      Last Pain:  Vitals:   05/09/17 1300  TempSrc:   PainSc: [place]

## 2017-05-12 ENCOUNTER — Encounter (HOSPITAL_BASED_OUTPATIENT_CLINIC_OR_DEPARTMENT_OTHER): Payer: Self-pay | Admitting: Surgery

## 2017-05-16 ENCOUNTER — Ambulatory Visit: Payer: Medicare Other | Attending: Nurse Practitioner | Admitting: Physical Therapy

## 2017-05-16 ENCOUNTER — Encounter: Payer: Self-pay | Admitting: Physical Therapy

## 2017-05-16 DIAGNOSIS — M542 Cervicalgia: Secondary | ICD-10-CM | POA: Diagnosis present

## 2017-05-16 DIAGNOSIS — M6281 Muscle weakness (generalized): Secondary | ICD-10-CM

## 2017-05-16 DIAGNOSIS — M62838 Other muscle spasm: Secondary | ICD-10-CM | POA: Diagnosis present

## 2017-05-16 NOTE — Therapy (Signed)
[place], [place], [address] Phone: [phone]   Fax:  [phone]  Physical Therapy Treatment  Patient Details  Name: Betty Holmes MRN: [id number] Date of Birth: March 07, 1956 Referring Provider: Flossie Buffy, NP  Encounter Date: 05/16/2017      PT End of Session - 05/16/17 1158    Visit Number 1   Number of Visits 13   Date for PT Re-Evaluation 07/04/17   PT Start Time 1100   PT Stop Time 1148   PT Time Calculation (min) 48 min   Activity Tolerance Patient tolerated treatment well   Behavior During Therapy [facility] for tasks assessed/performed      Past Medical History:  Diagnosis Date  . Anxiety   . Arthritis   . At high risk for tick borne illness    had abx--test her again--she was okey---at [facility]  . Depression   . Fibromyalgia   . Fibromyalgia     Past Surgical History:  Procedure Laterality Date  . CESAREAN SECTION    . CYST EXCISION Right 05/09/2017   Procedure: EXCISION RIGHT INGUINAL CYST;  Surgeon: Clovis Riley, MD;  Location: [facility];  Service: General;  Laterality: Right;  . KNEE ARTHROSCOPY    . KNEE SURGERY    . OVARIAN CYST REMOVAL      There were no vitals filed for this visit.      Subjective Assessment - 05/16/17 1108    Subjective pt is a 61 y.o F with CC of neck pain for along time with an exacerbation in 2009 with pain in the L shoulder and referral to the L hand. pain continues to fluctuate in the neck with report intermittent pain in bil hands with N/T. reports reoccurence of of HA.    Limitations Lifting   How long can you sit comfortably? 15 min   How long can you stand comfortably? 15 min   How long can you walk comfortably? 15 min   Diagnostic tests MRI 2014   Patient Stated Goals to calm down pain, learn how to avoid pain and proper posture. decrease stiffness   Currently in Pain? Yes   Pain Score 5    Pain  Location Neck   Pain Orientation Right;Left   Pain Descriptors / Indicators Nagging;Aching;Pins and needles;Tightness   Pain Type Chronic pain   Pain Radiating Towards bil hands   Pain Onset More than a month ago   Pain Frequency Intermittent   Aggravating Factors  lifting the arms, washing dushes, pushing down through hands, prolonged neck flexion   Pain Relieving Factors medication, Arnica, turmeric, ice             [facility] PT Assessment - 05/16/17 1102      Assessment   Medical Diagnosis Fibromyalgia, Arthalgia, Myalgia   Referring Provider Nche, Charlene Brooke, NP   Onset Date/Surgical Date --  chronic    Hand Dominance Right   Next MD Visit --  2 weeks   Prior Therapy yes     Precautions   Precautions None     Restrictions   Weight Bearing Restrictions No     Balance Screen   Has the patient fallen in the past 6 months No   Has the patient had a decrease in activity level because of a fear of falling?  No   Is the patient reluctant to leave their home because of a fear of falling?  No  Home Environment   Living Environment Private residence   Living Arrangements Alone   Type of [place] to enter   Entrance Stairs-Number of Steps 2   Entrance Stairs-Rails None   Home Layout One level     Prior Function   Level of Independence Independent;Independent with basic ADLs  difficulty with getting dressed due to hand weakness   Vocation On disability   Leisure singing, chrochet,      Cognition   Overall Cognitive Status Within Functional Limits for tasks assessed     Observation/Other Assessments   Focus on Therapeutic Outcomes (FOTO)  56% limited  predicted 47% limited     Posture/Postural Control   Posture/Postural Control Postural limitations   Postural Limitations Rounded Shoulders;Forward head     ROM / Strength   AROM / PROM / Strength AROM;Strength;PROM     AROM   AROM Assessment Site Cervical   Cervical Flexion 22    Cervical Extension 32   Cervical - Right Side Bend 22  ERP   Cervical - Left Side Bend 18  ERP   Cervical - Right Rotation 46   Cervical - Left Rotation 48     Strength   Strength Assessment Site Shoulder;Hand   Right/Left Shoulder Right;Left   Right Shoulder Flexion 4/5   Right Shoulder Extension 4+/5   Right Shoulder ABduction 4-/5   Right Shoulder Internal Rotation 4-/5   Right Shoulder External Rotation 4/5   Left Shoulder Flexion 4/5   Left Shoulder Extension 4+/5   Left Shoulder ABduction 4-/5   Left Shoulder Internal Rotation 4/5   Left Shoulder External Rotation 4-/5   Right Hand Grip (lbs) --  24,22,16   Left Hand Grip (lbs) 16.3  19,16, 14     Palpation   Palpation comment TTP bil upper trap/ levator scapulse with L>R with mulitple trigger points, sub-occipital tightness                     [facility] Adult PT Treatment/Exercise - 05/16/17 1102      Manual Therapy   Manual therapy comments manual trigger point release of the L upper trap/ levator scapulae                PT Education - 05/16/17 1157    Education provided Yes   Education Details evaluation findings, POC, goals, HEP with form/ rationale, anatomy regarding upper trap/ levator and sub-occipitals   Person(s) Educated Patient   Methods Explanation;Verbal cues;Handout   Comprehension Verbalized understanding;Verbal cues required          PT Short Term Goals - 05/16/17 1203      PT SHORT TERM GOAL #1   Title pt will be I with inital HEp (06/06/2017)   Time 3   Period Weeks   Status New     PT SHORT TERM GOAL #2   Title pt will increase bil grip strength to >/= 5# to demonstate improving function (06/06/2017)   Time 3   Period Weeks   Status New           PT Long Term Goals - 05/16/17 1205      PT LONG TERM GOAL #1   Title pt will be I with all HEP given as of last visit (07/04/2017)   Time 6   Period Weeks   Status New     PT LONG TERM GOAL #2   Title pt will  increase bil shoulder strength  to >/= 4/5 in all planes for functional lifting and carrying activities for ADLs (07/04/2017)   Time 6   Period Weeks   Status New     PT LONG TERM GOAL #3   Title pt will increase bil grip strength to >/= 28 # to assist endurance and dexterity for donning/ doffing clothing (07/04/2017)   Time 6   Period Weeks   Status New     PT LONG TERM GOAL #4   Title Increase cervical mobility by >/= 10 degrees in all planes to with no report of pain and tightness for safety with driving and ADLs (6/55/3748)   Time 6   Period Weeks   Status New     PT LONG TERM GOAL #5   Title Increase FOTO to </= 47% limited to demo improvement in function (07/04/2017)   Time 6   Period Weeks   Status New               Plan - 18-May-2017 1158    Clinical Impression Statement pt is a 61 y.o F who presents to [facility] as a low complexity evaluation with CC or neck pain with referral of N/T to her hands. TTP along bil upper traps/ levator scapulae and sub-occipitals. limited cervical mobility with end range tightness. weakness noted in bil shoulders,  She would benefit from physical therapy to  improve cerivcal mobility, increase strength, reduce pain and maximize her function by addressing the deficits listed.    Rehab Potential Good   PT Frequency 2x / week   PT Duration 6 weeks   PT Treatment/Interventions ADLs/Self Care Home Management;Cryotherapy;Electrical Stimulation;Iontophoresis 4mg /ml Dexamethasone;Moist Heat;Ultrasound;Traction;Therapeutic activities;Therapeutic exercise;Patient/family education;Dry needling;Taping;Manual techniques;Passive range of motion   PT Next Visit Plan assess/ review HEP, manual for upper trap / levator scap, sub-occipital release, stretching, modalities PRN, posture correction exercise   PT Home Exercise Plan upper trap stretch, levator scapulae stretching, chin tuck, rows   Consulted and Agree with Plan of Care Patient      Patient will benefit  from skilled therapeutic intervention in order to improve the following deficits and impairments:  Pain, Improper body mechanics, Postural dysfunction, Increased fascial restricitons, Decreased range of motion, Decreased strength, Decreased endurance, Decreased activity tolerance, Decreased balance  Visit Diagnosis: Cervicalgia - Plan: PT plan of care cert/re-cert  Muscle weakness (generalized) - Plan: PT plan of care cert/re-cert  Other muscle spasm - Plan: PT plan of care cert/re-cert       G-Codes - May 18, 2017 1222-03-23    Functional Assessment Tool Used (Outpatient Only) FOTO/ clinical judgement   Functional Limitation Carrying, moving and handling objects   Carrying, Moving and Handling Objects Current Status (O7078) At least 40 percent but less than 60 percent impaired, limited or restricted   Carrying, Moving and Handling Objects Goal Status (M7544) At least 20 percent but less than 40 percent impaired, limited or restricted      Problem List Patient Active Problem List   Diagnosis Date Noted  . Osteoporosis 04/21/2017  . Cervical stenosis of spine 04/21/2017  . Vitamin D deficiency 04/21/2017  . Recurrent major depressive disorder, in partial remission ([place]) 04/09/2017  . Chronic low back pain 04/09/2017  . Fibromyalgia    [name] PT, DPT, LAT, ATC  05-18-2017  12:29 PM      [facility] [address] [place], [place], [address] Phone: [phone]   Fax:  [phone]  Name: Betty Holmes MRN: [id number] Date of Birth: Sep 19, 1956

## 2017-05-20 ENCOUNTER — Encounter: Payer: Self-pay | Admitting: Physical Therapy

## 2017-05-20 ENCOUNTER — Ambulatory Visit: Payer: Medicare Other | Admitting: Physical Therapy

## 2017-05-20 DIAGNOSIS — M62838 Other muscle spasm: Secondary | ICD-10-CM

## 2017-05-20 DIAGNOSIS — M6281 Muscle weakness (generalized): Secondary | ICD-10-CM

## 2017-05-20 DIAGNOSIS — M542 Cervicalgia: Secondary | ICD-10-CM | POA: Diagnosis not present

## 2017-05-20 NOTE — Therapy (Signed)
[place], [place], [address] Phone: [phone]   Fax:  [phone]  Physical Therapy Treatment  Patient Details  Name: Betty Holmes MRN: [id number] Date of Birth: [birth year] Referring Provider: Flossie Buffy, NP  Encounter Date: 05/20/2017      PT End of Session - 05/20/17 1319    Visit Number 2   Number of Visits 13   Date for PT Re-Evaluation 07/04/17   PT Start Time 1102   PT Stop Time 1200   PT Time Calculation (min) 58 min   Activity Tolerance Patient tolerated treatment well   Behavior During Therapy [facility] for tasks assessed/performed      Past Medical History:  Diagnosis Date  . Anxiety   . Arthritis   . At high risk for tick borne illness    had abx--test her again--she was okey---at [facility]  . Depression   . Fibromyalgia   . Fibromyalgia     Past Surgical History:  Procedure Laterality Date  . CESAREAN SECTION    . CYST EXCISION Right 05/09/2017   Procedure: EXCISION RIGHT INGUINAL CYST;  Surgeon: Clovis Riley, MD;  Location: [place];  Service: General;  Laterality: Right;  . KNEE ARTHROSCOPY    . KNEE SURGERY    . OVARIAN CYST REMOVAL      There were no vitals filed for this visit.      Subjective Assessment - 05/20/17 1105    Subjective 2/10 now.  Gets bad HA.   Currently in Pain? Yes   Pain Score 2   up to 8/10 HA   Pain Location Neck   Pain Orientation Right;Left;Posterior   Pain Descriptors / Indicators Tingling;Numbness  nausea,   Pain Type Chronic pain   Pain Radiating Towards both hands   Aggravating Factors  wakes with a "[place]"     Pain Relieving Factors Meds, Arnica,  ice,  on floor , hanging off a book   Effect of Pain on Daily Activities hard to dress,  hard to grip.  ADL's hard.                         [facility] Adult PT Treatment/Exercise - 05/20/17 0001      Shoulder Exercises: Standing    Row 10 reps   Theraband Level (Shoulder Row) Level 2 (Red)  cues for technique     Shoulder Exercises: Stretch   Corner Stretch Limitations 3 X 30 doorway stretch ,, cues initially   Other Shoulder Stretches neck retraction 2 x correct technique     Moist Heat Therapy   Number Minutes Moist Heat 15 Minutes   Moist Heat Location Cervical  and upper back     Manual Therapy   Manual therapy comments manual trigger point release of the L upper trap/ levator scapulae on side  and instrument assist soft tissue work.neck lateral , pec      Neck Exercises: Stretches   Levator Stretch 3 reps;30 seconds   Levator Stretch Limitations  Cues head position                PT Education - 05/20/17 1319    Education provided Yes   Education Details HEP,  cues   Person(s) Educated Patient   Methods Explanation;Demonstration;Verbal cues   Comprehension Verbalized understanding;Returned demonstration          PT Short Term Goals - 05/20/17 1321  PT SHORT TERM GOAL #1   Title pt will be I with inital HEp (06/06/2017)   Baseline cues needed   Time 3   Period Weeks   Status On-going     PT SHORT TERM GOAL #2   Title pt will increase bil grip strength to >/= 5# to demonstate improving function (06/06/2017)   Time 3   Period Weeks   Status Unable to assess     PT SHORT TERM GOAL #3   Title Pt will demsotrate 5/5 gross B LE strength    Time 4   Period Weeks   Status Unable to assess     PT SHORT TERM GOAL #4   Title Pt will report 2/10 pain at worst   Baseline up to 8/10   Time 4   Period Weeks   Status On-going           PT Long Term Goals - 05/16/17 1205      PT LONG TERM GOAL #1   Title pt will be I with all HEP given as of last visit (07/04/2017)   Time 6   Period Weeks   Status New     PT LONG TERM GOAL #2   Title pt will increase bil shoulder strength to >/= 4/5 in all planes for functional lifting and carrying activities for ADLs (07/04/2017)   Time  6   Period Weeks   Status New     PT LONG TERM GOAL #3   Title pt will increase bil grip strength to >/= 28 # to assist endurance and dexterity for donning/ doffing clothing (07/04/2017)   Time 6   Period Weeks   Status New     PT LONG TERM GOAL #4   Title Increase cervical mobility by >/= 10 degrees in all planes to with no report of pain and tightness for safety with driving and ADLs (03/31/8118)   Time 6   Period Weeks   Status New     PT LONG TERM GOAL #5   Title Increase FOTO to </= 47% limited to demo improvement in function (07/04/2017)   Time 6   Period Weeks   Status New               Plan - 05/20/17 1320    Clinical Impression Statement Patient required minor cues for HEP.  ROM continues to be limited Cervical spine.  No HA today.   PT Next Visit Plan / review HEP, manual for upper trap / levator scap, sub-occipital release, stretching, modalities PRN, posture correction exercise   PT Home Exercise Plan upper trap stretch, levator scapulae stretching, chin tuck, rows   Consulted and Agree with Plan of Care Patient      Patient will benefit from skilled therapeutic intervention in order to improve the following deficits and impairments:  Pain, Improper body mechanics, Postural dysfunction, Increased fascial restricitons, Decreased range of motion, Decreased strength, Decreased endurance, Decreased activity tolerance, Decreased balance  Visit Diagnosis: Cervicalgia  Muscle weakness (generalized)  Other muscle spasm     Problem List Patient Active Problem List   Diagnosis Date Noted  . Osteoporosis 04/21/2017  . Cervical stenosis of spine 04/21/2017  . Vitamin D deficiency 04/21/2017  . Recurrent major depressive disorder, in partial remission ([place]) 04/09/2017  . Chronic low back pain 04/09/2017  . Fibromyalgia     HARRIS,KAREN PTA 05/20/2017, 1:23 PM  [facility] [address] [place], [place],  [address] Phone:  [phone]   Fax:  [phone]  Name: Betty Holmes MRN: [id number] Date of Birth: [birth year]

## 2017-05-22 ENCOUNTER — Ambulatory Visit: Payer: Medicare Other | Admitting: Physical Therapy

## 2017-05-22 DIAGNOSIS — M542 Cervicalgia: Secondary | ICD-10-CM | POA: Diagnosis not present

## 2017-05-22 DIAGNOSIS — M6281 Muscle weakness (generalized): Secondary | ICD-10-CM

## 2017-05-22 NOTE — Patient Instructions (Signed)
Over Head Pull: Narrow Grip       On back, knees bent, feet flat, band across thighs, elbows straight but relaxed. Pull hands apart (start). Keeping elbows straight, bring arms up and over head, hands toward floor. Keep pull steady on band. Hold momentarily. Return slowly, keeping pull steady, back to start. Repeat __10_ times. Band color __R____   Side Pull: Double Arm   On back, knees bent, feet flat. Arms perpendicular to body, shoulder level, elbows straight but relaxed. Pull arms out to sides, elbows straight. Resistance band comes across collarbones, hands toward floor. Hold momentarily. Slowly return to starting position. Repeat 10__ times. Band color __R___   Sash   On back, knees bent, feet flat, left hand on left hip, right hand above left. Pull right arm DIAGONALLY (hip to shoulder) across chest. Bring right arm along head toward floor. Hold momentarily. Slowly return to starting position. Repeat 10___ times. Do with left arm. Band color __R____   Shoulder Rotation: Double Arm   On back, knees bent, feet flat, elbows tucked at sides, bent 90, hands palms up. Pull hands apart and down toward floor, keeping elbows near sides. Hold momentarily. Slowly return to starting position. Repeat _10__ times. Band color __R____    

## 2017-05-22 NOTE — Therapy (Signed)
[place], [place], [address] Phone: [phone]   Fax:  [phone]  Physical Therapy Treatment  Patient Details  Name: Betty Holmes MRN: [id number] Date of Birth: 09-21-1956 Referring Provider: Flossie Buffy, NP  Encounter Date: 05/22/2017      PT End of Session - 05/22/17 1238    Visit Number 3   Number of Visits 13   Date for PT Re-Evaluation 07/04/17   PT Start Time 1324   PT Stop Time 1223   PT Time Calculation (min) 38 min      Past Medical History:  Diagnosis Date  . Anxiety   . Arthritis   . At high risk for tick borne illness    had abx--test her again--she was okey---at [facility]  . Depression   . Fibromyalgia   . Fibromyalgia     Past Surgical History:  Procedure Laterality Date  . CESAREAN SECTION    . CYST EXCISION Right 05/09/2017   Procedure: EXCISION RIGHT INGUINAL CYST;  Surgeon: Clovis Riley, MD;  Location: [place];  Service: General;  Laterality: Right;  . KNEE ARTHROSCOPY    . KNEE SURGERY    . OVARIAN CYST REMOVAL      There were no vitals filed for this visit.      Subjective Assessment - 05/22/17 1149    Subjective The massage last visit really helped a lot.    Currently in Pain? No/denies                         [facility] Adult PT Treatment/Exercise - 05/22/17 0001      Neck Exercises: Supine   Neck Retraction 10 reps   Other Supine Exercise supine scap retraction/shoulder press x 10      Shoulder Exercises: Supine   Other Supine Exercises Supine scap stab series red band x 10 x 2      Shoulder Exercises: Seated   Retraction 10 reps     Shoulder Exercises: Stretch   Other Shoulder Stretches neck retraction x 10      Neck Exercises: Stretches   Upper Trapezius Stretch 3 reps;30 seconds   Levator Stretch 3 reps;30 seconds   Levator Stretch Limitations  Cues head position                 PT Education - 05/22/17 1211    Education provided Yes   Education Details HEP   Person(s) Educated Patient   Methods Explanation;Handout   Comprehension Verbalized understanding          PT Short Term Goals - 05/20/17 1321      PT SHORT TERM GOAL #1   Title pt will be I with inital HEp (06/06/2017)   Baseline cues needed   Time 3   Period Weeks   Status On-going     PT SHORT TERM GOAL #2   Title pt will increase bil grip strength to >/= 5# to demonstate improving function (06/06/2017)   Time 3   Period Weeks   Status Unable to assess     PT SHORT TERM GOAL #3   Title Pt will demsotrate 5/5 gross B LE strength    Time 4   Period Weeks   Status Unable to assess     PT SHORT TERM GOAL #4   Title Pt will report 2/10 pain at worst   Baseline up to 8/10   Time  4   Period Weeks   Status On-going           PT Long Term Goals - 05/16/17 1205      PT LONG TERM GOAL #1   Title pt will be I with all HEP given as of last visit (07/04/2017)   Time 6   Period Weeks   Status New     PT LONG TERM GOAL #2   Title pt will increase bil shoulder strength to >/= 4/5 in all planes for functional lifting and carrying activities for ADLs (07/04/2017)   Time 6   Period Weeks   Status New     PT LONG TERM GOAL #3   Title pt will increase bil grip strength to >/= 28 # to assist endurance and dexterity for donning/ doffing clothing (07/04/2017)   Time 6   Period Weeks   Status New     PT LONG TERM GOAL #4   Title Increase cervical mobility by >/= 10 degrees in all planes to with no report of pain and tightness for safety with driving and ADLs (03/06/1760)   Time 6   Period Weeks   Status New     PT LONG TERM GOAL #5   Title Increase FOTO to </= 47% limited to demo improvement in function (07/04/2017)   Time 6   Period Weeks   Status New               Plan - 05/22/17 1158    Clinical Impression Statement Pt reports significant decrease in pain  after last session. Able to progress exercise without increased pain. Continued posture education.    PT Next Visit Plan / review HEP, manual for upper trap / levator scap, sub-occipital release, stretching, modalities PRN, posture correction exercise   PT Home Exercise Plan upper trap stretch, levator scapulae stretching, chin tuck, rows, supine scap stab series red band    Consulted and Agree with Plan of Care Patient      Patient will benefit from skilled therapeutic intervention in order to improve the following deficits and impairments:  Pain, Improper body mechanics, Postural dysfunction, Increased fascial restricitons, Decreased range of motion, Decreased strength, Decreased endurance, Decreased activity tolerance, Decreased balance  Visit Diagnosis: Muscle weakness (generalized)  Cervicalgia     Problem List Patient Active Problem List   Diagnosis Date Noted  . Osteoporosis 04/21/2017  . Cervical stenosis of spine 04/21/2017  . Vitamin D deficiency 04/21/2017  . Recurrent major depressive disorder, in partial remission ([place]) 04/09/2017  . Chronic low back pain 04/09/2017  . Fibromyalgia     Dorene Ar, [place] 05/22/2017, 12:39 PM  [facility] [address] [place], [place], [address] Phone: [phone]   Fax:  [phone]  Name: Betty Holmes MRN: [id number] Date of Birth: [birth year]

## 2017-05-23 NOTE — Progress Notes (Signed)
Office Surgery DOS 04.23.2018 Excision of soft tissue mass left foot.

## 2017-05-23 NOTE — Addendum Note (Signed)
Addendum  created 05/23/17 1226 by Effie Berkshire, MD   Sign clinical note

## 2017-05-27 ENCOUNTER — Ambulatory Visit: Payer: Medicare Other | Attending: Nurse Practitioner | Admitting: Physical Therapy

## 2017-05-27 DIAGNOSIS — M62838 Other muscle spasm: Secondary | ICD-10-CM | POA: Diagnosis present

## 2017-05-27 DIAGNOSIS — M6281 Muscle weakness (generalized): Secondary | ICD-10-CM | POA: Diagnosis present

## 2017-05-27 DIAGNOSIS — M542 Cervicalgia: Secondary | ICD-10-CM | POA: Diagnosis present

## 2017-05-27 NOTE — Therapy (Signed)
[place], [place], [address] Phone: [phone]   Fax:  [phone]  Physical Therapy Treatment  Patient Details  Name: Betty Holmes MRN: [id number] Date of Birth: [birth year] Referring Provider: Flossie Buffy, NP  Encounter Date: 05/27/2017      PT End of Session - 05/27/17 1508    Visit Number 3   Number of Visits 13   Date for PT Re-Evaluation 07/04/17   PT Start Time 0300   PT Stop Time 0400   PT Time Calculation (min) 60 min      Past Medical History:  Diagnosis Date  . Anxiety   . Arthritis   . At high risk for tick borne illness    had abx--test her again--she was okey---at [facility] [employer]  . Depression   . Fibromyalgia   . Fibromyalgia     Past Surgical History:  Procedure Laterality Date  . CESAREAN SECTION    . CYST EXCISION Right 05/09/2017   Procedure: EXCISION RIGHT INGUINAL CYST;  Surgeon: Clovis Riley, MD;  Location: [place];  Service: General;  Laterality: Right;  . KNEE ARTHROSCOPY    . KNEE SURGERY    . OVARIAN CYST REMOVAL      There were no vitals filed for this visit.      Subjective Assessment - 05/27/17 1506    Subjective I have been really watching my posture. I woke up with a headache.    Currently in Pain? Yes   Pain Score 2    Pain Location Head   Pain Orientation --  frontal   Aggravating Factors  first wake up, looking down    Pain Relieving Factors meds            [facility] PT Assessment - 05/27/17 0001      AROM   Cervical Flexion 45   Cervical Extension 32   Cervical - Right Side Bend 28   Cervical - Left Side Bend 26   Cervical - Right Rotation 45   Cervical - Left Rotation 40                     [facility] Adult PT Treatment/Exercise - 05/27/17 0001      Shoulder Exercises: Supine   Other Supine Exercises Supine scap stab series red band x 10      Moist Heat Therapy   Number Minutes  Moist Heat 15 Minutes   Moist Heat Location Cervical     Manual Therapy   Manual therapy comments manual trigger point release of the L upper trap/ levator scapulae on side     Neck Exercises: Stretches   Upper Trapezius Stretch 3 reps;30 seconds   Levator Stretch 3 reps;30 seconds   Levator Stretch Limitations no over pressure                   PT Short Term Goals - 05/27/17 1527      PT SHORT TERM GOAL #1   Title pt will be I with inital HEp (06/06/2017)   Baseline cues needed   Time 3   Period Weeks   Status On-going     PT SHORT TERM GOAL #2   Title pt will increase bil grip strength to >/= 5# to demonstate improving function (06/06/2017)   Time 3   Period Weeks   Status Unable to assess     PT SHORT TERM GOAL #3   Title --  Time --   Period --   Status --           PT Long Term Goals - 05/16/17 1205      PT LONG TERM GOAL #1   Title pt will be I with all HEP given as of last visit (07/04/2017)   Time 6   Period Weeks   Status New     PT LONG TERM GOAL #2   Title pt will increase bil shoulder strength to >/= 4/5 in all planes for functional lifting and carrying activities for ADLs (07/04/2017)   Time 6   Period Weeks   Status New     PT LONG TERM GOAL #3   Title pt will increase bil grip strength to >/= 28 # to assist endurance and dexterity for donning/ doffing clothing (07/04/2017)   Time 6   Period Weeks   Status New     PT LONG TERM GOAL #4   Title Increase cervical mobility by >/= 10 degrees in all planes to with no report of pain and tightness for safety with driving and ADLs (07/25/2121)   Time 6   Period Weeks   Status New     PT LONG TERM GOAL #5   Title Increase FOTO to </= 47% limited to demo improvement in function (07/04/2017)   Time 6   Period Weeks   Status New               Plan - 05/27/17 1524    Clinical Impression Statement Pt reports exercises are very helpful. She is still having frontal headaches. Her neck  AROM has improved in all planes except rotation. She atributes this to nausea which occurs with end range rotation. We reviewed her HEP with cues to perform levator stretch without over pressure for now. She also was not doing some of her last issued HEP. Manual to neck to increase ROM and decrease tension. HMP at end of treatment. No increased pain.    PT Next Visit Plan / review HEP, manual for upper trap / levator scap, sub-occipital release, stretching, modalities PRN, posture correction exercise   PT Home Exercise Plan upper trap stretch, levator scapulae stretching, chin tuck, rows, supine scap stab series red band    Consulted and Agree with Plan of Care Patient      Patient will benefit from skilled therapeutic intervention in order to improve the following deficits and impairments:  Pain, Improper body mechanics, Postural dysfunction, Increased fascial restricitons, Decreased range of motion, Decreased strength, Decreased endurance, Decreased activity tolerance, Decreased balance  Visit Diagnosis: Muscle weakness (generalized)  Cervicalgia  Other muscle spasm     Problem List Patient Active Problem List   Diagnosis Date Noted  . Osteoporosis 04/21/2017  . Cervical stenosis of spine 04/21/2017  . Vitamin D deficiency 04/21/2017  . Recurrent major depressive disorder, in partial remission ([place]) 04/09/2017  . Chronic low back pain 04/09/2017  . Fibromyalgia     Dorene Ar, PTA 05/27/2017, 3:45 PM  [facility] [address] [place], [place], [address] Phone: [phone]   Fax:  [phone]  Name: Betty Holmes MRN: [id number] Date of Birth: 07/29/1956

## 2017-05-29 ENCOUNTER — Encounter: Payer: Self-pay | Admitting: Physical Therapy

## 2017-05-29 ENCOUNTER — Ambulatory Visit: Payer: Medicare Other | Admitting: Physical Therapy

## 2017-05-29 DIAGNOSIS — M6281 Muscle weakness (generalized): Secondary | ICD-10-CM | POA: Diagnosis not present

## 2017-05-29 DIAGNOSIS — M62838 Other muscle spasm: Secondary | ICD-10-CM

## 2017-05-29 DIAGNOSIS — M542 Cervicalgia: Secondary | ICD-10-CM

## 2017-05-29 NOTE — Therapy (Signed)
[place], [place], [address] Phone: [phone]   Fax:  [phone]  Physical Therapy Treatment  Patient Details  Name: Betty Holmes MRN: [id number] Date of Birth: 04-22-1956 Referring Provider: Flossie Buffy, NP  Encounter Date: 05/29/2017      PT End of Session - 05/29/17 1055    Visit Number 4   Number of Visits 13   Date for PT Re-Evaluation 07/04/17   PT Start Time 1013   PT Stop Time 1052   PT Time Calculation (min) 39 min   Activity Tolerance Patient tolerated treatment well   Behavior During Therapy [facility] for tasks assessed/performed      Past Medical History:  Diagnosis Date  . Anxiety   . Arthritis   . At high risk for tick borne illness    had abx--test her again--she was okey---at [facility]  . Depression   . Fibromyalgia   . Fibromyalgia     Past Surgical History:  Procedure Laterality Date  . CESAREAN SECTION    . CYST EXCISION Right 05/09/2017   Procedure: EXCISION RIGHT INGUINAL CYST;  Surgeon: Clovis Riley, MD;  Location: [place];  Service: General;  Laterality: Right;  . KNEE ARTHROSCOPY    . KNEE SURGERY    . OVARIAN CYST REMOVAL      There were no vitals filed for this visit.      Subjective Assessment - 05/29/17 1019    Subjective "doing better since the last session, only having pain the hands which im unsure if it is coming from the neck"    Currently in Pain? No/denies   Pain Score 0-No pain   Pain Location Head   Pain Type Chronic pain   Pain Onset More than a month ago   Pain Frequency Intermittent                         [facility] Adult PT Treatment/Exercise - 05/29/17 1052      Neck Exercises: Seated   Neck Retraction 10 reps;10 secs     Shoulder Exercises: Seated   Other Seated Exercises bil thoracic rotation with arms crossed 2 x 10   Other Seated Exercises thoracic extension ove rback of  chair 2 x 10  cues to keep chin tucked for good positioning     Manual Therapy   Manual Therapy Joint mobilization   Manual therapy comments manual trigger point release of the L upper trap/ levator scapulae bil and scalenes   Joint Mobilization bil 1st rib mobs grade 3 with pt inspiration/ expiration, C3-C7 PA mobs grade 3     Neck Exercises: Stretches   Upper Trapezius Stretch 2 reps;30 seconds   Levator Stretch 2 reps;30 seconds                PT Education - 05/29/17 1054    Education provided Yes   Education Details reviewed HEP and updated for thoracic mobility and education for benefits regarding cervical mobility   Person(s) Educated Patient   Methods Explanation;Verbal cues;Handout   Comprehension Verbalized understanding;Verbal cues required          PT Short Term Goals - 05/27/17 1527      PT SHORT TERM GOAL #1   Title pt will be I with inital HEp (06/06/2017)   Baseline cues needed   Time 3   Period Weeks   Status On-going  PT SHORT TERM GOAL #2   Title pt will increase bil grip strength to >/= 5# to demonstate improving function (06/06/2017)   Time 3   Period Weeks   Status Unable to assess     PT SHORT TERM GOAL #3   Title --   Time --   Period --   Status --           PT Long Term Goals - 05/16/17 1205      PT LONG TERM GOAL #1   Title pt will be I with all HEP given as of last visit (07/04/2017)   Time 6   Period Weeks   Status New     PT LONG TERM GOAL #2   Title pt will increase bil shoulder strength to >/= 4/5 in all planes for functional lifting and carrying activities for ADLs (07/04/2017)   Time 6   Period Weeks   Status New     PT LONG TERM GOAL #3   Title pt will increase bil grip strength to >/= 28 # to assist endurance and dexterity for donning/ doffing clothing (07/04/2017)   Time 6   Period Weeks   Status New     PT LONG TERM GOAL #4   Title Increase cervical mobility by >/= 10 degrees in all planes to with no  report of pain and tightness for safety with driving and ADLs (9/62/8366)   Time 6   Period Weeks   Status New     PT LONG TERM GOAL #5   Title Increase FOTO to </= 47% limited to demo improvement in function (07/04/2017)   Time 6   Period Weeks   Status New               Plan - 05/29/17 1055    Clinical Impression Statement pt reported improvement in the neck with some soreness inthe thumbs which she reports could be from the neck. continued manual trigger point techniques and cervical/ 1st rib mobs which she rpeorted relief of tightness. updated HEP for throacic mobility techniques. no pain post session and she declined modalities post session.    PT Next Visit Plan  update HEP PRN, manual for upper trap / levator scap, sub-occipital release, stretching, modalities PRN, posture correction exercise   PT Home Exercise Plan upper trap stretch, levator scapulae stretching, chin tuck, rows, supine scap stab series red band    Consulted and Agree with Plan of Care Patient      Patient will benefit from skilled therapeutic intervention in order to improve the following deficits and impairments:  Pain, Improper body mechanics, Postural dysfunction, Increased fascial restricitons, Decreased range of motion, Decreased strength, Decreased endurance, Decreased activity tolerance, Decreased balance  Visit Diagnosis: Muscle weakness (generalized)  Cervicalgia  Other muscle spasm     Problem List Patient Active Problem List   Diagnosis Date Noted  . Osteoporosis 04/21/2017  . Cervical stenosis of spine 04/21/2017  . Vitamin D deficiency 04/21/2017  . Recurrent major depressive disorder, in partial remission ([place]) 04/09/2017  . Chronic low back pain 04/09/2017  . Fibromyalgia    [name] PT, DPT, LAT, ATC  05/29/17  10:58 AM      [facility] [address] [place], [place], [address] Phone: [phone]   Fax:   [phone]  Name: Betty Holmes MRN: [id number] Date of Birth: September 19, 1956

## 2017-06-02 ENCOUNTER — Ambulatory Visit: Payer: Medicare Other

## 2017-06-03 ENCOUNTER — Ambulatory Visit: Payer: Medicare Other | Admitting: Physical Therapy

## 2017-06-03 ENCOUNTER — Encounter: Payer: Self-pay | Admitting: Physical Therapy

## 2017-06-03 DIAGNOSIS — M6281 Muscle weakness (generalized): Secondary | ICD-10-CM

## 2017-06-03 DIAGNOSIS — M62838 Other muscle spasm: Secondary | ICD-10-CM

## 2017-06-03 DIAGNOSIS — M542 Cervicalgia: Secondary | ICD-10-CM

## 2017-06-03 NOTE — Therapy (Signed)
[place], [place], [address] Phone: [phone]   Fax:  [phone]  Physical Therapy Treatment  Patient Details  Name: Betty Holmes MRN: [id number] Date of Birth: 06/26/1956 Referring Provider: Flossie Buffy, NP  Encounter Date: 06/03/2017      PT End of Session - 06/03/17 1058    Visit Number 5   Number of Visits 13   Date for PT Re-Evaluation 07/04/17   PT Start Time 1018   PT Stop Time 1058   PT Time Calculation (min) 40 min   Activity Tolerance Patient tolerated treatment well   Behavior During Therapy [facility] for tasks assessed/performed      Past Medical History:  Diagnosis Date  . Anxiety   . Arthritis   . At high risk for tick borne illness    had abx--test her again--she was okey---at [facility]  . Depression   . Fibromyalgia   . Fibromyalgia     Past Surgical History:  Procedure Laterality Date  . CESAREAN SECTION    . CYST EXCISION Right 05/09/2017   Procedure: EXCISION RIGHT INGUINAL CYST;  Surgeon: Clovis Riley, MD;  Location: [place];  Service: General;  Laterality: Right;  . KNEE ARTHROSCOPY    . KNEE SURGERY    . OVARIAN CYST REMOVAL      There were no vitals filed for this visit.      Subjective Assessment - 06/03/17 1020    Subjective "I am have more upper back pain today, could be weather, more exercise"    Currently in Pain? Yes   Pain Score 4   took ibuprofen at 9am   Pain Location Head   Pain Orientation Left   Pain Type Chronic pain   Pain Onset More than a month ago   Pain Frequency Intermittent   Aggravating Factors  household activities ex: sweeping, cleaning.    Pain Relieving Factors meds,                          [facility] Adult PT Treatment/Exercise - 06/03/17 1035      Shoulder Exercises: Seated   Retraction Strengthening;Both;10 reps  with ER (MONEY) 2sets with red theraband   Theraband Level (Shoulder ABduction) Level 2 (Red)  2 x 10   Other Seated Exercises first rib self mobs using bed sheet 1 x 10 , lower trap strengthening with elbows on bolster and manual resistance 2 x 10  updated for HEP.    Other Seated Exercises scapular retraction 2 x 10  red theraband     Manual Therapy   Manual therapy comments manual trigger point release of the L upper trap/ levator scapulae bil and scalenes   Joint Mobilization bil 1st rib mobs grade 3 with pt inspiration/ expiration, C3-C7 PA mobs grade 3, T1-T4 costovertebral mobs grade 3                PT Education - 06/03/17 1036    Education provided Yes   Education Details updated HEP for 1st rib mobs, how to perform manual trigger point release/ sub-occipital release with tennis balls.   Person(s) Educated Patient   Methods Explanation;Verbal cues;Handout   Comprehension Verbalized understanding;Verbal cues required          PT Short Term Goals - 05/27/17 1527      PT SHORT TERM GOAL #1   Title pt will be I with inital HEp (  06/06/2017)   Baseline cues needed   Time 3   Period Weeks   Status On-going     PT SHORT TERM GOAL #2   Title pt will increase bil grip strength to >/= 5# to demonstate improving function (06/06/2017)   Time 3   Period Weeks   Status Unable to assess     PT SHORT TERM GOAL #3   Title --   Time --   Period --   Status --           PT Long Term Goals - 05/16/17 1205      PT LONG TERM GOAL #1   Title pt will be I with all HEP given as of last visit (07/04/2017)   Time 6   Period Weeks   Status New     PT LONG TERM GOAL #2   Title pt will increase bil shoulder strength to >/= 4/5 in all planes for functional lifting and carrying activities for ADLs (07/04/2017)   Time 6   Period Weeks   Status New     PT LONG TERM GOAL #3   Title pt will increase bil grip strength to >/= 28 # to assist endurance and dexterity for donning/ doffing clothing (07/04/2017)   Time 6    Period Weeks   Status New     PT LONG TERM GOAL #4   Title Increase cervical mobility by >/= 10 degrees in all planes to with no report of pain and tightness for safety with driving and ADLs (6/76/7209)   Time 6   Period Weeks   Status New     PT LONG TERM GOAL #5   Title Increase FOTO to </= 47% limited to demo improvement in function (07/04/2017)   Time 6   Period Weeks   Status New               Plan - 06/03/17 1058    Clinical Impression Statement pt reported increased soreness inthe L upper back/ shoulder region today. continued manual trigger point release techniques and stretching which she reported improvement in pain. Following 1st rib manual mobs and self mobs and scapular stability strengthening she reported no pain. updated HEP for 1st rib self mobs.    PT Treatment/Interventions ADLs/Self Care Home Management;Cryotherapy;Electrical Stimulation;Iontophoresis 4mg /ml Dexamethasone;Moist Heat;Ultrasound;Traction;Therapeutic activities;Therapeutic exercise;Patient/family education;Dry needling;Taping;Manual techniques;Passive range of motion   PT Next Visit Plan  ROM, update HEP PRN, manual for upper trap / levator scap, sub-occipital release, stretching, modalities PRN, posture correction exercise   PT Home Exercise Plan upper trap stretch, levator scapulae stretching, chin tuck, rows, supine scap stab series red band, 1st rib mobs   Consulted and Agree with Plan of Care Patient      Patient will benefit from skilled therapeutic intervention in order to improve the following deficits and impairments:  Pain, Improper body mechanics, Postural dysfunction, Increased fascial restricitons, Decreased range of motion, Decreased strength, Decreased endurance, Decreased activity tolerance, Decreased balance  Visit Diagnosis: Muscle weakness (generalized)  Cervicalgia  Other muscle spasm     Problem List Patient Active Problem List   Diagnosis Date Noted  . Osteoporosis  04/21/2017  . Cervical stenosis of spine 04/21/2017  . Vitamin D deficiency 04/21/2017  . Recurrent major depressive disorder, in partial remission ([place]) 04/09/2017  . Chronic low back pain 04/09/2017  . Fibromyalgia     Marcianna Daily PT, DPT, LAT, ATC  06/03/17  11:01 AM      [facility]  [place], [place], [address] Phone: [phone]   Fax:  [phone]  Name: Betty Holmes MRN: [id number] Date of Birth: [birth year]

## 2017-06-05 ENCOUNTER — Ambulatory Visit: Payer: Medicare Other | Admitting: Physical Therapy

## 2017-06-05 ENCOUNTER — Encounter: Payer: Self-pay | Admitting: Physical Therapy

## 2017-06-05 DIAGNOSIS — M542 Cervicalgia: Secondary | ICD-10-CM

## 2017-06-05 DIAGNOSIS — M6281 Muscle weakness (generalized): Secondary | ICD-10-CM | POA: Diagnosis not present

## 2017-06-05 DIAGNOSIS — M62838 Other muscle spasm: Secondary | ICD-10-CM

## 2017-06-05 NOTE — Therapy (Signed)
[place], [place], [address] Phone: [phone]   Fax:  [phone]  Physical Therapy Treatment  Patient Details  Name: Betty Holmes MRN: [id number] Date of Birth: 1956-10-24 Referring Provider: Flossie Buffy, NP  Encounter Date: 06/05/2017      PT End of Session - 06/05/17 1018    Visit Number 6   Number of Visits 13   Date for PT Re-Evaluation 07/04/17   PT Start Time 6237   PT Stop Time 1056   PT Time Calculation (min) 38 min   Activity Tolerance Patient tolerated treatment well   Behavior During Therapy [facility] for tasks assessed/performed      Past Medical History:  Diagnosis Date  . Anxiety   . Arthritis   . At high risk for tick borne illness    had abx--test her again--she was okey---at [facility]  . Depression   . Fibromyalgia   . Fibromyalgia     Past Surgical History:  Procedure Laterality Date  . CESAREAN SECTION    . CYST EXCISION Right 05/09/2017   Procedure: EXCISION RIGHT INGUINAL CYST;  Surgeon: Clovis Riley, MD;  Location: [place];  Service: General;  Laterality: Right;  . KNEE ARTHROSCOPY    . KNEE SURGERY    . OVARIAN CYST REMOVAL      There were no vitals filed for this visit.      Subjective Assessment - 06/05/17 1017    Subjective "since last visit the pinch went away and no pain today"    Currently in Pain? No/denies   Pain Score 0-No pain                         [facility] Adult PT Treatment/Exercise - 06/05/17 1020      Shoulder Exercises: Supine   Other Supine Exercises Supine scap stab horizontal abduction, bil ceiling punches, snow angels, alternating ceiling punches,  X to Y ,  2 x 10 ea.     Shoulder Exercises: Seated   Retraction Strengthening;Both;10 reps  with ER bil   Other Seated Exercises lower trap activation 2 x 12 yellow theraband     Shoulder Exercises: Standing   Extension  Strengthening;Both;15 reps;Theraband   Theraband Level (Shoulder Extension) Level 2 (Red)   Row Strengthening;Both;Theraband;15 reps   Theraband Level (Shoulder Row) Level 2 (Red)     Shoulder Exercises: ROM/Strengthening   UBE (Upper Arm Bike) L1 x 5 min  chanding direction at 2:30 sec     Neck Exercises: Stretches   Levator Stretch 2 reps;30 seconds                PT Education - 06/05/17 1051    Education provided Yes   Education Details updated HEP forlower trap activation. utilized skeleton to further explain Costovertebral joints benefits for previous HEP, where specific muscles attach. provided theraband for strengthening.    Person(s) Educated Patient   Methods Explanation;Verbal cues;Handout  used skeleton for education   Comprehension Verbalized understanding;Verbal cues required          PT Short Term Goals - 06/05/17 1055      PT SHORT TERM GOAL #1   Title pt will be I with inital HEp (06/06/2017)   Time 3   Period Weeks   Status Achieved     PT SHORT TERM GOAL #2   Title pt will increase bil grip strength to >/= 5# to  demonstate improving function (06/06/2017)   Time 3   Period Weeks   Status Unable to assess           PT Long Term Goals - 05/16/17 1205      PT LONG TERM GOAL #1   Title pt will be I with all HEP given as of last visit (07/04/2017)   Time 6   Period Weeks   Status New     PT LONG TERM GOAL #2   Title pt will increase bil shoulder strength to >/= 4/5 in all planes for functional lifting and carrying activities for ADLs (07/04/2017)   Time 6   Period Weeks   Status New     PT LONG TERM GOAL #3   Title pt will increase bil grip strength to >/= 28 # to assist endurance and dexterity for donning/ doffing clothing (07/04/2017)   Time 6   Period Weeks   Status New     PT LONG TERM GOAL #4   Title Increase cervical mobility by >/= 10 degrees in all planes to with no report of pain and tightness for safety with driving and ADLs  (08/03/7516)   Time 6   Period Weeks   Status New     PT LONG TERM GOAL #5   Title Increase FOTO to </= 47% limited to demo improvement in function (07/04/2017)   Time 6   Period Weeks   Status New               Plan - 06/05/17 1052    Clinical Impression Statement pt reports no pain in the shoulder today. Reviewed previously provided HEP and educated anatomy of area's involved using skeleton. Due to pt reporting no pain focused on scapular stability exercises which she reported buring durng the exercise but no pain. she declined modalities post session. If she continues to do well plan to continued 1-2 more visits to finalize HEP and meet any remaining goals then D/C.    PT Next Visit Plan scapular stability, review/ update HEP PRN, posture education with specific positions   PT Home Exercise Plan upper trap stretch, levator scapulae stretching, chin tuck, rows, supine scap stab series red band, 1st rib mobs, lower trap activation   Consulted and Agree with Plan of Care Patient      Patient will benefit from skilled therapeutic intervention in order to improve the following deficits and impairments:     Visit Diagnosis: Muscle weakness (generalized)  Cervicalgia  Other muscle spasm     Problem List Patient Active Problem List   Diagnosis Date Noted  . Osteoporosis 04/21/2017  . Cervical stenosis of spine 04/21/2017  . Vitamin D deficiency 04/21/2017  . Recurrent major depressive disorder, in partial remission ([place]) 04/09/2017  . Chronic low back pain 04/09/2017  . Fibromyalgia    [name] PT, DPT, LAT, ATC  06/05/17  10:56 AM      [facility] [address] [place], [place], [address] Phone: [phone]   Fax:  [phone]  Name: Betty Holmes MRN: [id number] Date of Birth: June 08, 1956

## 2017-06-09 ENCOUNTER — Encounter: Payer: Self-pay | Admitting: Physical Therapy

## 2017-06-09 ENCOUNTER — Ambulatory Visit: Payer: Medicare Other | Admitting: Physical Therapy

## 2017-06-09 DIAGNOSIS — M6281 Muscle weakness (generalized): Secondary | ICD-10-CM

## 2017-06-09 DIAGNOSIS — M542 Cervicalgia: Secondary | ICD-10-CM

## 2017-06-09 DIAGNOSIS — M62838 Other muscle spasm: Secondary | ICD-10-CM

## 2017-06-09 NOTE — Therapy (Signed)
[place], [place], [address] Phone: [phone]   Fax:  [phone]  Physical Therapy Treatment / Discharge summary  Patient Details  Name: Betty Holmes MRN: [id number] Date of Birth: 1956/11/19 Referring Provider: Flossie Buffy, NP  Encounter Date: 06/09/2017      PT End of Session - 06/09/17 1028    Visit Number 7   Number of Visits 13   Date for PT Re-Evaluation 07/04/17   PT Start Time 1019   PT Stop Time 1046   PT Time Calculation (min) 27 min   Activity Tolerance Patient tolerated treatment well   Behavior During Therapy [facility] for tasks assessed/performed      Past Medical History:  Diagnosis Date  . Anxiety   . Arthritis   . At high risk for tick borne illness    had abx--test her again--she was okey---at [facility]  . Depression   . Fibromyalgia   . Fibromyalgia     Past Surgical History:  Procedure Laterality Date  . CESAREAN SECTION    . CYST EXCISION Right 05/09/2017   Procedure: EXCISION RIGHT INGUINAL CYST;  Surgeon: Clovis Riley, MD;  Location: [place];  Service: General;  Laterality: Right;  . KNEE ARTHROSCOPY    . KNEE SURGERY    . OVARIAN CYST REMOVAL      There were no vitals filed for this visit.      Subjective Assessment - 06/09/17 1020    Subjective "no pain inthe shoulder, I am still working on my posture"   Currently in Pain? No/denies            [facility] PT Assessment - 06/09/17 1021      Observation/Other Assessments   Focus on Therapeutic Outcomes (FOTO)  49% limited     AROM   Cervical Flexion 45   Cervical Extension 32   Cervical - Right Side Bend 28   Cervical - Left Side Bend 26   Cervical - Right Rotation 45   Cervical - Left Rotation 40     Strength   Right Shoulder Flexion 4+/5   Right Shoulder Extension 5/5   Right Shoulder ABduction 4/5   Right Shoulder Internal Rotation 4+/5   Right  Shoulder External Rotation 4+/5   Left Shoulder Flexion 4+/5   Left Shoulder Extension 5/5   Left Shoulder ABduction 4/5   Left Shoulder Internal Rotation 4+/5   Left Shoulder External Rotation 4+/5   Left Hand Grip (lbs) 25.3  23,27,26                     [facility] Adult PT Treatment/Exercise - 06/09/17 0001      Shoulder Exercises: Seated   Retraction Strengthening;Both;10 reps   Other Seated Exercises lower trap activation 2 x 12 yellow theraband     Neck Exercises: Stretches   Upper Trapezius Stretch 2 reps;30 seconds   Levator Stretch 2 reps;30 seconds                PT Education - 06/09/17 1059    Education provided Yes   Education Details reviewed previously provided HEP. How to progress HEP by increasing reps/ sets to continue to progressing function, provided updated theraband.   Person(s) Educated Patient   Methods Explanation;Verbal cues   Comprehension Verbalized understanding;Verbal cues required          PT Short Term Goals - 06/09/17 1029  PT SHORT TERM GOAL #1   Title pt will be I with inital HEp (06/06/2017)   Time 3   Period Weeks   Status Achieved     PT SHORT TERM GOAL #2   Title pt will increase bil grip strength to >/= 5# to demonstate improving function (06/06/2017)   Time 3   Period Weeks   Status Achieved           PT Long Term Goals - 06-26-17 1032      PT LONG TERM GOAL #1   Title pt will be I with all HEP given as of last visit (07/04/2017)   Time 6   Period Weeks   Status Achieved     PT LONG TERM GOAL #2   Title pt will increase bil shoulder strength to >/= 4/5 in all planes for functional lifting and carrying activities for ADLs (07/04/2017)   Time 6   Period Weeks   Status Achieved     PT LONG TERM GOAL #3   Title pt will increase bil grip strength to >/= 28 # to assist endurance and dexterity for donning/ doffing clothing (07/04/2017)   Baseline 25.6#   Period Weeks   Status Partially Met     PT  LONG TERM GOAL #4   Title Increase cervical mobility by >/= 10 degrees in all planes to with no report of pain and tightness for safety with driving and ADLs (03/24/271)   Time 6   Period Weeks   Status Achieved     PT LONG TERM GOAL #5   Title Increase FOTO to </= 47% limited to demo improvement in function (07/04/2017)   Time 6   Period Weeks   Status Achieved               Plan - Jun 26, 2017 1100    Clinical Impression Statement pt has made great progress with physical therapy improving her neck mobility and L shoulder strength. She reports no pain inthe neck and L shoulder area. She has met or partially met all goals. Reviewed previousl provided HEP and improtance of continued exercise. She reports she feels confident she is able to maintain and progress her current level of function independently and will be discharged from PT today.    PT Next Visit Plan D/C   PT Home Exercise Plan upper trap stretch, levator scapulae stretching, chin tuck, rows, supine scap stab series red band, 1st rib mobs, lower trap activation   Consulted and Agree with Plan of Care Patient      Patient will benefit from skilled therapeutic intervention in order to improve the following deficits and impairments:  Pain, Improper body mechanics, Postural dysfunction, Increased fascial restricitons, Decreased range of motion, Decreased strength, Decreased endurance, Decreased activity tolerance, Decreased balance  Visit Diagnosis: Muscle weakness (generalized)  Cervicalgia  Other muscle spasm       G-Codes - 26-Jun-2017 1102    Functional Assessment Tool Used (Outpatient Only) FOTO/ clinical judgement   Functional Limitation Carrying, moving and handling objects   Carrying, Moving and Handling Objects Goal Status ([id number]) At least 20 percent but less than 40 percent impaired, limited or restricted   Carrying, Moving and Handling Objects Discharge Status [phone]) At least 20 percent but less than 40 percent  impaired, limited or restricted      Problem List Patient Active Problem List   Diagnosis Date Noted  . Osteoporosis 04/21/2017  . Cervical stenosis of spine 04/21/2017  . Vitamin D deficiency  04/21/2017  . Recurrent major depressive disorder, in partial remission ([place]) 04/09/2017  . Chronic low back pain 04/09/2017  . Fibromyalgia    Citlalli Weikel PT, DPT, LAT, ATC  06/09/17  11:03 AM      [facility] [address] [place], [place], [address] Phone: [phone]   Fax:  [phone]  Name: Betty Holmes MRN: [id number] Date of Birth: 04-Apr-1956       PHYSICAL THERAPY DISCHARGE SUMMARY  Visits from Start of Care: 7  Current functional level related to goals / functional outcomes: See goals FOTO 49% limited   Remaining deficits: See above assessment   Education / Equipment: HEP, posture/ biomechanics, theraband,   Plan: Patient agrees to discharge.  Patient goals were partially met. Patient is being discharged due to being pleased with the current functional level.  ?????    Lavonda Thal PT, DPT, LAT, ATC  06/09/17  11:04 AM

## 2017-06-10 ENCOUNTER — Encounter: Payer: Medicare Other | Admitting: Physical Therapy

## 2017-06-11 ENCOUNTER — Ambulatory Visit: Payer: Medicare Other | Admitting: Physical Therapy

## 2017-06-12 ENCOUNTER — Encounter: Payer: Medicare Other | Admitting: Physical Therapy

## 2017-06-30 ENCOUNTER — Encounter: Payer: Self-pay | Admitting: Podiatry

## 2017-06-30 ENCOUNTER — Ambulatory Visit (INDEPENDENT_AMBULATORY_CARE_PROVIDER_SITE_OTHER): Payer: Medicare Other | Admitting: Podiatry

## 2017-06-30 DIAGNOSIS — M659 Synovitis and tenosynovitis, unspecified: Secondary | ICD-10-CM | POA: Diagnosis not present

## 2017-07-01 ENCOUNTER — Ambulatory Visit (HOSPITAL_COMMUNITY)
Admission: EM | Admit: 2017-07-01 | Discharge: 2017-07-01 | Disposition: A | Discharge status: Home / Self Care | Payer: Medicare Other | Attending: Family Medicine | Admitting: Family Medicine

## 2017-07-01 ENCOUNTER — Encounter (HOSPITAL_COMMUNITY): Payer: Self-pay | Admitting: *Deleted

## 2017-07-01 DIAGNOSIS — I82890 Acute embolism and thrombosis of other specified veins: Secondary | ICD-10-CM

## 2017-07-01 NOTE — ED Triage Notes (Signed)
Pain   r  Lower       Leg   X   2   Weeks     Got   Worse  Today      No  specefic  Injury         Ambulated  Well

## 2017-07-01 NOTE — ED Provider Notes (Signed)
[place]  CSN: [id number] Arrival date & time: 07/01/17  1537  History   Chief Complaint Chief Complaint  Patient presents with  . Leg Pain    HPI BELLAH ALIA is a 61 y.o. female presenting for right anterior lower leg pain.   She reports 2 weeks of very localized pain to a nickel-sized area of redness on the superior right shin that has remained constant, hurting most when it is touched. Radiates superiorly and inferiorly intermittently. No appreciable pain on ambulation. No leg swelling or prolonged sitting/laying. No personal or family history of blood clots. Cold compresses and mobic have had no effect.   HPI  Past Medical History:  Diagnosis Date  . Anxiety   . Arthritis   . At high risk for tick borne illness    had abx--test her again--she was okey---at [facility]  . Depression   . Fibromyalgia   . Fibromyalgia     Patient Active Problem List   Diagnosis Date Noted  . Osteoporosis 04/21/2017  . Cervical stenosis of spine 04/21/2017  . Vitamin D deficiency 04/21/2017  . Recurrent major depressive disorder, in partial remission ([place]) 04/09/2017  . Chronic low back pain 04/09/2017  . Fibromyalgia     Past Surgical History:  Procedure Laterality Date  . CESAREAN SECTION    . CYST EXCISION Right 05/09/2017   Procedure: EXCISION RIGHT INGUINAL CYST;  Surgeon: Clovis Riley, MD;  Location: [place];  Service: General;  Laterality: Right;  . KNEE ARTHROSCOPY    . KNEE SURGERY    . OVARIAN CYST REMOVAL      OB History    No data available       Home Medications    Prior to Admission medications   Medication Sig Start Date End Date Taking? Authorizing Provider  diclofenac sodium (VOLTAREN) 1 % GEL Apply 2 g topically 4 (four) times daily. 05/02/17   Edrick Kins, DPM  FLUoxetine (PROZAC) 20 MG capsule Take 2 capsules (40 mg total) by mouth daily. 05/01/17   Nche, Charlene Brooke, NP  meloxicam (MOBIC) 7.5 MG  tablet Take 1 tablet (7.5 mg total) by mouth daily. With food 05/01/17   Nche, Charlene Brooke, NP  pregabalin (LYRICA) 50 MG capsule Take 1 capsule (50 mg total) by mouth 2 (two) times daily. 05/01/17   Nche, Charlene Brooke, NP  traMADol (ULTRAM) 50 MG tablet Take 1 tablet (50 mg total) by mouth every 6 (six) hours as needed. 05/09/17 05/09/18  Clovis Riley, MD    Family History Family History  Problem Relation Age of Onset  . Alzheimer's disease Mother   . Heart disease Father   . Alzheimer's disease Maternal Aunt   . Alzheimer's disease Maternal Uncle   . Alzheimer's disease Maternal Grandmother     Social History Social History  Substance Use Topics  . Smoking status: Never Smoker  . Smokeless tobacco: Never Used  . Alcohol use No     Allergies   Penicillins   Review of Systems Review of Systems No fevers, chest pain, dyspnea.   Physical Exam Triage Vital Signs ED Triage Vitals  Enc Vitals Group     BP 07/01/17 1557 114/77     Pulse Rate 07/01/17 1557 (!) 58     Resp 07/01/17 1557 18     Temp 07/01/17 1557 98.4 F (36.9 C)     Temp Source 07/01/17 1557 Oral     SpO2 07/01/17 1557 99 %  Weight --      Height --      Head Circumference --      Peak Flow --      Pain Score 07/01/17 1559 8     Pain Loc --      Pain Edu? --      Excl. in [place]? --    No data found.   Updated Vital Signs BP 114/77   Pulse (!) 58   Temp 98.4 F (36.9 C) (Oral)   Resp 18   SpO2 99%     Physical Exam  Constitutional: She appears well-developed and well-nourished. No distress.  Cardiovascular: Normal rate, regular rhythm and intact distal pulses.   Pulmonary/Chest: Effort normal and breath sounds normal.  Musculoskeletal:  RLE with ~nickel-sized confluence of superficial veins, blanchable in setting of varicosities. No fluctuance or induration. No pitting edema. Hair growth in normal distribution and cap refill brisk throughout. Homan's negative.      UC Treatments /  Results  Labs (all labs ordered are listed, but only abnormal results are displayed) Labs Reviewed - No data to display  EKG  EKG Interpretation None       Radiology No results found.  Procedures Procedures (including critical care time)  Medications Ordered in UC Medications - No data to display   Initial Impression / Assessment and Plan / UC Course  I have reviewed the triage vital signs and the nursing notes.  Pertinent labs & imaging results that were available during my care of the patient were reviewed by me and considered in my medical decision making (see chart for details).  Final Clinical Impressions(s) / UC Diagnoses   Final diagnoses:  Superficial vein thrombosis   History and exam not consistent with cellulitis or DVT. No improvement with antiinflammatory or qorsening with ambulation argues against musculoskeletal component.  - Will treat with warm compresses and follow up with PCP. - Reviewed return precautions, particularly as related to symptoms of DVT.    Patrecia Pour, MD 07/01/17 [phone]

## 2017-07-01 NOTE — Discharge Instructions (Signed)
Your pain is most likely due to a blood clot in a superficial vein on the leg. You can treat this with warm compresses.   Follow up with your primary care provider in the next 1 - 2 weeks for reevaluation. If your leg becomes more swollen or painful seek medical attention sooner.

## 2017-07-02 ENCOUNTER — Encounter: Payer: Self-pay | Admitting: Nurse Practitioner

## 2017-07-02 ENCOUNTER — Ambulatory Visit (INDEPENDENT_AMBULATORY_CARE_PROVIDER_SITE_OTHER): Payer: Medicare Other | Admitting: Nurse Practitioner

## 2017-07-02 VITALS — BP 100/70 | HR 63 | Temp 98.6°F | Ht 63.0 in | Wt 172.0 lb

## 2017-07-02 DIAGNOSIS — R768 Other specified abnormal immunological findings in serum: Secondary | ICD-10-CM

## 2017-07-02 DIAGNOSIS — M545 Low back pain, unspecified: Secondary | ICD-10-CM

## 2017-07-02 DIAGNOSIS — R7689 Other specified abnormal immunological findings in serum: Secondary | ICD-10-CM

## 2017-07-02 DIAGNOSIS — E559 Vitamin D deficiency, unspecified: Secondary | ICD-10-CM

## 2017-07-02 DIAGNOSIS — M810 Age-related osteoporosis without current pathological fracture: Secondary | ICD-10-CM

## 2017-07-02 DIAGNOSIS — M19041 Primary osteoarthritis, right hand: Secondary | ICD-10-CM | POA: Diagnosis not present

## 2017-07-02 DIAGNOSIS — M170 Bilateral primary osteoarthritis of knee: Secondary | ICD-10-CM | POA: Diagnosis not present

## 2017-07-02 DIAGNOSIS — M797 Fibromyalgia: Secondary | ICD-10-CM

## 2017-07-02 DIAGNOSIS — G8929 Other chronic pain: Secondary | ICD-10-CM

## 2017-07-02 DIAGNOSIS — M4802 Spinal stenosis, cervical region: Secondary | ICD-10-CM

## 2017-07-02 DIAGNOSIS — M19042 Primary osteoarthritis, left hand: Secondary | ICD-10-CM

## 2017-07-02 MED ORDER — CALCIUM CARBONATE 600 MG PO TABS: 600.0000 mg | ORAL_TABLET | Freq: Two times a day (BID) | ORAL | 5 refills | Status: DC

## 2017-07-02 MED ORDER — VITAMIN D (CHOLECALCIFEROL) 25 MCG (1000 UT) PO TABS: 1.0000 | ORAL_TABLET | Freq: Two times a day (BID) | ORAL | 5 refills | Status: DC

## 2017-07-02 MED ORDER — CYCLOBENZAPRINE HCL 5 MG PO TABS: 5.0000 mg | ORAL_TABLET | Freq: Three times a day (TID) | ORAL | 0 refills | Status: DC | PRN

## 2017-07-02 MED ORDER — PREGABALIN 50 MG PO CAPS: 50.0000 mg | ORAL_CAPSULE | Freq: Two times a day (BID) | ORAL | 1 refills | Status: DC

## 2017-07-02 NOTE — Progress Notes (Signed)
Subjective:  Patient ID: Betty Holmes, female    DOB: 07-Jun-1956  Age: 61 y.o. MRN: [id number]  CC: Follow-up (follow up urgent care/legs swelling and painful/ lower back and groin pain--has appt witht the surgeon soon)   HPI  Chronic pain: Persistent generalized pain. Worse in legs and lower back. Has not been taking lyrica as prescribed, due to fear of possible side effects (edema, weight gain, dizziness). She continues to do water aerobics and some walking, which provides minimal relief with pain. Pain is worse at night.  OA in hand and knees: Will like referral to ortho for possible joint injections. States she was evaluated by another orthopedist in past and joint injections were recommended. States she never received call for rheumatology appt. Denies any recent injury. No weakness. No joint swelling or redness.  Outpatient Medications Prior to Visit  Medication Sig Dispense Refill  . FLUoxetine (PROZAC) 20 MG capsule Take 2 capsules (40 mg total) by mouth daily. 60 capsule 5  . meloxicam (MOBIC) 7.5 MG tablet Take 1 tablet (7.5 mg total) by mouth daily. With food 30 tablet 5  . traMADol (ULTRAM) 50 MG tablet Take 1 tablet (50 mg total) by mouth every 6 (six) hours as needed. 20 tablet 0  . diclofenac sodium (VOLTAREN) 1 % GEL Apply 2 g topically 4 (four) times daily. 2 Tube 6  . pregabalin (LYRICA) 50 MG capsule Take 1 capsule (50 mg total) by mouth 2 (two) times daily. 60 capsule 3   No facility-administered medications prior to visit.     ROS See HPI  Objective:  BP 100/70   Pulse 63   Temp 98.6 F (37 C)   Ht 5\' 3"  (1.6 m)   Wt 172 lb (78 kg)   SpO2 98%   BMI 30.47 kg/m   BP Readings from Last 3 Encounters:  07/02/17 100/70  07/01/17 114/77  05/09/17 138/74    Wt Readings from Last 3 Encounters:  07/02/17 172 lb (78 kg)  05/09/17 165 lb 8 oz (75.1 kg)  05/01/17 171 lb (77.6 kg)    Physical Exam  Constitutional: She is oriented to person,  place, and time. No distress.  Neck: Normal range of motion. Neck supple.  Cardiovascular: Normal rate.   Pulmonary/Chest: Effort normal.  Musculoskeletal: She exhibits tenderness. She exhibits no deformity.  Minimal LE edema  Neurological: She is alert and oriented to person, place, and time.  Skin: Skin is warm and dry. No rash noted. No erythema.  Vitals reviewed.   Lab Results  Component Value Date   WBC 4.0 04/11/2017   HGB 13.9 04/11/2017   HCT 42.1 04/11/2017   PLT 219 04/11/2017   GLUCOSE 95 03/24/2017   CHOL 175 05/01/2017   TRIG 57.0 05/01/2017   HDL 68.10 05/01/2017   LDLCALC 95 05/01/2017   ALT 15 03/24/2017   AST 24 03/24/2017   NA 138 03/24/2017   K 4.2 03/24/2017   CL 104 03/24/2017   CREATININE 0.62 03/24/2017   BUN 12 03/24/2017   CO2 28 03/24/2017   TSH 1.11 05/01/2017    No results found.  Assessment & Plan:   Betty Holmes was seen today for follow-up.  Diagnoses and all orders for this visit:  Fibromyalgia -     cyclobenzaprine (FLEXERIL) 5 MG tablet; Take 1 tablet (5 mg total) by mouth 3 (three) times daily as needed for muscle spasms. -     pregabalin (LYRICA) 50 MG capsule; Take 1 capsule (50 mg  total) by mouth 2 (two) times daily.  Chronic bilateral low back pain without sciatica -     cyclobenzaprine (FLEXERIL) 5 MG tablet; Take 1 tablet (5 mg total) by mouth 3 (three) times daily as needed for muscle spasms. -     pregabalin (LYRICA) 50 MG capsule; Take 1 capsule (50 mg total) by mouth 2 (two) times daily.  Cervical stenosis of spine -     AMB referral to orthopedics  Positive ANA (antinuclear antibody) -     Ambulatory referral to Rheumatology  Osteoarthritis of both hands, unspecified osteoarthritis type -     AMB referral to orthopedics  Arthritis of both knees -     AMB referral to orthopedics  Osteoporosis without current pathological fracture, unspecified osteoporosis type -     Vitamin D, Cholecalciferol, 1000 units TABS; Take  1 tablet by mouth 2 times daily at 12 noon and 4 pm. -     calcium carbonate (OS-CAL) 600 MG TABS tablet; Take 1 tablet (600 mg total) by mouth 2 (two) times daily with a meal.  Vitamin D deficiency -     Vitamin D, Cholecalciferol, 1000 units TABS; Take 1 tablet by mouth 2 times daily at 12 noon and 4 pm. -     calcium carbonate (OS-CAL) 600 MG TABS tablet; Take 1 tablet (600 mg total) by mouth 2 (two) times daily with a meal.   I have discontinued Betty Holmes's diclofenac sodium, HYDROcodone-acetaminophen, and LYRICA. I am also having her start on cyclobenzaprine, Vitamin D (Cholecalciferol), and calcium carbonate. Additionally, I am having her maintain her FLUoxetine, meloxicam, traMADol, ibuprofen, and pregabalin.  Meds ordered this encounter  Medications  . DISCONTD: FLUoxetine (PROZAC) 40 MG capsule    Sig: Take by mouth daily.    Refill:  3  . DISCONTD: HYDROcodone-acetaminophen (NORCO) 7.5-325 MG tablet    Sig: TAKE 1 TABLET BY MOUTH EVERY 6 HOURS AS NEEDED FOR MODERATE PAIN    Refill:  0  . ibuprofen (ADVIL,MOTRIN) 600 MG tablet    Sig: TAKE 1 TABLET BY MOUTH EVERY 6 HOURS AS NEEDED FOR MODERATE PAIN    Refill:  0  . DISCONTD: LYRICA 25 MG capsule    Sig: Take 25 mg by mouth 2 (two) times daily.    Refill:  0  . cyclobenzaprine (FLEXERIL) 5 MG tablet    Sig: Take 1 tablet (5 mg total) by mouth 3 (three) times daily as needed for muscle spasms.    Dispense:  30 tablet    Refill:  0    Order Specific Question:   Supervising Provider    Answer:   Cassandria Anger [[id number]]  . pregabalin (LYRICA) 50 MG capsule    Sig: Take 1 capsule (50 mg total) by mouth 2 (two) times daily.    Dispense:  60 capsule    Refill:  1    Order Specific Question:   Supervising Provider    Answer:   Cassandria Anger [[id number]]  . Vitamin D, Cholecalciferol, 1000 units TABS    Sig: Take 1 tablet by mouth 2 times daily at 12 noon and 4 pm.    Dispense:  60 tablet    Refill:  5    Order Specific  Question:   Supervising Provider    Answer:   Cassandria Anger [[id number]]  . calcium carbonate (OS-CAL) 600 MG TABS tablet    Sig: Take 1 tablet (600 mg total) by mouth 2 (two) times daily with a  meal.    Dispense:  60 tablet    Refill:  5    Order Specific Question:   Supervising Provider    Answer:   Cassandria Anger [[id number]]    Follow-up: Return in about 3 months (around 10/02/2017) for CPE (fasting for labs).  Wilfred Lacy, NP

## 2017-07-09 ENCOUNTER — Encounter: Payer: Self-pay | Admitting: Physical Therapy

## 2017-07-09 ENCOUNTER — Ambulatory Visit: Payer: Medicare Other | Attending: Nurse Practitioner | Admitting: Physical Therapy

## 2017-07-09 DIAGNOSIS — M545 Low back pain, unspecified: Secondary | ICD-10-CM

## 2017-07-09 DIAGNOSIS — M6281 Muscle weakness (generalized): Secondary | ICD-10-CM | POA: Diagnosis present

## 2017-07-09 DIAGNOSIS — M25551 Pain in right hip: Secondary | ICD-10-CM | POA: Diagnosis present

## 2017-07-09 NOTE — Therapy (Signed)
[place], [place], [address] Phone: [phone]   Fax:  [phone]  Physical Therapy Evaluation  Patient Details  Name: Betty Holmes MRN: [id number] Date of Birth: 10-May-1956 Referring Provider: Clovis Riley, MD  Encounter Date: 07/09/2017      PT End of Session - 07/09/17 1107    Visit Number 1   Number of Visits 7   Date for PT Re-Evaluation 08/08/17   Authorization Type [employer] MCR- KX at each visit   PT Start Time 1107  pt arrived late   PT Stop Time 1143   PT Time Calculation (min) 36 min   Activity Tolerance Patient tolerated treatment well   Behavior During Therapy [facility] for tasks assessed/performed      Past Medical History:  Diagnosis Date  . Anxiety   . Arthritis   . At high risk for tick borne illness    had abx--test her again--she was okey---at [facility]  . Depression   . Fibromyalgia   . Fibromyalgia     Past Surgical History:  Procedure Laterality Date  . CESAREAN SECTION    . CYST EXCISION Right 05/09/2017   Procedure: EXCISION RIGHT INGUINAL CYST;  Surgeon: Clovis Riley, MD;  Location: [facility];  Service: General;  Laterality: Right;  . KNEE ARTHROSCOPY    . KNEE SURGERY    . OVARIAN CYST REMOVAL      There were no vitals filed for this visit.       Subjective Assessment - 07/09/17 1109    Subjective Surgery for cyst (05/10/17) in groin resulting in LBP wrapping around to groin. Started swimming for stretch and exercise, resulted in severe pain when laying down. Pain with hip flexion. Feels that she is throwing her back out, has been trying to brace core.    Patient Stated Goals lift legs to don clothes, swim, clean cat litter, sleep   Currently in Pain? No/denies   Aggravating Factors  marching, dressing   Pain Relieving Factors bracing core            [facility] PT Assessment - 07/09/17 0001      Assessment   Medical  Diagnosis R groin pain   Referring Provider Clovis Riley, MD   Hand Dominance Right   Prior Therapy yes     Precautions   Precautions None     Restrictions   Weight Bearing Restrictions No     Balance Screen   Has the patient fallen in the past 6 months No     [place] residence   Living Arrangements Alone   Type of [place] to enter   Entrance Stairs-Number of Steps 2   Entrance Stairs-Rails None   Home Layout One level     Prior Function   Level of Independence Independent     Cognition   Overall Cognitive Status Within Functional Limits for tasks assessed     Observation/Other Assessments   Focus on Therapeutic Outcomes (FOTO)  FOTO not given     Palpation   Palpation comment concordant pain in quads and adductors, piriformis            Objective measurements completed on examination: See above findings.          [facility] Adult PT Treatment/Exercise - 07/09/17 0001      Exercises   Exercises Knee/Hip     Knee/Hip  Exercises: Stretches   Hip Flexor Stretch Limitations thomas test position& runner stretch   Other Knee/Hip Stretches standing adductor stretch     Knee/Hip Exercises: Standing   Knee Flexion Limitations standing hamstring curls   Abduction Limitations table for UE support     Manual Therapy   Manual Therapy Myofascial release   Myofascial Release manual trigger point release R hip adductor magnus, pectineus, illiopsoas                PT Education - 07/09/17 1308    Education provided Yes   Education Details anatomy of condition, POC, HEP, exercise form/[place], pool exercises   Person(s) Educated Patient   Methods Explanation;Demonstration;Tactile cues;Verbal cues;Handout   Comprehension Verbalized understanding;Returned demonstration;Verbal cues required;Tactile cues required;Need further instruction          PT Short Term Goals - 06/09/17 1029      PT  SHORT TERM GOAL #1   Title pt will be I with inital HEp (06/06/2017)   Time 3   Period Weeks   Status Achieved     PT SHORT TERM GOAL #2   Title pt will increase bil grip strength to >/= 5# to demonstate improving function (06/06/2017)   Time 3   Period Weeks   Status Achieved           PT Long Term Goals - 07/09/17 1311      PT LONG TERM GOAL #1   Title Pt will be able to perform active hip flexion without pain by 8/17   Baseline pain at eval   Time 4   Period Weeks   Status New     PT LONG TERM GOAL #2   Title Pt will be able to squat to floor to grab objects and clean litter box withou back/hip/groin pain   Baseline pain at eval   Time 4   Period Weeks   Status New     PT LONG TERM GOAL #3   Title pt will be able to swim without increase in concordant groin pain for long term exercise   Baseline severe pain at eval   Time 4   Period Weeks   Status New     PT LONG TERM GOAL #4   Title Pt will be able to sleep without limitation by groin pain   Baseline woken by pain at eval   Time 4   Period Weeks   Status New     PT LONG TERM GOAL #5   Title goal from previous episode                Plan - 07/09/17 1143    Clinical Impression Statement Pt presents to PT with complaints of R sided groin pain that wraps around her hip to low back. Has been doing a lot of marching in the pool. Concordant pain found in adductors, pectineus and illiopsoas. Trigger points released manually today. Pt will benefit from skilled PT in order to decrease adductor tightness and improve lumbopelvic control to provide support to low back and hip.    History and Personal Factors relevant to plan of care: cyst removal in R groin, fibromyalgia, anxiety, arthritis   Clinical Presentation Stable   Clinical Presentation due to: n/a   Clinical Decision Making Low   Rehab Potential Good   PT Frequency 2x / week   PT Duration 4 weeks   PT Treatment/Interventions ADLs/Self Care Home  Management;Cryotherapy;Electrical Stimulation;Iontophoresis 4mg /ml Dexamethasone;Functional mobility training;Stair training;Gait training;Ultrasound;Traction;Moist Heat;Therapeutic activities;Therapeutic  exercise;Balance training;Neuromuscular re-education;Patient/family education;Passive range of motion;Manual techniques;Dry needling;Taping   PT Next Visit Plan trigger point release/DN to adductors PRN, glut/core activation, hip abductor activation   PT Home Exercise Plan hip flexor stretch, hip adductor stretch, hip abduction standing, standing hamstring curls.    Consulted and Agree with Plan of Care Patient      Patient will benefit from skilled therapeutic intervention in order to improve the following deficits and impairments:  Increased muscle spasms, Decreased activity tolerance, Pain, Improper body mechanics, Postural dysfunction  Visit Diagnosis: Pain in right hip - Plan: PT plan of care cert/re-cert  Acute right-sided low back pain without sciatica - Plan: PT plan of care cert/re-cert      G-Codes - 30/05/11 1314    Functional Assessment Tool Used (Outpatient Only) Clinical judgement    Functional Limitation Other PT primary   Other PT Primary Current Status (M2111) At least 20 percent but less than 40 percent impaired, limited or restricted   Other PT Primary Goal Status (N3567) At least 1 percent but less than 20 percent impaired, limited or restricted       Problem List Patient Active Problem List   Diagnosis Date Noted  . Osteoporosis 04/21/2017  . Cervical stenosis of spine 04/21/2017  . Vitamin D deficiency 04/21/2017  . Recurrent major depressive disorder, in partial remission ([place]) 04/09/2017  . Chronic low back pain 04/09/2017  . Fibromyalgia     Trini Christiansen C. Debbora Ang PT, DPT 07/09/17 1:16 PM   [facility] [address] [place], [place], [address] Phone: [phone]   Fax:  [phone]  Name: WESTLYNN FIFER MRN: [id number] Date of Birth: January 19, 1956

## 2017-07-15 ENCOUNTER — Ambulatory Visit: Payer: Medicare Other | Admitting: Physical Therapy

## 2017-07-15 DIAGNOSIS — M25551 Pain in right hip: Secondary | ICD-10-CM | POA: Diagnosis not present

## 2017-07-15 DIAGNOSIS — M545 Low back pain, unspecified: Secondary | ICD-10-CM

## 2017-07-15 DIAGNOSIS — M6281 Muscle weakness (generalized): Secondary | ICD-10-CM

## 2017-07-15 NOTE — Therapy (Signed)
[place], [place], [address] Phone: [phone]   Fax:  [phone]  Physical Therapy Treatment  Patient Details  Name: Betty Holmes MRN: [id number] Date of Birth: 1956/11/17 Referring Provider: Clovis Riley, MD  Encounter Date: 07/15/2017      PT End of Session - 07/15/17 1220    Visit Number 2   Number of Visits 7   Date for PT Re-Evaluation 08/08/17   Authorization Type UHC MCR- KX at each visit   PT Start Time 1200  13 minutes late    PT Stop Time 1230   PT Time Calculation (min) 30 min      Past Medical History:  Diagnosis Date  . Anxiety   . Arthritis   . At high risk for tick borne illness    had abx--test her again--she was okey---at [facility]  . Depression   . Fibromyalgia   . Fibromyalgia     Past Surgical History:  Procedure Laterality Date  . CESAREAN SECTION    . CYST EXCISION Right 05/09/2017   Procedure: EXCISION RIGHT INGUINAL CYST;  Surgeon: Clovis Riley, MD;  Location: [facility];  Service: General;  Laterality: Right;  . KNEE ARTHROSCOPY    . KNEE SURGERY    . OVARIAN CYST REMOVAL      There were no vitals filed for this visit.      Subjective Assessment - 07/15/17 1200    Subjective I've been doing research on trigger points.    Currently in Pain? No/denies                         [facility] Adult PT Treatment/Exercise - 07/15/17 0001      Knee/Hip Exercises: Stretches   Hip Flexor Stretch Limitations thomas test stretch    Other Knee/Hip Stretches butterfly stretch      Knee/Hip Exercises: Standing   Knee Flexion Limitations standing hamstring curls   Abduction Limitations table for UE support added yellow band    Extension Limitations table to UE support, added yellow band    Other Standing Knee Exercises lateral wlaking at counter top with yellow band at knees      Knee/Hip Exercises: Supine   Bridges Limitations x 10 with initial pelvic tilt    Bridges with Clamshell 5 reps   Other Supine Knee/Hip Exercises pelvic tilts with glute and core activation x 10                   PT Short Term Goals - 06/09/17 1029      PT SHORT TERM GOAL #1   Title pt will be I with inital HEp (06/06/2017)   Time 3   Period Weeks   Status Achieved     PT SHORT TERM GOAL #2   Title pt will increase bil grip strength to >/= 5# to demonstate improving function (06/06/2017)   Time 3   Period Weeks   Status Achieved           PT Long Term Goals - 07/09/17 1311      PT LONG TERM GOAL #1   Title Pt will be able to perform active hip flexion without pain by 8/17   Baseline pain at eval   Time 4   Period Weeks   Status New     PT LONG TERM GOAL #2   Title Pt will be able to squat to  floor to grab objects and clean litter box withou back/hip/groin pain   Baseline pain at eval   Time 4   Period Weeks   Status New     PT LONG TERM GOAL #3   Title pt will be able to swim without increase in concordant groin pain for long term exercise   Baseline severe pain at eval   Time 4   Period Weeks   Status New     PT LONG TERM GOAL #4   Title Pt will be able to sleep without limitation by groin pain   Baseline woken by pain at eval   Time 4   Period Weeks   Status New     PT LONG TERM GOAL #5   Title goal from previous episode               Plan - 07/15/17 1240    Clinical Impression Statement Pt arrives late for her appointment due to the bus running late. We reviewed her HEP. She is doing leg motions as instructed in the pool. She is riding the lateral elliptical machine at the gym. Pt given yellow band to add resistance to hip motions at home. No increased pain.    PT Next Visit Plan trigger point release/DN to adductors PRN, glut/core activation, hip abductor activation   PT Home Exercise Plan hip flexor stretch, hip adductor stretch, hip abduction standing, standing  hamstring curls.    Consulted and Agree with Plan of Care Patient      Patient will benefit from skilled therapeutic intervention in order to improve the following deficits and impairments:  Increased muscle spasms, Decreased activity tolerance, Pain, Improper body mechanics, Postural dysfunction  Visit Diagnosis: Pain in right hip  Acute right-sided low back pain without sciatica  Muscle weakness (generalized)     Problem List Patient Active Problem List   Diagnosis Date Noted  . Osteoporosis 04/21/2017  . Cervical stenosis of spine 04/21/2017  . Vitamin D deficiency 04/21/2017  . Recurrent major depressive disorder, in partial remission ([place]) 04/09/2017  . Chronic low back pain 04/09/2017  . Fibromyalgia     Dorene Ar, [place] 07/15/2017, 1:36 PM  [facility] [address] [place], [place], [address] Phone: [phone]   Fax:  [phone]  Name: LAYCI STENGLEIN MRN: [id number] Date of Birth: 05/30/1956

## 2017-07-18 MED ORDER — BETAMETHASONE SOD PHOS & ACET 6 (3-3) MG/ML IJ SUSP: 3.0000 mg | Freq: Once | INTRAMUSCULAR | Status: DC

## 2017-07-18 NOTE — Progress Notes (Signed)
   Subjective:  Patient presents today for pain and tenderness to the left ankle. Patient relates significant pain and tenderness when walking.  Patient presents for further treatment and evaluation. Patient recently underwent soft tissue tumor excision of the left foot. Date of surgery 04/14/2017. Patient states that the excision of soft tissue tumor has healed uneventfully and she no longer expresses any pain.  Objective / Physical Exam:  General:  The patient is alert and oriented x3 in no acute distress. Dermatology:  Skin is warm, dry and supple bilateral lower extremities. Negative for open lesions or macerations. Vascular:  Palpable pedal pulses bilaterally. No edema or erythema noted. Capillary refill within normal limits. Neurological:  Epicritic and protective threshold grossly intact bilaterally.  Musculoskeletal Exam:  Pain on palpation to the anterior lateral medial aspects of the patient's left ankle. Mild edema noted.  Range of motion within normal limits to all pedal and ankle joints bilateral. Muscle strength 5/5 in all groups bilateral.   Radiographic Exam:  Normal osseous mineralization. Joint spaces preserved. No fracture/dislocation/boney destruction.    Assessment: #1 pain in left ankle #2 synovitis of left ankle #3 capsulitis of left ankle  Plan of Care:  #1 Patient was evaluated. #2 injection of 0.5 mL Celestone Soluspan injected in the patient's left ankle. #3 recommend the patient continue wearing ankle braces for support when necessary #4 patient states that aqua therapy helps to relieve her ankle pain. The patient may continue with aqua therapy as long as she feels like it aids in alleviating her symptoms #5 return to clinic when necessary   Edrick Kins, DPM [facility]  Dr. Edrick Kins, [place]                                        [place], [place] [address]                Office [phone]  Fax [phone]

## 2017-07-24 ENCOUNTER — Ambulatory Visit (INDEPENDENT_AMBULATORY_CARE_PROVIDER_SITE_OTHER): Payer: Medicare Other

## 2017-07-24 ENCOUNTER — Ambulatory Visit (INDEPENDENT_AMBULATORY_CARE_PROVIDER_SITE_OTHER): Payer: Medicare Other | Admitting: Orthopaedic Surgery

## 2017-07-24 ENCOUNTER — Encounter (INDEPENDENT_AMBULATORY_CARE_PROVIDER_SITE_OTHER): Payer: Self-pay | Admitting: Orthopaedic Surgery

## 2017-07-24 DIAGNOSIS — M25531 Pain in right wrist: Secondary | ICD-10-CM | POA: Insufficient documentation

## 2017-07-24 DIAGNOSIS — M25532 Pain in left wrist: Secondary | ICD-10-CM | POA: Diagnosis not present

## 2017-07-24 NOTE — Progress Notes (Signed)
Office Visit Note   Patient: Betty Holmes           Date of Birth: [birth year]           MRN: [id number] Visit Date: 07/24/2017              Requested by: Flossie Buffy, NP [place], [place] [address] PCP: Flossie Buffy, NP   Assessment & Plan: Visit Diagnoses:  1. Pain in left wrist   2. Painful wrist, right     Plan:  #1: Use of Voltaren gel to both wrists 3 times a day #2: A prescription for [employer] for first [facility] wraps #3: Appointment with Dr. Estanislado Pandy because of abnormal rheumatologic labs #4: If this is not beneficial than we can inject her [facility] joints as needed.  Follow-Up Instructions: No Follow-up on file.   Orders:  Orders Placed This Encounter  Procedures  . XR Wrist Complete Left  . XR Wrist Complete Right   No orders of the defined types were placed in this encounter.     Procedures: No procedures performed   Clinical Data: No additional findings.   Subjective: Chief Complaint  Patient presents with  . Right Knee - Pain    Ms Betty Holmes is a 61 y o that presents with BIL knee pain, swelling constantly in her hands, feet, knees. She does have an appt with a RHeumatologist in October. SHe would like a referral to dr. Keturah Barre.  Fibromyalgia  . Left Knee - Pain    HPI  Betty Holmes is a 61 year old female who presents today with pain in both knees and hands. She states however that her hands are the worse. He would like to have this evaluated today. She apparently has a point with a rheumatologist in October but would like to consider seeing Dr. Estanislado Pandy since this offices closer to her house. She apparently does have history of foot synovitis and ankle synovitis being treated by [facility]. She has had Celestone injections.  She complains  today of her hands and wrist. This is worse in her ankles. She is also concerned of the fact that her little finger at the DIP joint is starting to become radial position. She has also been noted to  have that RA that is elevated on lab workup. Seen today for evaluation of her hands.  Review of Systems  All other systems reviewed and are negative.    Objective: Vital Signs: BP 97/67   Pulse 67   Resp 14   Ht 5\' 3"  (1.6 m)   Wt 167 lb (75.8 kg)   BMI 29.58 kg/m   Physical Exam  Constitutional: She is oriented to person, place, and time. She appears well-developed and well-nourished.  HENT:  Head: Normocephalic and atraumatic.  Eyes: Pupils are equal, round, and reactive to light. EOM are normal.  Pulmonary/Chest: Effort normal.  Neurological: She is alert and oriented to person, place, and time.  Skin: Skin is warm and dry.  Psychiatric: She has a normal mood and affect. Her behavior is normal. Judgment and thought content normal.    Ortho Exam  Today she appears to have some swelling about the first [facility] joint. Bilaterally. Tender at the first [facility] joints bilaterally. She does have positive grind testing. No erythema noted.  Specialty Comments:  No specialty comments available.  Imaging: No results found.   PMFS History: Current Outpatient Prescriptions  Medication Sig Dispense Refill  . calcium carbonate (OS-CAL) 600 MG TABS  tablet Take 1 tablet (600 mg total) by mouth 2 (two) times daily with a meal. 60 tablet 5  . cyclobenzaprine (FLEXERIL) 5 MG tablet Take 1 tablet (5 mg total) by mouth 3 (three) times daily as needed for muscle spasms. 30 tablet 0  . FLUoxetine (PROZAC) 20 MG capsule Take 2 capsules (40 mg total) by mouth daily. 60 capsule 5  . ibuprofen (ADVIL,MOTRIN) 600 MG tablet TAKE 1 TABLET BY MOUTH EVERY 6 HOURS AS NEEDED FOR MODERATE PAIN  0  . meloxicam (MOBIC) 7.5 MG tablet Take 1 tablet (7.5 mg total) by mouth daily. With food 30 tablet 5  . pregabalin (LYRICA) 50 MG capsule Take 1 capsule (50 mg total) by mouth 2 (two) times daily. 60 capsule 1  . Vitamin D, Cholecalciferol, 1000 units TABS Take 1 tablet by mouth 2 times daily at 12 noon and 4 pm. 60  tablet 5   Current Facility-Administered Medications  Medication Dose Route Frequency Provider Last Rate Last Dose  . betamethasone acetate-betamethasone sodium phosphate (CELESTONE) injection 3 mg  3 mg Intramuscular Once Edrick Kins, DPM         Patient Active Problem List   Diagnosis Date Noted  . Pain in left wrist 07/24/2017  . Painful wrist, right 07/24/2017  . Osteoporosis 04/21/2017  . Cervical stenosis of spine 04/21/2017  . Vitamin D deficiency 04/21/2017  . Recurrent major depressive disorder, in partial remission ([place]) 04/09/2017  . Chronic low back pain 04/09/2017  . Fibromyalgia    Past Medical History:  Diagnosis Date  . Anxiety   . Arthritis   . At high risk for tick borne illness    had abx--test her again--she was okey---at [facility]  . Depression   . Fibromyalgia   . Fibromyalgia     Family History  Problem Relation Age of Onset  . Alzheimer's disease Mother   . Heart disease Father   . Alzheimer's disease Maternal Aunt   . Alzheimer's disease Maternal Uncle   . Alzheimer's disease Maternal Grandmother     Past Surgical History:  Procedure Laterality Date  . CESAREAN SECTION    . CYST EXCISION Right 05/09/2017   Procedure: EXCISION RIGHT INGUINAL CYST;  Surgeon: Clovis Riley, MD;  Location: [facility];  Service: General;  Laterality: Right;  . KNEE ARTHROSCOPY    . KNEE SURGERY    . OVARIAN CYST REMOVAL     Social History   Occupational History  . Not on file.   Social History Main Topics  . Smoking status: Never Smoker  . Smokeless tobacco: Never Used  . Alcohol use No  . Drug use: No  . Sexual activity: Not on file

## 2017-07-29 ENCOUNTER — Ambulatory Visit: Payer: Medicare Other | Attending: Nurse Practitioner | Admitting: Physical Therapy

## 2017-07-29 DIAGNOSIS — M25551 Pain in right hip: Secondary | ICD-10-CM | POA: Insufficient documentation

## 2017-07-29 DIAGNOSIS — M545 Low back pain: Secondary | ICD-10-CM | POA: Diagnosis present

## 2017-07-29 DIAGNOSIS — M6281 Muscle weakness (generalized): Secondary | ICD-10-CM | POA: Insufficient documentation

## 2017-07-29 NOTE — Therapy (Signed)
[place], [place], [address] Phone: [phone]   Fax:  [phone]  Physical Therapy Treatment  Patient Details  Name: Betty Holmes MRN: [id number] Date of Birth: 06-26-1956 Referring Provider: Clovis Riley, MD  Encounter Date: 07/29/2017      PT End of Session - 07/29/17 1635    Visit Number 3   Number of Visits 7   Date for PT Re-Evaluation 08/08/17   Authorization Type UHC MCR- KX at each visit   PT Start Time 1634   PT Stop Time 1723   PT Time Calculation (min) 49 min   Activity Tolerance Patient tolerated treatment well   Behavior During Therapy [facility] for tasks assessed/performed      Past Medical History:  Diagnosis Date  . Anxiety   . Arthritis   . At high risk for tick borne illness    had abx--test her again--she was okey---at [facility]  . Depression   . Fibromyalgia   . Fibromyalgia     Past Surgical History:  Procedure Laterality Date  . CESAREAN SECTION    . CYST EXCISION Right 05/09/2017   Procedure: EXCISION RIGHT INGUINAL CYST;  Surgeon: Clovis Riley, MD;  Location: [facility];  Service: General;  Laterality: Right;  . KNEE ARTHROSCOPY    . KNEE SURGERY    . OVARIAN CYST REMOVAL      There were no vitals filed for this visit.      Subjective Assessment - 07/29/17 1635    Subjective A little nagging pain, not 911 type pain in groin. On Sunday began jogging and felt 911 pain. Stretches and ice are helpful.    Currently in Pain? No/denies   Pain Score --  6/10 prior to ibuprofen   Pain Location Groin   Pain Orientation Right   Aggravating Factors  jogging, turning too fast                         [facility] Adult PT Treatment/Exercise - 07/29/17 0001      Knee/Hip Exercises: Stretches   Hip Flexor Stretch Limitations thomas test stretch    Other Knee/Hip Stretches standing adductor stretch     Knee/Hip  Exercises: Aerobic   Stationary Bike 5 min L4     Knee/Hip Exercises: Sidelying   Hip ABduction 10 reps;2 sets   Hip ABduction Limitations abd + ext     Modalities   Modalities Cryotherapy     Cryotherapy   Number Minutes Cryotherapy 10 Minutes  3 min concurrent with education   Cryotherapy Location Hip  R groin   Type of Cryotherapy Ice pack     Manual Therapy   Manual Therapy Soft tissue mobilization   Manual therapy comments skilled monitoring & palpation performed while DN   Soft tissue mobilization hip adductor group          Trigger Point Dry Needling - 07/29/17 1704    Consent Given? Yes   Education Handout Provided --  verbal education   Muscles Treated Lower Body Adductor longus/brevius/maximus   Adductor Response Twitch response elicited;Palpable increased muscle length              PT Education - 07/29/17 1715    Education provided Yes   Education Details TPDN & expected outcomes, proper gym equipment, importance of stretching   Person(s) Educated Patient   Methods Explanation   Comprehension Verbalized understanding;Need further instruction  PT Short Term Goals - 06/09/17 1029      PT SHORT TERM GOAL #1   Title pt will be I with inital HEp (06/06/2017)   Time 3   Period Weeks   Status Achieved     PT SHORT TERM GOAL #2   Title pt will increase bil grip strength to >/= 5# to demonstate improving function (06/06/2017)   Time 3   Period Weeks   Status Achieved           PT Long Term Goals - 07/09/17 1311      PT LONG TERM GOAL #1   Title Pt will be able to perform active hip flexion without pain by 8/17   Baseline pain at eval   Time 4   Period Weeks   Status New     PT LONG TERM GOAL #2   Title Pt will be able to squat to floor to grab objects and clean litter box withou back/hip/groin pain   Baseline pain at eval   Time 4   Period Weeks   Status New     PT LONG TERM GOAL #3   Title pt will be able to swim without  increase in concordant groin pain for long term exercise   Baseline severe pain at eval   Time 4   Period Weeks   Status New     PT LONG TERM GOAL #4   Title Pt will be able to sleep without limitation by groin pain   Baseline woken by pain at eval   Time 4   Period Weeks   Status New     PT LONG TERM GOAL #5   Title goal from previous episode               Plan - 07/29/17 1706    Clinical Impression Statement pain at attachment of adductors on pelvis, significant tightness in adductors and gracilis. Pt verbalized notable decrease in pain following TPDN today. Bike to decrease soreness after manual treatment. Encouraged stretching before and after exercise.    PT Treatment/Interventions ADLs/Self Care Home Management;Cryotherapy;Electrical Stimulation;Iontophoresis 4mg /ml Dexamethasone;Functional mobility training;Stair training;Gait training;Ultrasound;Traction;Moist Heat;Therapeutic activities;Therapeutic exercise;Balance training;Neuromuscular re-education;Patient/family education;Passive range of motion;Manual techniques;Dry needling;Taping   PT Next Visit Plan how did TPDN do? DN gracilis?, core activation   PT Home Exercise Plan hip flexor stretch, hip adductor stretch, hip abduction standing, standing hamstring curls.    Consulted and Agree with Plan of Care Patient      Patient will benefit from skilled therapeutic intervention in order to improve the following deficits and impairments:  Increased muscle spasms, Decreased activity tolerance, Pain, Improper body mechanics, Postural dysfunction  Visit Diagnosis: Pain in right hip     Problem List Patient Active Problem List   Diagnosis Date Noted  . Pain in left wrist 07/24/2017  . Painful wrist, right 07/24/2017  . Osteoporosis 04/21/2017  . Cervical stenosis of spine 04/21/2017  . Vitamin D deficiency 04/21/2017  . Recurrent major depressive disorder, in partial remission ([place]) 04/09/2017  . Chronic low back  pain 04/09/2017  . Fibromyalgia     Noralyn Karim C. Oluwatamilore Starnes PT, DPT 07/29/17 5:21 PM   [facility] [address] [place], [place], [address] Phone: [phone]   Fax:  [phone]  Name: Betty Holmes MRN: [id number] Date of Birth: 1956-01-22

## 2017-07-30 ENCOUNTER — Ambulatory Visit: Payer: Medicare Other | Admitting: Physical Therapy

## 2017-07-30 ENCOUNTER — Encounter: Payer: Self-pay | Admitting: Physical Therapy

## 2017-07-30 DIAGNOSIS — M25551 Pain in right hip: Secondary | ICD-10-CM

## 2017-07-30 NOTE — Therapy (Signed)
[place], [place], [address] Phone: [phone]   Fax:  [phone]  Physical Therapy Treatment  Patient Details  Name: Betty Holmes MRN: [id number] Date of Birth: 04/07/1956 Referring Provider: Clovis Riley, MD  Encounter Date: 07/30/2017      PT End of Session - 07/30/17 0855    Visit Number 4   Number of Visits 7   Date for PT Re-Evaluation 08/08/17   Authorization Type UHC MCR- KX at each visit   PT Start Time [phone]  pt arrived late   PT Stop Time 0926   PT Time Calculation (min) 30 min   Activity Tolerance Patient tolerated treatment well   Behavior During Therapy [facility] for tasks assessed/performed      Past Medical History:  Diagnosis Date  . Anxiety   . Arthritis   . At high risk for tick borne illness    had abx--test her again--she was okey---at [facility]  . Depression   . Fibromyalgia   . Fibromyalgia     Past Surgical History:  Procedure Laterality Date  . CESAREAN SECTION    . CYST EXCISION Right 05/09/2017   Procedure: EXCISION RIGHT INGUINAL CYST;  Surgeon: Clovis Riley, MD;  Location: [facility];  Service: General;  Laterality: Right;  . KNEE ARTHROSCOPY    . KNEE SURGERY    . OVARIAN CYST REMOVAL      There were no vitals filed for this visit.      Subjective Assessment - 07/30/17 0856    Subjective a large sense of relief. denies soreness or bruising. A little twinge but stretched after. No night pain. A mile walk to PT this AM with no pain.    Currently in Pain? No/denies                         [facility] Adult PT Treatment/Exercise - 07/30/17 0001      Therapeutic Activites    Therapeutic Activities ADL's   ADL's getting up from floor     Knee/Hip Exercises: Aerobic   Nustep 5 min L5     Knee/Hip Exercises: Supine   Bridges with Clamshell 10 reps  green tband     Knee/Hip Exercises: Prone   Hip  Extension Both;15 reps   Hip Extension Limitations with iso hamstring curl          Trigger Point Dry Needling - 07/29/17 1704    Consent Given? Yes   Education Handout Provided --  verbal education   Muscles Treated Lower Body Adductor longus/brevius/maximus   Adductor Response Twitch response elicited;Palpable increased muscle length              PT Education - 07/30/17 0859    Education provided Yes   Education Details exercise form/rationale   Person(s) Educated Patient   Methods Explanation;Demonstration;Tactile cues;Verbal cues;Handout   Comprehension Verbalized understanding;Returned demonstration;Verbal cues required;Tactile cues required;Need further instruction          PT Short Term Goals - 06/09/17 1029      PT SHORT TERM GOAL #1   Title pt will be I with inital HEp (06/06/2017)   Time 3   Period Weeks   Status Achieved     PT SHORT TERM GOAL #2   Title pt will increase bil grip strength to >/= 5# to demonstate improving function (06/06/2017)   Time 3   Period Weeks   Status  Achieved           PT Long Term Goals - 07/09/17 1311      PT LONG TERM GOAL #1   Title Pt will be able to perform active hip flexion without pain by 8/17   Baseline pain at eval   Time 4   Period Weeks   Status New     PT LONG TERM GOAL #2   Title Pt will be able to squat to floor to grab objects and clean litter box withou back/hip/groin pain   Baseline pain at eval   Time 4   Period Weeks   Status New     PT LONG TERM GOAL #3   Title pt will be able to swim without increase in concordant groin pain for long term exercise   Baseline severe pain at eval   Time 4   Period Weeks   Status New     PT LONG TERM GOAL #4   Title Pt will be able to sleep without limitation by groin pain   Baseline woken by pain at eval   Time 4   Period Weeks   Status New     PT LONG TERM GOAL #5   Title goal from previous episode               Plan - 07/30/17 6237     Clinical Impression Statement No need for DN today due to lack of pain. Focused on glut activation. Practiced getting up from floor in an appropraite and safe manner. I asked her to practice at home with an safe environment or wait until her next apt and we can practice more.    PT Treatment/Interventions ADLs/Self Care Home Management;Cryotherapy;Electrical Stimulation;Iontophoresis 4mg /ml Dexamethasone;Functional mobility training;Stair training;Gait training;Ultrasound;Traction;Moist Heat;Therapeutic activities;Therapeutic exercise;Balance training;Neuromuscular re-education;Patient/family education;Passive range of motion;Manual techniques;Dry needling;Taping   PT Next Visit Plan core/glut, review getting off of floor. nearing end of POC   PT Home Exercise Plan hip flexor stretch, hip adductor stretch, hip abduction standing, standing hamstring curls. bridge with clam, prone hip ext with HS curl   Consulted and Agree with Plan of Care Patient      Patient will benefit from skilled therapeutic intervention in order to improve the following deficits and impairments:  Increased muscle spasms, Decreased activity tolerance, Pain, Improper body mechanics, Postural dysfunction  Visit Diagnosis: Pain in right hip     Problem List Patient Active Problem List   Diagnosis Date Noted  . Pain in left wrist 07/24/2017  . Painful wrist, right 07/24/2017  . Osteoporosis 04/21/2017  . Cervical stenosis of spine 04/21/2017  . Vitamin D deficiency 04/21/2017  . Recurrent major depressive disorder, in partial remission ([place]) 04/09/2017  . Chronic low back pain 04/09/2017  . Fibromyalgia    Shemicka Cohrs C. Hurshel Bouillon PT, DPT 07/30/17 9:30 AM   [place], [place], [address] Phone: [phone]   Fax:  [phone]  Name: AMELIE HOLLARS MRN: [id number] Date of Birth: 15-Aug-1956

## 2017-08-04 ENCOUNTER — Ambulatory Visit: Payer: Medicare Other | Admitting: Physical Therapy

## 2017-08-04 ENCOUNTER — Ambulatory Visit: Payer: Medicare Other | Admitting: Nurse Practitioner

## 2017-08-04 DIAGNOSIS — M25551 Pain in right hip: Secondary | ICD-10-CM

## 2017-08-04 DIAGNOSIS — M545 Low back pain, unspecified: Secondary | ICD-10-CM

## 2017-08-04 DIAGNOSIS — M6281 Muscle weakness (generalized): Secondary | ICD-10-CM

## 2017-08-04 NOTE — Therapy (Signed)
[place], [place], [address] Phone: [phone]   Fax:  [phone]  Physical Therapy Treatment  Patient Details  Name: Betty Holmes MRN: [id number] Date of Birth: 18-Jul-1956 Referring Provider: Clovis Riley, MD  Encounter Date: 08/04/2017      PT End of Session - 08/04/17 1021    Visit Number 5   Number of Visits 7   Date for PT Re-Evaluation 08/08/17   Authorization Type UHC MCR- KX at each visit   PT Start Time 1015   PT Stop Time 1100   PT Time Calculation (min) 45 min      Past Medical History:  Diagnosis Date  . Anxiety   . Arthritis   . At high risk for tick borne illness    had abx--test her again--she was okey---at [facility]  . Depression   . Fibromyalgia   . Fibromyalgia     Past Surgical History:  Procedure Laterality Date  . CESAREAN SECTION    . CYST EXCISION Right 05/09/2017   Procedure: EXCISION RIGHT INGUINAL CYST;  Surgeon: Clovis Riley, MD;  Location: [facility];  Service: General;  Laterality: Right;  . KNEE ARTHROSCOPY    . KNEE SURGERY    . OVARIAN CYST REMOVAL      There were no vitals filed for this visit.      Subjective Assessment - 08/04/17 1022    Subjective Throbbing pain at night, bilateral groin and this morning. Better after self massage and exercises.    Currently in Pain? No/denies                         [facility] Adult PT Treatment/Exercise - 08/04/17 0001      Knee/Hip Exercises: Stretches   Hip Flexor Stretch Limitations thomas test stretch supine and standing.    Other Knee/Hip Stretches standing adductor stretch     Knee/Hip Exercises: Aerobic   Nustep 5 min L5     Knee/Hip Exercises: Standing   Abduction Limitations 10 x 3 red    Extension Limitations 10 x 3 red      Knee/Hip Exercises: Supine   Bridges with Clamshell 10 reps;3 sets  red   Single Leg Bridge 10 reps;3 sets      Knee/Hip Exercises: Sidelying   Clams 10 x 3 red band      Knee/Hip Exercises: Prone   Hip Extension Both;15 reps   Hip Extension Limitations with iso hamstring curl                  PT Short Term Goals - 06/09/17 1029      PT SHORT TERM GOAL #1   Title pt will be I with inital HEp (06/06/2017)   Time 3   Period Weeks   Status Achieved     PT SHORT TERM GOAL #2   Title pt will increase bil grip strength to >/= 5# to demonstate improving function (06/06/2017)   Time 3   Period Weeks   Status Achieved           PT Long Term Goals - 07/09/17 1311      PT LONG TERM GOAL #1   Title Pt will be able to perform active hip flexion without pain by 8/17   Baseline pain at eval   Time 4   Period Weeks   Status New     PT LONG TERM GOAL #  2   Title Pt will be able to squat to floor to grab objects and clean litter box withou back/hip/groin pain   Baseline pain at eval   Time 4   Period Weeks   Status New     PT LONG TERM GOAL #3   Title pt will be able to swim without increase in concordant groin pain for long term exercise   Baseline severe pain at eval   Time 4   Period Weeks   Status New     PT LONG TERM GOAL #4   Title Pt will be able to sleep without limitation by groin pain   Baseline woken by pain at eval   Time 4   Period Weeks   Status New     PT LONG TERM GOAL #5   Title goal from previous episode               Plan - 08/04/17 1025    Clinical Impression Statement Pt reports proximal groin pain limiting sleep and the walk home after swimming. She has not returned to swimming due to exacerbation of pain. No pain now. Reviewed new HEP as she forgot about the prone exercise.    PT Next Visit Plan core/glut, review getting off of floor. discharge next visit?    PT Home Exercise Plan hip flexor stretch, hip adductor stretch, hip abduction standing, standing hamstring curls. bridge with clam, prone hip ext with HS curl   Consulted and Agree  with Plan of Care Patient      Patient will benefit from skilled therapeutic intervention in order to improve the following deficits and impairments:  Increased muscle spasms, Decreased activity tolerance, Pain, Improper body mechanics, Postural dysfunction  Visit Diagnosis: Pain in right hip  Acute right-sided low back pain without sciatica  Muscle weakness (generalized)     Problem List Patient Active Problem List   Diagnosis Date Noted  . Pain in left wrist 07/24/2017  . Painful wrist, right 07/24/2017  . Osteoporosis 04/21/2017  . Cervical stenosis of spine 04/21/2017  . Vitamin D deficiency 04/21/2017  . Recurrent major depressive disorder, in partial remission ([place]) 04/09/2017  . Chronic low back pain 04/09/2017  . Fibromyalgia     Dorene Ar, [place] 08/04/2017, 2:05 PM  [facility] [address] [place], [place], [address] Phone: [phone]   Fax:  [phone]  Name: Betty Holmes MRN: [id number] Date of Birth: [birth year]

## 2017-08-06 ENCOUNTER — Encounter: Payer: Self-pay | Admitting: Physical Therapy

## 2017-08-06 ENCOUNTER — Ambulatory Visit: Payer: Medicare Other | Admitting: Physical Therapy

## 2017-08-06 DIAGNOSIS — M25551 Pain in right hip: Secondary | ICD-10-CM | POA: Diagnosis not present

## 2017-08-06 NOTE — Therapy (Signed)
[place], [place], [address] Phone: [phone]   Fax:  [phone]  Physical Therapy Treatment/Discharge Summary  Patient Details  Name: Betty Holmes MRN: [id number] Date of Birth: 11/02/1956 Referring Provider: Clovis Riley, MD  Encounter Date: 08/06/2017      PT End of Session - 08/06/17 0854    Visit Number 6   Number of Visits 7   Date for PT Re-Evaluation 08/08/17   Authorization Type [employer] MCR- KX at each visit   PT Start Time [phone]  pt arrived late   PT Stop Time 0922   PT Time Calculation (min) 28 min   Activity Tolerance Patient tolerated treatment well   Behavior During Therapy [facility] for tasks assessed/performed      Past Medical History:  Diagnosis Date  . Anxiety   . Arthritis   . At high risk for tick borne illness    had abx--test her again--she was okey---at [facility]  . Depression   . Fibromyalgia   . Fibromyalgia     Past Surgical History:  Procedure Laterality Date  . CESAREAN SECTION    . CYST EXCISION Right 05/09/2017   Procedure: EXCISION RIGHT INGUINAL CYST;  Surgeon: Clovis Riley, MD;  Location: [facility];  Service: General;  Laterality: Right;  . KNEE ARTHROSCOPY    . KNEE SURGERY    . OVARIAN CYST REMOVAL      There were no vitals filed for this visit.      Subjective Assessment - 08/06/17 0854    Subjective Last night was up all night with pain. Walked to [employer], swam and walked back. Pain was worse since having cyst taken out. Fell on knees while walking 2 days ago. Overall is able to engage core to make walking and exercises easier as well as perform tasks such as cleaning litter box and getting up from floor. Denies any change in "911 pain" when it does hit.    Patient Stated Goals lift legs to don clothes, swim, clean cat litter, sleep   Currently in Pain? No/denies                                  PT Education - 08/06/17 0930    Education provided Yes   Education Details lack of change of "911 pain" with treatment. return to MD. exercise form/rationale   Person(s) Educated Patient   Methods Explanation   Comprehension Verbalized understanding          PT Short Term Goals - 06/09/17 1029      PT SHORT TERM GOAL #1   Title pt will be I with inital HEp (06/06/2017)   Time 3   Period Weeks   Status Achieved     PT SHORT TERM GOAL #2   Title pt will increase bil grip strength to >/= 5# to demonstate improving function (06/06/2017)   Time 3   Period Weeks   Status Achieved           PT Long Term Goals - 08/06/17 9169      PT LONG TERM GOAL #1   Title Pt will be able to perform active hip flexion without pain by 8/17   Baseline no pain today but did with "911 pain" last night   Status Partially Met     PT LONG TERM GOAL #2   Title Pt  will be able to squat to floor to grab objects and clean litter box withou back/hip/groin pain   Baseline able now that she knows how to engage core   Status Achieved     PT LONG TERM GOAL #3   Title pt will be able to swim without increase in concordant groin pain for long term exercise   Baseline sometimes she can but after last swim she had severe pain   Status Partially Met     PT LONG TERM GOAL #4   Title Pt will be able to sleep without limitation by groin pain   Baseline has had a couple of nights without pain but still has nights with severe pain that keeps her awake   Status Partially Met               Plan - August 19, 2017 0909    Clinical Impression Statement Pt denies any change in concordant "911 pain" that continues to be worse at night. She has learned to utilize core contraction which makes activities such as getting off of the floor and cleaning cat box easier. Hip adductor pain has returned after it resolved following TPDN. Concordant pain brought on by  tuning fork contact of bony aspect of R pubic tubercle but unable to create through palpation of surrounding musculature and ligamentous structures. At this time, due to lack of change to concordant pain, I feel it is appropriate to hold PT and return to MD for further testing. I advised pt to continue her exercise program but maybe have alternating days of harder and easier exercises to avoid irritating pain. I encouraged pt to contact MD to schedule an appointment for follow up and to contact me with any further questions.    PT Treatment/Interventions ADLs/Self Care Home Management;Cryotherapy;Electrical Stimulation;Iontophoresis 79m/ml Dexamethasone;Functional mobility training;Stair training;Gait training;Ultrasound;Traction;Moist Heat;Therapeutic activities;Therapeutic exercise;Balance training;Neuromuscular re-education;Patient/family education;Passive range of motion;Manual techniques;Dry needling;Taping   PT Home Exercise Plan hip flexor stretch, hip adductor stretch, hip abduction standing, standing hamstring curls. bridge with clam, prone hip ext with HS curl   Consulted and Agree with Plan of Care Patient      Patient will benefit from skilled therapeutic intervention in order to improve the following deficits and impairments:  Increased muscle spasms, Decreased activity tolerance, Pain, Improper body mechanics, Postural dysfunction  Visit Diagnosis: Pain in right hip       G-Codes - [id number]   Functional Assessment Tool Used (Outpatient Only) Clinical judgement    Functional Limitation Other PT primary   Other PT Primary Goal Status ((K9983 At least 1 percent but less than 20 percent impaired, limited or restricted   Other PT Primary Discharge Status (([id number] At least 1 percent but less than 20 percent impaired, limited or restricted      Problem List Patient Active Problem List   Diagnosis Date Noted  . Pain in left wrist 07/24/2017  . Painful wrist, right 07/24/2017  .  Osteoporosis 04/21/2017  . Cervical stenosis of spine 04/21/2017  . Vitamin D deficiency 04/21/2017  . Recurrent major depressive disorder, in partial remission [place] 04/09/2017  . Chronic low back pain 04/09/2017  . Fibromyalgia     PHYSICAL THERAPY DISCHARGE SUMMARY  Visits from Start of Care: 6  Current functional level related to goals / functional outcomes: See above   Remaining deficits: See above   Education / Equipment: Anatomy of condition, POC, HEP, exercise form/rationale  Plan: Patient agrees to discharge.  Patient goals were not met. Patient  is being discharged due to meeting the stated rehab goals.  ?????     Nilay Mangrum C. Jamerion Cabello PT, DPT 08/06/17 12:30 PM   [facility] [address] [place], [place], [address] Phone: [phone]   Fax:  [phone]  Name: KAAREN NASS MRN: [id number] Date of Birth: Sep 15, 1956

## 2017-08-11 ENCOUNTER — Encounter: Payer: Medicare Other | Admitting: Physical Therapy

## 2017-08-13 ENCOUNTER — Ambulatory Visit: Payer: Medicare Other | Admitting: Physical Therapy

## 2017-10-09 ENCOUNTER — Ambulatory Visit: Payer: Medicare Other | Admitting: Rheumatology

## 2017-11-11 ENCOUNTER — Ambulatory Visit (INDEPENDENT_AMBULATORY_CARE_PROVIDER_SITE_OTHER): Payer: Medicare Other | Admitting: Nurse Practitioner

## 2017-11-11 ENCOUNTER — Telehealth: Payer: Self-pay | Admitting: *Deleted

## 2017-11-11 ENCOUNTER — Encounter: Payer: Self-pay | Admitting: Nurse Practitioner

## 2017-11-11 ENCOUNTER — Other Ambulatory Visit (HOSPITAL_COMMUNITY)
Admission: RE | Admit: 2017-11-11 | Discharge: 2017-11-11 | Disposition: A | Discharge status: Home / Self Care | Payer: Medicare Other | Source: Ambulatory Visit | Attending: Nurse Practitioner | Admitting: Nurse Practitioner

## 2017-11-11 VITALS — BP 110/86 | HR 68 | Temp 97.6°F | Ht 63.0 in | Wt 181.0 lb

## 2017-11-11 DIAGNOSIS — F3341 Major depressive disorder, recurrent, in partial remission: Secondary | ICD-10-CM

## 2017-11-11 DIAGNOSIS — Z124 Encounter for screening for malignant neoplasm of cervix: Secondary | ICD-10-CM | POA: Insufficient documentation

## 2017-11-11 DIAGNOSIS — Z1231 Encounter for screening mammogram for malignant neoplasm of breast: Secondary | ICD-10-CM | POA: Diagnosis not present

## 2017-11-11 DIAGNOSIS — M797 Fibromyalgia: Secondary | ICD-10-CM

## 2017-11-11 DIAGNOSIS — M810 Age-related osteoporosis without current pathological fracture: Secondary | ICD-10-CM

## 2017-11-11 DIAGNOSIS — M545 Low back pain, unspecified: Secondary | ICD-10-CM

## 2017-11-11 DIAGNOSIS — E559 Vitamin D deficiency, unspecified: Secondary | ICD-10-CM

## 2017-11-11 DIAGNOSIS — Z0001 Encounter for general adult medical examination with abnormal findings: Secondary | ICD-10-CM | POA: Diagnosis not present

## 2017-11-11 DIAGNOSIS — G8929 Other chronic pain: Secondary | ICD-10-CM | POA: Diagnosis not present

## 2017-11-11 DIAGNOSIS — Z1211 Encounter for screening for malignant neoplasm of colon: Secondary | ICD-10-CM | POA: Diagnosis not present

## 2017-11-11 DIAGNOSIS — M25552 Pain in left hip: Secondary | ICD-10-CM | POA: Diagnosis not present

## 2017-11-11 DIAGNOSIS — M25551 Pain in right hip: Secondary | ICD-10-CM | POA: Diagnosis not present

## 2017-11-11 MED ORDER — FLUOXETINE HCL 40 MG PO CAPS: 40.0000 mg | ORAL_CAPSULE | Freq: Every day | ORAL | 1 refills | Status: DC

## 2017-11-11 MED ORDER — PREGABALIN 75 MG PO CAPS: 75.0000 mg | ORAL_CAPSULE | Freq: Two times a day (BID) | ORAL | 5 refills | Status: DC

## 2017-11-11 MED ORDER — CALCIUM CARBONATE 600 MG PO TABS: 600.0000 mg | ORAL_TABLET | Freq: Two times a day (BID) | ORAL | 1 refills | Status: DC

## 2017-11-11 MED ORDER — MELOXICAM 7.5 MG PO TABS: 7.5000 mg | ORAL_TABLET | Freq: Every day | ORAL | 1 refills | Status: DC

## 2017-11-11 MED ORDER — CYCLOBENZAPRINE HCL 5 MG PO TABS: 5.0000 mg | ORAL_TABLET | Freq: Two times a day (BID) | ORAL | 1 refills | Status: DC | PRN

## 2017-11-11 MED ORDER — VITAMIN D (CHOLECALCIFEROL) 25 MCG (1000 UT) PO TABS: 1.0000 | ORAL_TABLET | Freq: Two times a day (BID) | ORAL | 1 refills | Status: DC

## 2017-11-11 NOTE — Patient Instructions (Addendum)
You will be contacted to schedule the following: appt with psychology, appt for mammogram.  Schedule dexa scan at front desk to be done at [facility] office.  Please take prozac as prescribed.  Do hip and thigh exercise as directed by physical therapist.  You may also use tylenol prn for pain.  Health Maintenance, Female Adopting a healthy lifestyle and getting preventive care can go a long way to promote health and wellness. Talk with your health care provider about what schedule of regular examinations is right for you. This is a good chance for you to check in with your provider about disease prevention and staying healthy. In between checkups, there are plenty of things you can do on your own. Experts have done a lot of research about which lifestyle changes and preventive measures are most likely to keep you healthy. Ask your health care provider for more information. Weight and diet Eat a healthy diet  Be sure to include plenty of vegetables, fruits, low-fat dairy products, and lean protein.  Do not eat a lot of foods high in solid fats, added sugars, or salt.  Get regular exercise. This is one of the most important things you can do for your health. ? Most adults should exercise for at least 150 minutes each week. The exercise should increase your heart rate and make you sweat (moderate-intensity exercise). ? Most adults should also do strengthening exercises at least twice a week. This is in addition to the moderate-intensity exercise.  Maintain a healthy weight  Body mass index (BMI) is a measurement that can be used to identify possible weight problems. It estimates body fat based on height and weight. Your health care provider can help determine your BMI and help you achieve or maintain a healthy weight.  For females 48 years of age and older: ? A BMI below 18.5 is considered underweight. ? A BMI of 18.5 to 24.9 is normal. ? A BMI of 25 to 29.9 is considered overweight. ? A BMI  of 30 and above is considered obese.  Watch levels of cholesterol and blood lipids  You should start having your blood tested for lipids and cholesterol at 63 years of age, then have this test every 5 years.  You may need to have your cholesterol levels checked more often if: ? Your lipid or cholesterol levels are high. ? You are older than 63 years of age. ? You are at high risk for heart disease.  Cancer screening Lung Cancer  Lung cancer screening is recommended for adults 37-69 years old who are at high risk for lung cancer because of a history of smoking.  A yearly low-dose CT scan of the lungs is recommended for people who: ? Currently smoke. ? Have quit within the past 15 years. ? Have at least a 30-pack-year history of smoking. A pack year is smoking an average of one pack of cigarettes a day for 1 year.  Yearly screening should continue until it has been 15 years since you quit.  Yearly screening should stop if you develop a health problem that would prevent you from having lung cancer treatment.  Breast Cancer  Practice breast self-awareness. This means understanding how your breasts normally appear and feel.  It also means doing regular breast self-exams. Let your health care provider know about any changes, no matter how small.  If you are in your 20s or 30s, you should have a clinical breast exam (CBE) by a health care provider every 1-3 years  as part of a regular health exam.  If you are 54 or older, have a CBE every year. Also consider having a breast X-ray (mammogram) every year.  If you have a family history of breast cancer, talk to your health care provider about genetic screening.  If you are at high risk for breast cancer, talk to your health care provider about having an MRI and a mammogram every year.  Breast cancer gene (BRCA) assessment is recommended for women who have family members with BRCA-related cancers. BRCA-related cancers  include: ? Breast. ? Ovarian. ? Tubal. ? Peritoneal cancers.  Results of the assessment will determine the need for genetic counseling and BRCA1 and BRCA2 testing.  Cervical Cancer Your health care provider may recommend that you be screened regularly for cancer of the pelvic organs (ovaries, uterus, and vagina). This screening involves a pelvic examination, including checking for microscopic changes to the surface of your cervix (Pap test). You may be encouraged to have this screening done every 3 years, beginning at age 69.  For women ages 40-65, health care providers may recommend pelvic exams and Pap testing every 3 years, or they may recommend the Pap and pelvic exam, combined with testing for human papilloma virus (HPV), every 5 years. Some types of HPV increase your risk of cervical cancer. Testing for HPV may also be done on women of any age with unclear Pap test results.  Other health care providers may not recommend any screening for nonpregnant women who are considered low risk for pelvic cancer and who do not have symptoms. Ask your health care provider if a screening pelvic exam is right for you.  If you have had past treatment for cervical cancer or a condition that could lead to cancer, you need Pap tests and screening for cancer for at least 20 years after your treatment. If Pap tests have been discontinued, your risk factors (such as having a new sexual partner) need to be reassessed to determine if screening should resume. Some women have medical problems that increase the chance of getting cervical cancer. In these cases, your health care provider may recommend more frequent screening and Pap tests.  Colorectal Cancer  This type of cancer can be detected and often prevented.  Routine colorectal cancer screening usually begins at 63 years of age and continues through 63 years of age.  Your health care provider may recommend screening at an earlier age if you have risk factors  for colon cancer.  Your health care provider may also recommend using home test kits to check for hidden blood in the stool.  A small camera at the end of a tube can be used to examine your colon directly (sigmoidoscopy or colonoscopy). This is done to check for the earliest forms of colorectal cancer.  Routine screening usually begins at age 60.  Direct examination of the colon should be repeated every 5-10 years through 63 years of age. However, you may need to be screened more often if early forms of precancerous polyps or small growths are found.  Skin Cancer  Check your skin from head to toe regularly.  Tell your health care provider about any new moles or changes in moles, especially if there is a change in a mole's shape or color.  Also tell your health care provider if you have a mole that is larger than the size of a pencil eraser.  Always use sunscreen. Apply sunscreen liberally and repeatedly throughout the day.  Protect yourself by  wearing long sleeves, pants, a wide-brimmed hat, and sunglasses whenever you are outside.  Heart disease, diabetes, and high blood pressure  High blood pressure causes heart disease and increases the risk of stroke. High blood pressure is more likely to develop in: ? People who have blood pressure in the high end of the normal range (130-139/85-89 mm Hg). ? People who are overweight or obese. ? People who are African American.  If you are 60-66 years of age, have your blood pressure checked every 3-5 years. If you are 60 years of age or older, have your blood pressure checked every year. You should have your blood pressure measured twice-once when you are at a hospital or clinic, and once when you are not at a hospital or clinic. Record the average of the two measurements. To check your blood pressure when you are not at a hospital or clinic, you can use: ? An automated blood pressure machine at a pharmacy. ? A home blood pressure monitor.  If  you are between 39 years and 65 years old, ask your health care provider if you should take aspirin to prevent strokes.  Have regular diabetes screenings. This involves taking a blood sample to check your fasting blood sugar level. ? If you are at a normal weight and have a low risk for diabetes, have this test once every three years after 63 years of age. ? If you are overweight and have a high risk for diabetes, consider being tested at a younger age or more often. Preventing infection Hepatitis B  If you have a higher risk for hepatitis B, you should be screened for this virus. You are considered at high risk for hepatitis B if: ? You were born in a country where hepatitis B is common. Ask your health care provider which countries are considered high risk. ? Your parents were born in a high-risk country, and you have not been immunized against hepatitis B (hepatitis B vaccine). ? You have HIV or AIDS. ? You use needles to inject street drugs. ? You live with someone who has hepatitis B. ? You have had sex with someone who has hepatitis B. ? You get hemodialysis treatment. ? You take certain medicines for conditions, including cancer, organ transplantation, and autoimmune conditions.  Hepatitis C  Blood testing is recommended for: ? Everyone born from [birth year] through 1967. ? Anyone with known risk factors for hepatitis C.  Sexually transmitted infections (STIs)  You should be screened for sexually transmitted infections (STIs) including gonorrhea and chlamydia if: ? You are sexually active and are younger than 63 years of age. ? You are older than 63 years of age and your health care provider tells you that you are at risk for this type of infection. ? Your sexual activity has changed since you were last screened and you are at an increased risk for chlamydia or gonorrhea. Ask your health care provider if you are at risk.  If you do not have HIV, but are at risk, it may be recommended  that you take a prescription medicine daily to prevent HIV infection. This is called pre-exposure prophylaxis (PrEP). You are considered at risk if: ? You are sexually active and do not regularly use condoms or know the HIV status of your partner(s). ? You take drugs by injection. ? You are sexually active with a partner who has HIV.  Talk with your health care provider about whether you are at high risk of being infected with  HIV. If you choose to begin PrEP, you should first be tested for HIV. You should then be tested every 3 months for as long as you are taking PrEP. Pregnancy  If you are premenopausal and you may become pregnant, ask your health care provider about preconception counseling.  If you may become pregnant, take 400 to 800 micrograms (mcg) of folic acid every day.  If you want to prevent pregnancy, talk to your health care provider about birth control (contraception). Osteoporosis and menopause  Osteoporosis is a disease in which the bones lose minerals and strength with aging. This can result in serious bone fractures. Your risk for osteoporosis can be identified using a bone density scan.  If you are 21 years of age or older, or if you are at risk for osteoporosis and fractures, ask your health care provider if you should be screened.  Ask your health care provider whether you should take a calcium or vitamin D supplement to lower your risk for osteoporosis.  Menopause may have certain physical symptoms and risks.  Hormone replacement therapy may reduce some of these symptoms and risks. Talk to your health care provider about whether hormone replacement therapy is right for you. Follow these instructions at home:  Schedule regular health, dental, and eye exams.  Stay current with your immunizations.  Do not use any tobacco products including cigarettes, chewing tobacco, or electronic cigarettes.  If you are pregnant, do not drink alcohol.  If you are  breastfeeding, limit how much and how often you drink alcohol.  Limit alcohol intake to no more than 1 drink per day for nonpregnant women. One drink equals 12 ounces of beer, 5 ounces of wine, or 1 ounces of hard liquor.  Do not use street drugs.  Do not share needles.  Ask your health care provider for help if you need support or information about quitting drugs.  Tell your health care provider if you often feel depressed.  Tell your health care provider if you have ever been abused or do not feel safe at home. This information is not intended to replace advice given to you by your health care provider. Make sure you discuss any questions you have with your health care provider. Document Released: 06/24/2011 Document Revised: 05/16/2016 Document Reviewed: 09/12/2015 Elsevier Interactive Patient Education  [employer].

## 2017-11-11 NOTE — Telephone Encounter (Signed)
Rec'd fax pt requesting refill on the Meloxicam 7.5 mg take 1 tablet by mouth daily # 30. Last filled 08/12/17. Forwarding request to [employer] [facility]...Johny Chess

## 2017-11-11 NOTE — Telephone Encounter (Signed)
rx sent to pharmacy

## 2017-11-11 NOTE — Assessment & Plan Note (Signed)
Encouraged patient to use prozac daily. Referral to psychology entered. Consider changing medication to viibryd or adding seroquel ?
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
## 2017-11-14 LAB — CYTOLOGY - PAP
Diagnosis: NEGATIVE
HPV: NOT DETECTED

## 2017-11-20 ENCOUNTER — Ambulatory Visit: Payer: Medicare Other | Admitting: Rheumatology

## 2017-11-21 ENCOUNTER — Other Ambulatory Visit: Payer: Self-pay | Admitting: Nurse Practitioner

## 2017-11-21 DIAGNOSIS — Z1231 Encounter for screening mammogram for malignant neoplasm of breast: Secondary | ICD-10-CM

## 2017-11-28 ENCOUNTER — Inpatient Hospital Stay: Admission: RE | Admit: 2017-11-28 | Payer: Self-pay | Source: Ambulatory Visit

## 2017-12-03 ENCOUNTER — Telehealth: Payer: Self-pay | Admitting: *Deleted

## 2017-12-03 NOTE — Telephone Encounter (Signed)
Error. Pt has refills. And last sent in was 11/11/2017 for 6 mo supply.

## 2017-12-03 NOTE — Telephone Encounter (Signed)
Rec'd fax pt requesting refill on her Meloxicam 7.5 mg take 1 by mouth daily. Last filled 08/12/17. Forwarding to [facility] LPN Laurey Arrow) @ [facility] for approval.../lmb

## 2017-12-19 ENCOUNTER — Ambulatory Visit (INDEPENDENT_AMBULATORY_CARE_PROVIDER_SITE_OTHER)
Admission: RE | Admit: 2017-12-19 | Discharge: 2017-12-19 | Disposition: A | Discharge status: Home / Self Care | Payer: Medicare Other | Source: Ambulatory Visit

## 2017-12-19 DIAGNOSIS — M810 Age-related osteoporosis without current pathological fracture: Secondary | ICD-10-CM

## 2017-12-23 ENCOUNTER — Encounter: Payer: Self-pay | Admitting: Nurse Practitioner

## 2017-12-24 ENCOUNTER — Ambulatory Visit: Payer: Self-pay

## 2018-01-07 ENCOUNTER — Ambulatory Visit (INDEPENDENT_AMBULATORY_CARE_PROVIDER_SITE_OTHER): Payer: Medicare Other | Admitting: Psychology

## 2018-01-07 DIAGNOSIS — F3181 Bipolar II disorder: Secondary | ICD-10-CM | POA: Diagnosis not present

## 2018-01-12 ENCOUNTER — Telehealth: Payer: Self-pay | Admitting: Nurse Practitioner

## 2018-01-12 NOTE — Telephone Encounter (Signed)
Spoke with Betty Holmes regarding AWV. Patient stated that she would like to give office a call back in Nov 19 when she schedules her annual physical. SF

## 2018-01-28 ENCOUNTER — Ambulatory Visit (INDEPENDENT_AMBULATORY_CARE_PROVIDER_SITE_OTHER): Payer: Medicare Other | Admitting: Psychology

## 2018-01-28 DIAGNOSIS — F3132 Bipolar disorder, current episode depressed, moderate: Secondary | ICD-10-CM
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
## 2018-02-06 ENCOUNTER — Ambulatory Visit: Payer: Medicare Other | Admitting: Rheumatology

## 2018-02-18 ENCOUNTER — Ambulatory Visit (INDEPENDENT_AMBULATORY_CARE_PROVIDER_SITE_OTHER): Payer: Self-pay

## 2018-02-18 ENCOUNTER — Ambulatory Visit (INDEPENDENT_AMBULATORY_CARE_PROVIDER_SITE_OTHER): Payer: Medicare Other | Admitting: Rheumatology

## 2018-02-18 ENCOUNTER — Encounter: Payer: Self-pay | Admitting: Rheumatology

## 2018-02-18 VITALS — BP 119/82 | HR 61 | Resp 15 | Ht 63.0 in | Wt 170.5 lb

## 2018-02-18 DIAGNOSIS — Z8639 Personal history of other endocrine, nutritional and metabolic disease: Secondary | ICD-10-CM

## 2018-02-18 DIAGNOSIS — M79642 Pain in left hand: Secondary | ICD-10-CM | POA: Diagnosis not present

## 2018-02-18 DIAGNOSIS — M79641 Pain in right hand: Secondary | ICD-10-CM | POA: Diagnosis not present

## 2018-02-18 DIAGNOSIS — Z8659 Personal history of other mental and behavioral disorders: Secondary | ICD-10-CM | POA: Diagnosis not present

## 2018-02-18 DIAGNOSIS — M170 Bilateral primary osteoarthritis of knee: Secondary | ICD-10-CM | POA: Diagnosis not present

## 2018-02-18 DIAGNOSIS — R768 Other specified abnormal immunological findings in serum: Secondary | ICD-10-CM

## 2018-02-18 DIAGNOSIS — M818 Other osteoporosis without current pathological fracture: Secondary | ICD-10-CM | POA: Diagnosis not present

## 2018-02-18 DIAGNOSIS — M19042 Primary osteoarthritis, left hand: Secondary | ICD-10-CM

## 2018-02-18 DIAGNOSIS — M797 Fibromyalgia: Secondary | ICD-10-CM

## 2018-02-18 DIAGNOSIS — M4802 Spinal stenosis, cervical region: Secondary | ICD-10-CM | POA: Diagnosis not present

## 2018-02-18 DIAGNOSIS — M19041 Primary osteoarthritis, right hand: Secondary | ICD-10-CM | POA: Diagnosis not present

## 2018-02-18 NOTE — Patient Instructions (Addendum)
Knee Exercises Ask your health care provider which exercises are safe for you. Do exercises exactly as told by your health care provider and adjust them as directed. It is normal to feel mild stretching, pulling, tightness, or discomfort as you do these exercises, but you should stop right away if you feel sudden pain or your pain gets worse.Do not begin these exercises until told by your health care provider. STRETCHING AND RANGE OF MOTION EXERCISES These exercises warm up your muscles and joints and improve the movement and flexibility of your knee. These exercises also help to relieve pain, numbness, and tingling. Exercise A: Knee Extension, Prone 1. Lie on your abdomen on a bed. 2. Place your left / right knee just beyond the edge of the surface so your knee is not on the bed. You can put a towel under your left / right thigh just above your knee for comfort. 3. Relax your leg muscles and allow gravity to straighten your knee. You should feel a stretch behind your left / right knee. 4. Hold this position for __________ seconds. 5. Scoot up so your knee is supported between repetitions. Repeat __________ times. Complete this stretch __________ times a day. Exercise B: Knee Flexion, Active  1. Lie on your back with both knees straight. If this causes back discomfort, bend your left / right knee so your foot is flat on the floor. 2. Slowly slide your left / right heel back toward your buttocks until you feel a gentle stretch in the front of your knee or thigh. 3. Hold this position for __________ seconds. 4. Slowly slide your left / right heel back to the starting position. Repeat __________ times. Complete this exercise __________ times a day. Exercise C: Quadriceps, Prone  1. Lie on your abdomen on a firm surface, such as a bed or padded floor. 2. Bend your left / right knee and hold your ankle. If you cannot reach your ankle or pant leg, loop a belt around your foot and grab the belt  instead. 3. Gently pull your heel toward your buttocks. Your knee should not slide out to the side. You should feel a stretch in the front of your thigh and knee. 4. Hold this position for __________ seconds. Repeat __________ times. Complete this stretch __________ times a day. Exercise D: Hamstring, Supine 1. Lie on your back. 2. Loop a belt or towel over the ball of your left / right foot. The ball of your foot is on the walking surface, right under your toes. 3. Straighten your left / right knee and slowly pull on the belt to raise your leg until you feel a gentle stretch behind your knee. ? Do not let your left / right knee bend while you do this. ? Keep your other leg flat on the floor. 4. Hold this position for __________ seconds. Repeat __________ times. Complete this stretch __________ times a day. STRENGTHENING EXERCISES These exercises build strength and endurance in your knee. Endurance is the ability to use your muscles for a long time, even after they get tired. Exercise E: Quadriceps, Isometric  1. Lie on your back with your left / right leg extended and your other knee bent. Put a rolled towel or small pillow under your knee if told by your health care provider. 2. Slowly tense the muscles in the front of your left / right thigh. You should see your kneecap slide up toward your hip or see increased dimpling just above the knee. This   motion will push the back of the knee toward the floor. 3. For __________ seconds, keep the muscle as tight as you can without increasing your pain. 4. Relax the muscles slowly and completely. Repeat __________ times. Complete this exercise __________ times a day. Exercise F: Straight Leg Raises - Quadriceps 1. Lie on your back with your left / right leg extended and your other knee bent. 2. Tense the muscles in the front of your left / right thigh. You should see your kneecap slide up or see increased dimpling just above the knee. Your thigh may  even shake a bit. 3. Keep these muscles tight as you raise your leg 4-6 inches (10-15 cm) off the floor. Do not let your knee bend. 4. Hold this position for __________ seconds. 5. Keep these muscles tense as you lower your leg. 6. Relax your muscles slowly and completely after each repetition. Repeat __________ times. Complete this exercise __________ times a day. Exercise G: Hamstring, Isometric 1. Lie on your back on a firm surface. 2. Bend your left / right knee approximately __________ degrees. 3. Dig your left / right heel into the surface as if you are trying to pull it toward your buttocks. Tighten the muscles in the back of your thighs to dig as hard as you can without increasing any pain. 4. Hold this position for __________ seconds. 5. Release the tension gradually and allow your muscles to relax completely for __________ seconds after each repetition. Repeat __________ times. Complete this exercise __________ times a day. Exercise H: Hamstring Curls  If told by your health care provider, do this exercise while wearing ankle weights. Begin with __________ weights. Then increase the weight by 1 lb (0.5 kg) increments. Do not wear ankle weights that are more than __________. 1. Lie on your abdomen with your legs straight. 2. Bend your left / right knee as far as you can without feeling pain. Keep your hips flat against the floor. 3. Hold this position for __________ seconds. 4. Slowly lower your leg to the starting position.  Repeat __________ times. Complete this exercise __________ times a day. Exercise I: Squats (Quadriceps) 1. Stand in front of a table, with your feet and knees pointing straight ahead. You may rest your hands on the table for balance but not for support. 2. Slowly bend your knees and lower your hips like you are going to sit in a chair. ? Keep your weight over your heels, not over your toes. ? Keep your lower legs upright so they are parallel with the table  legs. ? Do not let your hips go lower than your knees. ? Do not bend lower than told by your health care provider. ? If your knee pain increases, do not bend as low. 3. Hold the squat position for __________ seconds. 4. Slowly push with your legs to return to standing. Do not use your hands to pull yourself to standing. Repeat __________ times. Complete this exercise __________ times a day. Exercise J: Wall Slides (Quadriceps)  1. Lean your back against a smooth wall or door while you walk your feet out 18-24 inches (46-61 cm) from it. 2. Place your feet hip-width apart. 3. Slowly slide down the wall or door until your knees bend __________ degrees. Keep your knees over your heels, not over your toes. Keep your knees in line with your hips. 4. Hold for __________ seconds. Repeat __________ times. Complete this exercise __________ times a day. Exercise K: Straight Leg Raises -   Hip Abductors 1. Lie on your side with your left / right leg in the top position. Lie so your head, shoulder, knee, and hip line up. You may bend your bottom knee to help you keep your balance. 2. Roll your hips slightly forward so your hips are stacked directly over each other and your left / right knee is facing forward. 3. Leading with your heel, lift your top leg 4-6 inches (10-15 cm). You should feel the muscles in your outer hip lifting. ? Do not let your foot drift forward. ? Do not let your knee roll toward the ceiling. 4. Hold this position for __________ seconds. 5. Slowly return your leg to the starting position. 6. Let your muscles relax completely after each repetition. Repeat __________ times. Complete this exercise __________ times a day. Exercise L: Straight Leg Raises - Hip Extensors 1. Lie on your abdomen on a firm surface. You can put a pillow under your hips if that is more comfortable. 2. Tense the muscles in your buttocks and lift your left / right leg about 4-6 inches (10-15 cm). Keep your knee  straight as you lift your leg. 3. Hold this position for __________ seconds. 4. Slowly lower your leg to the starting position. 5. Let your leg relax completely after each repetition. Repeat __________ times. Complete this exercise __________ times a day. This information is not intended to replace advice given to you by your health care provider. Make sure you discuss any questions you have with your health care provider. Document Released: 10/23/2005 Document Revised: 09/02/2016 Document Reviewed: 10/15/2015 Elsevier Interactive Patient Education  2018 [employer] Hand Exercises Hand exercises can be helpful to almost anyone. These exercises can strengthen the hands, improve flexibility and movement, and increase blood flow to the hands. These results can make work and daily tasks easier. Hand exercises can be especially helpful for people who have joint pain from arthritis or have nerve damage from overuse (carpal tunnel syndrome). These exercises can also help people who have injured a hand. Most of these hand exercises are fairly gentle stretching routines. You can do them often throughout the day. Still, it is a good idea to ask your health care provider which exercises would be best for you. Warming your hands before exercise may help to reduce stiffness. You can do this with gentle massage or by placing your hands in warm water for 15 minutes. Also, make sure you pay attention to your level of hand pain as you begin an exercise routine. Exercises Knuckle Bend Repeat this exercise 5-10 times with each hand. 1. Stand or sit with your arm, hand, and all five fingers pointed straight up. Make sure your wrist is straight. 2. Gently and slowly bend your fingers down and inward until the tips of your fingers are touching the tops of your palm. 3. Hold this position for a few seconds. 4. Extend your fingers out to their original position, all pointing straight up again.  Finger Fan Repeat this  exercise 5-10 times with each hand. 1. Hold your arm and hand out in front of you. Keep your wrist straight. 2. Squeeze your hand into a fist. 3. Hold this position for a few seconds. 4. Fan out, or spread apart, your hand and fingers as much as possible, stretching every joint fully.  Tabletop Repeat this exercise 5-10 times with each hand. 1. Stand or sit with your arm, hand, and all five fingers pointed straight up. Make sure your wrist is straight.   2. Gently and slowly bend your fingers at the knuckles where they meet the hand until your hand is making an upside-down L shape. Your fingers should form a tabletop. 3. Hold this position for a few seconds. 4. Extend your fingers out to their original position, all pointing straight up again.  Making Os Repeat this exercise 5-10 times with each hand. 1. Stand or sit with your arm, hand, and all five fingers pointed straight up. Make sure your wrist is straight. 2. Make an O shape by touching your pointer finger to your thumb. Hold for a few seconds. Then open your hand wide. 3. Repeat this motion with each finger on your hand.  Table Spread Repeat this exercise 5-10 times with each hand. 1. Place your hand on a table with your palm facing down. Make sure your wrist is straight. 2. Spread your fingers out as much as possible. Hold this position for a few seconds. 3. Slide your fingers back together again. Hold for a few seconds.  Ball Grip  Repeat this exercise 10-15 times with each hand. 1. Hold a tennis ball or another soft ball in your hand. 2. While slowly increasing pressure, squeeze the ball as hard as possible. 3. Squeeze as hard as you can for 3-5 seconds. 4. Relax and repeat.  Wrist Curls Repeat this exercise 10-15 times with each hand. 1. Sit in a chair that has armrests. 2. Hold a light weight in your hand, such as a dumbbell that weighs 1-3 pounds (0.5-1.4 kg). Ask your health care provider what weight would be best for  you. 3. Rest your hand just over the end of the chair arm with your palm facing up. 4. Gently pivot your wrist up and down while holding the weight. Do not twist your wrist from side to side.  Contact a health care provider if:  Your hand pain or discomfort gets much worse when you do an exercise.  Your hand pain or discomfort does not improve within 2 hours after you exercise. If you have any of these problems, stop doing these exercises right away. Do not do them again unless your health care provider says that you can. Get help right away if:  You develop sudden, severe hand pain. If this happens, stop doing these exercises right away. Do not do them again unless your health care provider says that you can. This information is not intended to replace advice given to you by your health care provider. Make sure you discuss any questions you have with your health care provider. Document Released: 11/20/2015 Document Revised: 05/16/2016 Document Reviewed: 06/19/2015 Elsevier Interactive Patient Education  2018 [employer]. Natural anti-inflammatories  You can purchase these at [employer], [employer] or online.  . Turmeric (capsules)  . Ginger (ginger root or capsules)  . Omega 3 ( flax seeds, chia seeds, walnuts, almonds)  . Tart cherry (dried or extract)   Patient should be under the care of a physician while taking these supplements. This may not be reproduced without the permission of Dr. Bo Merino.

## 2018-02-19 ENCOUNTER — Telehealth: Payer: Self-pay | Admitting: Nurse Practitioner

## 2018-02-19 NOTE — Telephone Encounter (Signed)
Still waiting for the fax, left detail message inform the pt.     Copied from [place]. Topic: General - Other >> Feb 19, 2018 11:01 AM Valla Leaver wrote: Reason for CRM: Patient will be faxing a Photographer" for NP Nche to complete. Please call patient to confirm receipt and let her know if she needs an appt to complete it.

## 2018-02-26 NOTE — Telephone Encounter (Signed)
Pt will refax again.

## 2018-02-26 NOTE — Telephone Encounter (Signed)
Caller Name: Weronika Birch Phone: [phone]  Pt has not set up voicemail yet. She will try to right now. She is needing call back to notify if paperwork was received and if Wilfred Lacy will sign and return to her insurance company. Fax # indicated on paperwork that she faxed in 02/19/18 to [phone].

## 2018-03-02 NOTE — Telephone Encounter (Signed)
Left vm inform the that we do not have the fax, still waiting.

## 2018-03-04 NOTE — Telephone Encounter (Signed)
Paper work completed and faxed to [phone]. Left detail massage inform the pt.

## 2018-03-05 ENCOUNTER — Ambulatory Visit: Payer: Medicare Other | Admitting: Psychology

## 2018-03-06 ENCOUNTER — Ambulatory Visit: Payer: Medicare Other | Admitting: Rheumatology

## 2018-03-19 ENCOUNTER — Ambulatory Visit: Payer: Medicare Other | Admitting: Psychology

## 2018-03-25 ENCOUNTER — Ambulatory Visit: Payer: Medicare Other | Admitting: Rheumatology

## 2018-03-25 ENCOUNTER — Telehealth: Payer: Self-pay | Admitting: Nurse Practitioner

## 2018-03-25 NOTE — Telephone Encounter (Signed)
Patient is calling- she thinks she has strained her back- she wants to know if she can take 2 Mobic 7.5 mg. Told patient not advised per [facility]. She did want to know if she could take Flexeril and tylenol. Told her there were no drug interactions with the Mobic with those medications. Advised her that if she does not improve- she needs to call and let us know so we can see her in the office- she states she will.

## 2019-02-07 DIAGNOSIS — F32A Depression, unspecified: Secondary | ICD-10-CM | POA: Insufficient documentation

## 2019-02-07 DIAGNOSIS — F329 Major depressive disorder, single episode, unspecified: Secondary | ICD-10-CM | POA: Insufficient documentation

## 2019-08-09 ENCOUNTER — Other Ambulatory Visit: Payer: Self-pay

## 2019-08-09 DIAGNOSIS — Z20822 Contact with and (suspected) exposure to covid-19: Secondary | ICD-10-CM

## 2019-08-10 LAB — NOVEL CORONAVIRUS, NAA: SARS-CoV-2, NAA: NOT DETECTED

## 2019-09-08 ENCOUNTER — Ambulatory Visit: Payer: Medicare Other | Admitting: Nurse Practitioner

## 2019-09-09 ENCOUNTER — Ambulatory Visit: Payer: Medicare Other | Admitting: Nurse Practitioner

## 2019-09-17 ENCOUNTER — Ambulatory Visit: Payer: Medicare Other | Admitting: Nurse Practitioner

## 2019-09-20 DIAGNOSIS — M1712 Unilateral primary osteoarthritis, left knee: Secondary | ICD-10-CM | POA: Diagnosis not present

## 2019-09-20 DIAGNOSIS — M1711 Unilateral primary osteoarthritis, right knee: Secondary | ICD-10-CM | POA: Diagnosis not present

## 2019-09-20 DIAGNOSIS — M170 Bilateral primary osteoarthritis of knee: Secondary | ICD-10-CM | POA: Diagnosis not present

## 2019-09-21 ENCOUNTER — Other Ambulatory Visit: Payer: Self-pay

## 2019-09-21 ENCOUNTER — Ambulatory Visit (INDEPENDENT_AMBULATORY_CARE_PROVIDER_SITE_OTHER): Payer: Medicare Other | Admitting: Nurse Practitioner

## 2019-09-21 ENCOUNTER — Encounter: Payer: Self-pay | Admitting: Nurse Practitioner

## 2019-09-21 VITALS — BP 130/92 | HR 61 | Temp 97.0°F | Ht 63.0 in | Wt 157.8 lb

## 2019-09-21 DIAGNOSIS — F411 Generalized anxiety disorder: Secondary | ICD-10-CM | POA: Diagnosis not present

## 2019-09-21 DIAGNOSIS — F332 Major depressive disorder, recurrent severe without psychotic features: Secondary | ICD-10-CM

## 2019-09-21 DIAGNOSIS — L853 Xerosis cutis: Secondary | ICD-10-CM

## 2019-09-21 MED ORDER — FLUOXETINE HCL 20 MG PO CAPS: ORAL_CAPSULE | ORAL | 5 refills | Status: DC

## 2019-09-21 MED ORDER — HYDROXYZINE HCL 25 MG PO TABS: 25.0000 mg | ORAL_TABLET | Freq: Three times a day (TID) | ORAL | 0 refills | Status: DC | PRN

## 2019-09-21 MED ORDER — TRIAMCINOLONE ACETONIDE 0.025 % EX CREA: 1.0000 "application " | TOPICAL_CREAM | Freq: Two times a day (BID) | CUTANEOUS | 1 refills | Status: DC

## 2019-09-21 NOTE — Progress Notes (Signed)
Subjective:  Patient ID: Betty Holmes, female    DOB: 04/16/1956  Age: 63 y.o. MRN: [id number]  CC: Follow-up (follow up anxiety--getting worse/ body itching---saw doctor in [place]--need refill for hydroxyzine? not taking prozac--adfraid of side effects. )  Betty Holmes returns from [place] after 6yr. She return last month. She is is here for chronic itching and depression.  Depression      The patient presents with depression.  This is a chronic problem.  The current episode started more than 1 year ago.   The onset quality is gradual.   The problem occurs constantly.  The problem has been waxing and waning since onset.  Associated symptoms include fatigue, helplessness, insomnia, irritable, restlessness, decreased interest, appetite change, body aches, myalgias, sad and suicidal ideas.  Associated symptoms include no headaches and no indigestion.     The symptoms are aggravated by family issues (conflict with husband).  Past treatments include SSRIs - Selective serotonin reuptake inhibitors, TCAs - Tricyclic antidepressants and psychotherapy.  Compliance with treatment is variable.  Past compliance problems include medication issues.  Previous treatment provided significant relief.  Risk factors include menopause.   Past medical history includes chronic fatigue syndrome, chronic pain, fibromyalgia, anxiety and depression.     Pertinent negatives include no hypothyroidism, no thyroid problem, no chronic illness, no recent illness, no life-threatening condition, no physical disability, no terminal illness, no recent psychiatric admission, no Alzheimer's disease, no brain trauma, no dementia, no bipolar disorder, no eating disorder, no mental health disorder, no obsessive-compulsive disorder, no post-traumatic stress disorder, no schizophrenia, no suicide attempts and no head trauma. She states she thinks of death all the time, but has no intention or plan to harm self or anyone else. She admits to  use of marijuana while in [place], but none while in [place]. Denies use of any other illicit drugs. She discontinued prozac 05/2019 thinking it was cause of generalized itching. Itching did not improve with discontinuation of prozac.  She reports generalized itching, worse under breast, groin and underarms. Denies any lice or scabies infestation. Use of vistaril once a day prn with some improvement.  Reviewed past Medical, Social and Family history today.  Outpatient Medications Prior to Visit  Medication Sig Dispense Refill  . calcium carbonate (OS-CAL) 600 MG TABS tablet Take 1 tablet (600 mg total) by mouth 2 (two) times daily with a meal. 180 tablet 1  . Vitamin D, Cholecalciferol, 1000 units TABS Take 1 tablet by mouth 2 times daily at 12 noon and 4 pm. 180 tablet 1  . hydrOXYzine (ATARAX/VISTARIL) 25 MG tablet     . ibuprofen (ADVIL,MOTRIN) 600 MG tablet TAKE 1 TABLET BY MOUTH EVERY 6 HOURS AS NEEDED FOR MODERATE PAIN  0  . cyclobenzaprine (FLEXERIL) 5 MG tablet Take 1 tablet (5 mg total) by mouth 2 (two) times daily as needed for muscle spasms. (Patient not taking: Reported on 09/21/2019) 60 tablet 1  . FLUoxetine (PROZAC) 20 MG capsule     . FLUoxetine (PROZAC) 40 MG capsule Take 1 capsule (40 mg total) by mouth daily. (Patient not taking: Reported on 09/21/2019) 90 capsule 1  . meloxicam (MOBIC) 7.5 MG tablet Take 1 tablet (7.5 mg total) by mouth daily. With food (Patient not taking: Reported on 09/21/2019) 90 tablet 1  . pregabalin (LYRICA) 75 MG capsule Take 1 capsule (75 mg total) by mouth 2 (two) times daily. (Patient not taking: Reported on 09/21/2019) 60 capsule 5   Facility-Administered Medications Prior to Visit  Medication Dose Route Frequency Provider Last Rate Last Dose  . betamethasone acetate-betamethasone sodium phosphate (CELESTONE) injection 3 mg  3 mg Intramuscular Once Daylene Katayama M, DPM        ROS See HPI  Objective:  BP (!) 130/92   Pulse 61   Temp (!) 97 F  (36.1 C) (Tympanic)   Ht 5\' 3"  (1.6 m)   Wt 157 lb 12.8 oz (71.6 kg)   SpO2 99%   BMI 27.95 kg/m   BP Readings from Last 3 Encounters:  09/21/19 (!) 130/92  02/18/18 119/82  11/11/17 110/86    Wt Readings from Last 3 Encounters:  09/21/19 157 lb 12.8 oz (71.6 kg)  02/18/18 170 lb 8 oz (77.3 kg)  11/11/17 181 lb (82.1 kg)    Physical Exam Vitals signs reviewed.  Constitutional:      General: She is irritable.  Neck:     Musculoskeletal: Normal range of motion and neck supple.  Cardiovascular:     Rate and Rhythm: Normal rate and regular rhythm.     Pulses: Normal pulses.     Heart sounds: Normal heart sounds.  Pulmonary:     Effort: Pulmonary effort is normal.     Breath sounds: Normal breath sounds.  Skin:    Findings: Rash present.       Neurological:     Mental Status: She is alert and oriented to person, place, and time.  Psychiatric:        Attention and Perception: Attention normal.        Mood and Affect: Mood normal.        Speech: Speech normal.        Behavior: Behavior normal. Behavior is cooperative.        Thought Content: Thought content normal.        Cognition and Memory: Cognition normal.        Judgment: Judgment normal.    Lab Results  Component Value Date   WBC 4.0 04/11/2017   HGB 13.9 04/11/2017   HCT 42.1 04/11/2017   PLT 219 04/11/2017   GLUCOSE 95 03/24/2017   CHOL 175 05/01/2017   TRIG 57.0 05/01/2017   HDL 68.10 05/01/2017   LDLCALC 95 05/01/2017   ALT 15 03/24/2017   AST 24 03/24/2017   NA 138 03/24/2017   K 4.2 03/24/2017   CL 104 03/24/2017   CREATININE 0.62 03/24/2017   BUN 12 03/24/2017   CO2 28 03/24/2017   TSH 1.11 05/01/2017    Dg Bone Density  Result Date: 12/21/2017 Date of study: 10/19/17 Exam: DUAL X-RAY ABSORPTIOMETRY (DXA) FOR BONE MINERAL DENSITY (BMD) Instrument: [employer] Provider: PCP Indication: follow up for low BMD Comparison: none (please note that it is not possible to  compare data from different instruments) Clinical data: Pt is a 63 y.o. female without previous history of fracture. On calcium and vitamin D. Results:  Lumbar spine L1-L4 Femoral neck (FN) 33% distal radius T-score -2.2 RFN: -1.7 LFN: -2.2 n/a Change in BMD from previous DXA test (%) n/a n/a n/a (*) statistically significant Assessment: the BMD is low according to the [facility] classification for osteoporosis (see below). Fracture risk: moderate FRAX score: 10 year major osteoporotic risk: 10.9%. 10 year hip fracture risk: 1.8%. The thresholds for treatment are 20% and 3%, respectively. Comments: the technical quality of the study is good. Evaluation for secondary causes should be considered if clinically indicated. Recommend optimizing calcium (1200 mg/day) and vitamin D (800 IU/day)  intake. Followup: Repeat BMD is appropriate after 2 years or after 1-2 years if starting treatment. WHO criteria for diagnosis of osteoporosis in postmenopausal women and in men 36 y/o or older: - normal: T-score -1.0 to + 1.0 - osteopenia/low bone density: T-score between -2.5 and -1.0 - osteoporosis: T-score below -2.5 - severe osteoporosis: T-score below -2.5 with history of fragility fracture Note: although not part of the WHO classification, the presence of a fragility fracture, regardless of the T-score, should be considered diagnostic of osteoporosis, provided other causes for the fracture have been excluded. Treatment: The National Osteoporosis Foundation recommends that treatment be considered in postmenopausal women and men age 65 or older with: 1. Hip or vertebral (clinical or morphometric) fracture 2. T-score of - 2.5 or lower at the spine or hip 3. 10-year fracture probability by FRAX of at least 20% for a major osteoporotic fracture and 3% for a hip fracture Loura Pardon MD    Assessment & Plan:   Betty Holmes was seen today for follow-up.  Diagnoses and all orders for this visit:  Severe episode of recurrent major  depressive disorder, without psychotic features ([place]) -     FLUoxetine (PROZAC) 20 MG capsule; Take 1 capsule (20 mg total) by mouth daily for 7 days, THEN 2 capsules (40 mg total) daily for 23 days.  Dry skin dermatitis -     hydrOXYzine (ATARAX/VISTARIL) 25 MG tablet; Take 1 tablet (25 mg total) by mouth every 8 (eight) hours as needed for anxiety or itching. -     triamcinolone (KENALOG) 0.025 % cream; Apply 1 application topically 2 (two) times daily.  GAD (generalized anxiety disorder) -     FLUoxetine (PROZAC) 20 MG capsule; Take 1 capsule (20 mg total) by mouth daily for 7 days, THEN 2 capsules (40 mg total) daily for 23 days. -     hydrOXYzine (ATARAX/VISTARIL) 25 MG tablet; Take 1 tablet (25 mg total) by mouth every 8 (eight) hours as needed for anxiety or itching.   I have discontinued Meyer Russel. Ritthaler's ibuprofen, meloxicam, pregabalin, FLUoxetine, cyclobenzaprine, and FLUoxetine. I have also changed her hydrOXYzine. Additionally, I am having her start on FLUoxetine and triamcinolone. Lastly, I am having her maintain her Vitamin D (Cholecalciferol) and calcium carbonate. We will continue to administer betamethasone acetate-betamethasone sodium phosphate.  Meds ordered this encounter  Medications  . FLUoxetine (PROZAC) 20 MG capsule    Sig: Take 1 capsule (20 mg total) by mouth daily for 7 days, THEN 2 capsules (40 mg total) daily for 23 days.    Dispense:  53 capsule    Refill:  5    Order Specific Question:   Supervising Provider    Answer:   Lucille Passy [[id number]]  . hydrOXYzine (ATARAX/VISTARIL) 25 MG tablet    Sig: Take 1 tablet (25 mg total) by mouth every 8 (eight) hours as needed for anxiety or itching.    Dispense:  30 tablet    Refill:  0    Order Specific Question:   Supervising Provider    Answer:   Lucille Passy [[id number]]  . triamcinolone (KENALOG) 0.025 % cream    Sig: Apply 1 application topically 2 (two) times daily.    Dispense:  30 g    Refill:  1    Order  Specific Question:   Supervising Provider    Answer:   Lucille Passy [[id number]]    Problem List Items Addressed This Visit      Other  GAD (generalized anxiety disorder)   Relevant Medications   FLUoxetine (PROZAC) 20 MG capsule   hydrOXYzine (ATARAX/VISTARIL) 25 MG tablet   Recurrent major depressive disorder, in partial remission (HCC) - Primary   Relevant Medications   FLUoxetine (PROZAC) 20 MG capsule   hydrOXYzine (ATARAX/VISTARIL) 25 MG tablet    Other Visit Diagnoses    Dry skin dermatitis       Relevant Medications   hydrOXYzine (ATARAX/VISTARIL) 25 MG tablet   triamcinolone (KENALOG) 0.025 % cream      Follow-up: Return in about 2 weeks (around 10/05/2019) for CPE (fasting).  Wilfred Lacy, NP

## 2019-09-21 NOTE — Patient Instructions (Signed)
Please contact [facility] to schedule appt with therapist: [phone].  Call 911 if suicidal thoughts become pervasive.  Use unscented moisturizer twice a day (cetaphil or Cerave) and adequate oral hydration to help with dry skin.  Living With Depression Everyone experiences occasional disappointment, sadness, and loss in their lives. When you are feeling down, blue, or sad for at least 2 weeks in a row, it may mean that you have depression. Depression can affect your thoughts and feelings, relationships, daily activities, and physical health. It is caused by changes in the way your brain functions. If you receive a diagnosis of depression, your health care provider will tell you which type of depression you have and what treatment options are available to you. If you are living with depression, there are ways to help you recover from it and also ways to prevent it from coming back. How to cope with lifestyle changes Coping with stress     Stress is your body's reaction to life changes and events, both good and bad. Stressful situations may include:  Getting married.  The death of a spouse.  Losing a job.  Retiring.  Having a baby. Stress can last just a few hours or it can be ongoing. Stress can play a major role in depression, so it is important to learn both how to cope with stress and how to think about it differently. Talk with your health care provider or a counselor if you would like to learn more about stress reduction. He or she may suggest some stress reduction techniques, such as:  Music therapy. This can include creating music or listening to music. Choose music that you enjoy and that inspires you.  Mindfulness-based meditation. This kind of meditation can be done while sitting or walking. It involves being aware of your normal breaths, rather than trying to control your breathing.  Centering prayer. This is a kind of meditation that involves focusing on a  spiritual word or phrase. Choose a word, phrase, or sacred image that is meaningful to you and that brings you peace.  Deep breathing. To do this, expand your stomach and inhale slowly through your nose. Hold your breath for 3-5 seconds, then exhale slowly, allowing your stomach muscles to relax.  Muscle relaxation. This involves intentionally tensing muscles then relaxing them. Choose a stress reduction technique that fits your lifestyle and personality. Stress reduction techniques take time and practice to develop. Set aside 5-15 minutes a day to do them. Therapists can offer training in these techniques. The training may be covered by some insurance plans. Other things you can do to manage stress include:  Keeping a stress diary. This can help you learn what triggers your stress and ways to control your response.  Understanding what your limits are and saying no to requests or events that lead to a schedule that is too full.  Thinking about how you respond to certain situations. You may not be able to control everything, but you can control how you react.  Adding humor to your life by watching funny films or TV shows.  Making time for activities that help you relax and not feeling guilty about spending your time this way.  Medicines Your health care provider may suggest certain medicines if he or she feels that they will help improve your condition. Avoid using alcohol and other substances that may prevent your medicines from working properly (may interact). It is also important to:  Talk with your pharmacist or health care  provider about all the medicines that you take, their possible side effects, and what medicines are safe to take together.  Make it your goal to take part in all treatment decisions (shared decision-making). This includes giving input on the side effects of medicines. It is best if shared decision-making with your health care provider is part of your total treatment plan.  If your health care provider prescribes a medicine, you may not notice the full benefits of it for 4-8 weeks. Most people who are treated for depression need to be on medicine for at least 6-12 months after they feel better. If you are taking medicines as part of your treatment, do not stop taking medicines without first talking to your health care provider. You may need to have the medicine slowly decreased (tapered) over time to decrease the risk of harmful side effects. Relationships Your health care provider may suggest family therapy along with individual therapy and drug therapy. While there may not be family problems that are causing you to feel depressed, it is still important to make sure your family learns as much as they can about your mental health. Having your family's support can help make your treatment successful. How to recognize changes in your condition Everyone has a different response to treatment for depression. Recovery from major depression happens when you have not had signs of major depression for two months. This may mean that you will start to:  Have more interest in doing activities.  Feel less hopeless than you did 2 months ago.  Have more energy.  Overeat less often, or have better or improving appetite.  Have better concentration. Your health care provider will work with you to decide the next steps in your recovery. It is also important to recognize when your condition is getting worse. Watch for these signs:  Having fatigue or low energy.  Eating too much or too little.  Sleeping too much or too little.  Feeling restless, agitated, or hopeless.  Having trouble concentrating or making decisions.  Having unexplained physical complaints.  Feeling irritable, angry, or aggressive. Get help as soon as you or your family members notice these symptoms coming back. How to get support and help from others How to talk with friends and family members about your  condition  Talking to friends and family members about your condition can provide you with one way to get support and guidance. Reach out to trusted friends or family members, explain your symptoms to them, and let them know that you are working with a health care provider to treat your depression. Financial resources Not all insurance plans cover mental health care, so it is important to check with your insurance carrier. If paying for co-pays or counseling services is a problem, search for a local or county mental health care center. They may be able to offer public mental health care services at low or no cost when you are not able to see a private health care provider. If you are taking medicine for depression, you may be able to get the generic form, which may be less expensive. Some makers of prescription medicines also offer help to patients who cannot afford the medicines they need. Follow these instructions at home:   Get the right amount and quality of sleep.  Cut down on using caffeine, tobacco, alcohol, and other potentially harmful substances.  Try to exercise, such as walking or lifting small weights.  Take over-the-counter and prescription medicines only as told by your  health care provider.  Eat a healthy diet that includes plenty of vegetables, fruits, whole grains, low-fat dairy products, and lean protein. Do not eat a lot of foods that are high in solid fats, added sugars, or salt.  Keep all follow-up visits as told by your health care provider. This is important. Contact a health care provider if:  You stop taking your antidepressant medicines, and you have any of these symptoms: ? Nausea. ? Headache. ? Feeling lightheaded. ? Chills and body aches. ? Not being able to sleep (insomnia).  You or your friends and family think your depression is getting worse. Get help right away if:  You have thoughts of hurting yourself or others. If you ever feel like you may hurt  yourself or others, or have thoughts about taking your own life, get help right away. You can go to your nearest emergency department or call:  Your local emergency services (911 in the [place]  A suicide crisis helpline, such as the [facility] at (623)649-8127. This is open 24-hours a day. Summary  If you are living with depression, there are ways to help you recover from it and also ways to prevent it from coming back.  Work with your health care team to create a management plan that includes counseling, stress management techniques, and healthy lifestyle habits. This information is not intended to replace advice given to you by your health care provider. Make sure you discuss any questions you have with your health care provider. Document Released: 11/11/2016 Document Revised: 04/02/2019 Document Reviewed: 11/11/2016 Elsevier Patient Education  2020 [employer].

## 2019-09-22 ENCOUNTER — Encounter: Payer: Self-pay | Admitting: Nurse Practitioner

## 2019-10-05 ENCOUNTER — Telehealth: Payer: Self-pay | Admitting: Nurse Practitioner

## 2019-10-05 NOTE — Telephone Encounter (Signed)

## 2019-10-06 ENCOUNTER — Other Ambulatory Visit: Payer: Self-pay

## 2019-10-06 ENCOUNTER — Encounter: Payer: Self-pay | Admitting: Nurse Practitioner

## 2019-10-06 ENCOUNTER — Ambulatory Visit (INDEPENDENT_AMBULATORY_CARE_PROVIDER_SITE_OTHER): Payer: Medicare Other | Admitting: Nurse Practitioner

## 2019-10-06 VITALS — BP 118/80 | HR 59 | Temp 97.0°F | Ht 63.0 in | Wt 156.8 lb

## 2019-10-06 DIAGNOSIS — Z0001 Encounter for general adult medical examination with abnormal findings: Secondary | ICD-10-CM | POA: Diagnosis not present

## 2019-10-06 DIAGNOSIS — Z136 Encounter for screening for cardiovascular disorders: Secondary | ICD-10-CM

## 2019-10-06 DIAGNOSIS — Z13220 Encounter for screening for lipoid disorders: Secondary | ICD-10-CM | POA: Diagnosis not present

## 2019-10-06 DIAGNOSIS — E559 Vitamin D deficiency, unspecified: Secondary | ICD-10-CM

## 2019-10-06 DIAGNOSIS — M818 Other osteoporosis without current pathological fracture: Secondary | ICD-10-CM | POA: Diagnosis not present

## 2019-10-06 DIAGNOSIS — L853 Xerosis cutis: Secondary | ICD-10-CM

## 2019-10-06 DIAGNOSIS — F411 Generalized anxiety disorder: Secondary | ICD-10-CM

## 2019-10-06 DIAGNOSIS — F332 Major depressive disorder, recurrent severe without psychotic features: Secondary | ICD-10-CM

## 2019-10-06 DIAGNOSIS — Z1211 Encounter for screening for malignant neoplasm of colon: Secondary | ICD-10-CM

## 2019-10-06 LAB — COMPREHENSIVE METABOLIC PANEL
ALT: 8 U/L (ref 0–35)
AST: 13 U/L (ref 0–37)
Albumin: 4.2 g/dL (ref 3.5–5.2)
Alkaline Phosphatase: 44 U/L (ref 39–117)
BUN: 11 mg/dL (ref 6–23)
CO2: 27 mEq/L (ref 19–32)
Calcium: 9.2 mg/dL (ref 8.4–10.5)
Chloride: 103 mEq/L (ref 96–112)
Creatinine, Ser: 0.57 mg/dL (ref 0.40–1.20)
GFR: 106.45 mL/min (ref >60.00)
Glucose, Bld: 88 mg/dL (ref 70–99)
Potassium: 4.1 mEq/L (ref 3.5–5.1)
Sodium: 138 mEq/L (ref 135–145)
Total Bilirubin: 0.6 mg/dL (ref 0.2–1.2)
Total Protein: 6.3 g/dL (ref 6.0–8.3)

## 2019-10-06 LAB — CBC WITH DIFFERENTIAL/PLATELET
Basophils Absolute: 0 10*3/uL (ref 0.0–0.1)
Basophils Relative: 1.2 % (ref 0.0–3.0)
Eosinophils Absolute: 0.1 10*3/uL (ref 0.0–0.7)
Eosinophils Relative: 2 % (ref 0.0–5.0)
HCT: 40 % (ref 36.0–46.0)
Hemoglobin: 13.4 g/dL (ref 12.0–15.0)
Lymphocytes Relative: 36 % (ref 12.0–46.0)
Lymphs Abs: 1.4 10*3/uL (ref 0.7–4.0)
MCHC: 33.4 g/dL (ref 30.0–36.0)
MCV: 91.3 fl (ref 78.0–100.0)
Monocytes Absolute: 0.2 10*3/uL (ref 0.1–1.0)
Monocytes Relative: 5.9 % (ref 3.0–12.0)
Neutro Abs: 2.2 10*3/uL (ref 1.4–7.7)
Neutrophils Relative %: 54.9 % (ref 43.0–77.0)
Platelets: 196 10*3/uL (ref 150.0–400.0)
RBC: 4.39 Mil/uL (ref 3.87–5.11)
RDW: 12.9 % (ref 11.5–15.5)
WBC: 4 10*3/uL (ref 4.0–10.5)

## 2019-10-06 LAB — LIPID PANEL
Cholesterol: 192 mg/dL (ref 0–200)
HDL: 68.9 mg/dL (ref >39.00)
LDL Cholesterol: 109 mg/dL — ABNORMAL HIGH (ref 0–99)
NonHDL: 122.8
Total CHOL/HDL Ratio: 3
Triglycerides: 67 mg/dL (ref 0.0–149.0)
VLDL: 13.4 mg/dL (ref 0.0–40.0)

## 2019-10-06 LAB — TSH: TSH: 1.76 u[IU]/mL (ref 0.35–4.50)

## 2019-10-06 MED ORDER — FLUOXETINE HCL 20 MG PO CAPS: 20.0000 mg | ORAL_CAPSULE | Freq: Two times a day (BID) | ORAL | 5 refills | Status: DC

## 2019-10-06 MED ORDER — NYSTATIN 100000 UNIT/GM EX OINT: 1.0000 "application " | TOPICAL_OINTMENT | Freq: Two times a day (BID) | CUTANEOUS | 0 refills | Status: DC

## 2019-10-06 NOTE — Patient Instructions (Addendum)
Go to lab for blood draw and fecal occult stool collection kit. Submit fecal occult kit when collected.  We need previous colonoscopy report. Have report faxed to me.  Schedule annual eye exam and mammogram  You will be contacted to schedule bone density.  Stop triamcinolone cream and start nystatin ointment on rash.  Health Maintenance, Female Adopting a healthy lifestyle and getting preventive care are important in promoting health and wellness. Ask your health care provider about:  The right schedule for you to have regular tests and exams.  Things you can do on your own to prevent diseases and keep yourself healthy. What should I know about diet, weight, and exercise? Eat a healthy diet   Eat a diet that includes plenty of vegetables, fruits, low-fat dairy products, and lean protein.  Do not eat a lot of foods that are high in solid fats, added sugars, or sodium. Maintain a healthy weight Body mass index (BMI) is used to identify weight problems. It estimates body fat based on height and weight. Your health care provider can help determine your BMI and help you achieve or maintain a healthy weight. Get regular exercise Get regular exercise. This is one of the most important things you can do for your health. Most adults should:  Exercise for at least 150 minutes each week. The exercise should increase your heart rate and make you sweat (moderate-intensity exercise).  Do strengthening exercises at least twice a week. This is in addition to the moderate-intensity exercise.  Spend less time sitting. Even light physical activity can be beneficial. Watch cholesterol and blood lipids Have your blood tested for lipids and cholesterol at 66 years of age, then have this test every 5 years. Have your cholesterol levels checked more often if:  Your lipid or cholesterol levels are high.  You are older than 65 years of age.  You are at high risk for heart disease. What should I know  about cancer screening? Depending on your health history and family history, you may need to have cancer screening at various ages. This may include screening for:  Breast cancer.  Cervical cancer.  Colorectal cancer.  Skin cancer.  Lung cancer. What should I know about heart disease, diabetes, and high blood pressure? Blood pressure and heart disease  High blood pressure causes heart disease and increases the risk of stroke. This is more likely to develop in people who have high blood pressure readings, are of African descent, or are overweight.  Have your blood pressure checked: ? Every 3-5 years if you are 71-10 years of age. ? Every year if you are 22 years old or older. Diabetes Have regular diabetes screenings. This checks your fasting blood sugar level. Have the screening done:  Once every three years after age 64 if you are at a normal weight and have a low risk for diabetes.  More often and at a younger age if you are overweight or have a high risk for diabetes. What should I know about preventing infection? Hepatitis B If you have a higher risk for hepatitis B, you should be screened for this virus. Talk with your health care provider to find out if you are at risk for hepatitis B infection. Hepatitis C Testing is recommended for:  Everyone born from [birth year] through 1967.  Anyone with known risk factors for hepatitis C. Sexually transmitted infections (STIs)  Get screened for STIs, including gonorrhea and chlamydia, if: ? You are sexually active and are younger than 64  years of age. ? You are older than 65 years of age and your health care provider tells you that you are at risk for this type of infection. ? Your sexual activity has changed since you were last screened, and you are at increased risk for chlamydia or gonorrhea. Ask your health care provider if you are at risk.  Ask your health care provider about whether you are at high risk for HIV. Your health care  provider may recommend a prescription medicine to help prevent HIV infection. If you choose to take medicine to prevent HIV, you should first get tested for HIV. You should then be tested every 3 months for as long as you are taking the medicine. Pregnancy  If you are about to stop having your period (premenopausal) and you may become pregnant, seek counseling before you get pregnant.  Take 400 to 800 micrograms (mcg) of folic acid every day if you become pregnant.  Ask for birth control (contraception) if you want to prevent pregnancy. Osteoporosis and menopause Osteoporosis is a disease in which the bones lose minerals and strength with aging. This can result in bone fractures. If you are 33 years old or older, or if you are at risk for osteoporosis and fractures, ask your health care provider if you should:  Be screened for bone loss.  Take a calcium or vitamin D supplement to lower your risk of fractures.  Be given hormone replacement therapy (HRT) to treat symptoms of menopause. Follow these instructions at home: Lifestyle  Do not use any products that contain nicotine or tobacco, such as cigarettes, e-cigarettes, and chewing tobacco. If you need help quitting, ask your health care provider.  Do not use street drugs.  Do not share needles.  Ask your health care provider for help if you need support or information about quitting drugs. Alcohol use  Do not drink alcohol if: ? Your health care provider tells you not to drink. ? You are pregnant, may be pregnant, or are planning to become pregnant.  If you drink alcohol: ? Limit how much you use to 0-1 drink a day. ? Limit intake if you are breastfeeding.  Be aware of how much alcohol is in your drink. In the [place] one drink equals one 12 oz bottle of beer (355 mL), one 5 oz glass of wine (148 mL), or one 1 oz glass of hard liquor (44 mL). General instructions  Schedule regular health, dental, and eye exams.  Stay current  with your vaccines.  Tell your health care provider if: ? You often feel depressed. ? You have ever been abused or do not feel safe at home. Summary  Adopting a healthy lifestyle and getting preventive care are important in promoting health and wellness.  Follow your health care provider's instructions about healthy diet, exercising, and getting tested or screened for diseases.  Follow your health care provider's instructions on monitoring your cholesterol and blood pressure. This information is not intended to replace advice given to you by your health care provider. Make sure you discuss any questions you have with your health care provider. Document Released: 06/24/2011 Document Revised: 12/02/2018 Document Reviewed: 12/02/2018 Elsevier Patient Education  2020 [employer].

## 2019-10-06 NOTE — Progress Notes (Signed)
Subjective:    Patient ID: Betty Holmes, female    DOB: 11-Sep-1956, 63 y.o.   MRN: [id number]  Patient presents today for complete physical and chronic conditions  HPI  Generalized itching: Unable to tolerate triamcinolone, caused burning sensation. Use of vistaril as needed with significant relief.  Osteoporosis: Last bone density 2018: lumbar spine and femoral head  T-score of -2.2. reports she has not been compliant with calcium and vitamin D supplement. Has hx of vitamin D deficiency.  Sexual History (orientation,birth control, marital status, STD):married, not sexually active, up to date with PAP (normal), states she will schedule mammogram.  Depression/Suicide:improved mood with prozac, current dose is 20mg  BID per patient, she denies any SI/HI Depression screen [facility] 2/9 09/21/2019 04/01/2017  Decreased Interest 2 3  Down, Depressed, Hopeless - 3  PHQ - 2 Score 2 6  Altered sleeping 3 1  Tired, decreased energy 3 3  Change in appetite 3 0  Feeling bad or failure about yourself  3 0  Trouble concentrating 3 3  Moving slowly or fidgety/restless 2 0  Suicidal thoughts 2 0  PHQ-9 Score 21 13   Vision:needed, she will schedule  Dental:needed but she has no dental insurance  Immunizations: (TDAP, Hep C screen, Pneumovax, Influenza, zoster): she reports colonoscopy was done in another state 5yrs ago. She is unable to remember name of facility at this time. Health Maintenance  Topic Date Due  . Mammogram  12/10/2017  . Stool Blood Test  11/11/2018  . Flu Shot  03/22/2020*  . Pap Smear  11/11/2020  . Colon Cancer Screening  02/22/2024  . Tetanus Vaccine  04/22/2025  .  Hepatitis C: One time screening is recommended by Center for Disease Control  (CDC) for  adults born from [birth year] through 1967.   Completed  . HIV Screening  Completed  *Topic was postponed. The date shown is not the original due date.   Diet:regular.  Weight:  Wt Readings from Last 3 Encounters:   10/06/19 156 lb 12.8 oz (71.1 kg)  09/21/19 157 lb 12.8 oz (71.6 kg)  02/18/18 170 lb 8 oz (77.3 kg)   Exercise:none  Fall Risk: Fall Risk  09/21/2019 04/01/2017  Falls in the past year? 0 No   Advanced Directive:not interested Advanced Directives 07/09/2017  Does Patient Have a Medical Advance Directive? No  Would patient like information on creating a medical advance directive? -     Medications and allergies reviewed with patient and updated if appropriate.  Patient Active Problem List   Diagnosis Date Noted  . GAD (generalized anxiety disorder) 09/21/2019  . Anxiety and depression 02/07/2019  . Pain in left wrist 07/24/2017  . Painful wrist, right 07/24/2017  . Osteoporosis 04/21/2017  . Cervical stenosis of spine 04/21/2017  . Vitamin D deficiency 04/21/2017  . Recurrent major depressive disorder, in partial remission ([place]) 04/09/2017  . Chronic low back pain 04/09/2017  . Fibromyalgia     Current Outpatient Medications on File Prior to Visit  Medication Sig Dispense Refill  . hydrOXYzine (ATARAX/VISTARIL) 25 MG tablet Take 1 tablet (25 mg total) by mouth every 8 (eight) hours as needed for anxiety or itching. 30 tablet 0  . Vitamin D, Cholecalciferol, 1000 units TABS Take 1 tablet by mouth 2 times daily at 12 noon and 4 pm. (Patient not taking: Reported on 10/06/2019) 180 tablet 1   Current Facility-Administered Medications on File Prior to Visit  Medication Dose Route Frequency Provider Last Rate Last Dose  .  betamethasone acetate-betamethasone sodium phosphate (CELESTONE) injection 3 mg  3 mg Intramuscular Once Edrick Kins, DPM        Past Medical History:  Diagnosis Date  . Anxiety   . Arthritis   . At high risk for tick borne illness    had abx--test her again--she was okey---[facility]  . Depression   . Fibromyalgia   . Fibromyalgia     Past Surgical History:  Procedure Laterality Date  . CESAREAN SECTION    . CYST EXCISION Right  05/09/2017   Procedure: EXCISION RIGHT INGUINAL CYST;  Surgeon: Clovis Riley, MD;  Location: [facility];  Service: General;  Laterality: Right;  . KNEE ARTHROSCOPY    . KNEE SURGERY    . OVARIAN CYST REMOVAL      Social History   Socioeconomic History  . Marital status: Legally Separated    Spouse name: Not on file  . Number of children: Not on file  . Years of education: Not on file  . Highest education level: Not on file  Occupational History  . Not on file  Social Needs  . Financial resource strain: Not on file  . Food insecurity    Worry: Not on file    Inability: Not on file  . Transportation needs    Medical: Not on file    Non-medical: Not on file  Tobacco Use  . Smoking status: Never Smoker  . Smokeless tobacco: Never Used  Substance and Sexual Activity  . Alcohol use: No  . Drug use: No  . Sexual activity: Not Currently    Birth control/protection: Post-menopausal  Lifestyle  . Physical activity    Days per week: Not on file    Minutes per session: Not on file  . Stress: Not on file  Relationships  . Social Herbalist on phone: Not on file    Gets together: Not on file    Attends religious service: Not on file    Active member of club or organization: Not on file    Attends meetings of clubs or organizations: Not on file    Relationship status: Not on file  Other Topics Concern  . Not on file  Social History Narrative  . Not on file    Family History  Problem Relation Age of Onset  . Alzheimer's disease Mother   . Heart disease Father   . Alzheimer's disease Maternal Aunt   . Alzheimer's disease Maternal Uncle   . Alzheimer's disease Maternal Grandmother         Review of Systems  Constitutional: Negative for fever, malaise/fatigue and weight loss.  HENT: Negative for congestion and sore throat.   Eyes:       Negative for visual changes  Respiratory: Negative for cough and shortness of breath.   Cardiovascular:  Negative for chest pain, palpitations and leg swelling.  Gastrointestinal: Negative for blood in stool, constipation, diarrhea and heartburn.  Genitourinary: Negative for dysuria, frequency and urgency.  Musculoskeletal: Negative for falls, joint pain and myalgias.  Skin: Positive for itching and rash.  Neurological: Negative for dizziness, sensory change and headaches.  Endo/Heme/Allergies: Does not bruise/bleed easily.  Psychiatric/Behavioral: Positive for depression. Negative for substance abuse and suicidal ideas. The patient is not nervous/anxious and does not have insomnia.     Objective:   Vitals:   10/06/19 0812  BP: 118/80  Pulse: (!) 59  Temp: (!) 97 F (36.1 C)  SpO2: 96%  Body mass index is 27.78 kg/m.  Physical Examination:  Physical Exam Vitals signs reviewed.  Constitutional:      General: She is not in acute distress. HENT:     Right Ear: Tympanic membrane, ear canal and external ear normal.     Left Ear: Tympanic membrane, ear canal and external ear normal.     Nose: Nose normal.     Mouth/Throat:     Pharynx: No oropharyngeal exudate.  Eyes:     General: No scleral icterus.    Conjunctiva/sclera: Conjunctivae normal.     Pupils: Pupils are equal, round, and reactive to light.  Neck:     Musculoskeletal: Normal range of motion and neck supple.     Thyroid: No thyromegaly.  Cardiovascular:     Rate and Rhythm: Normal rate.     Pulses: Normal pulses.     Heart sounds: Normal heart sounds.  Pulmonary:     Effort: Pulmonary effort is normal.     Breath sounds: Normal breath sounds.  Chest:     Chest wall: No tenderness.  Abdominal:     General: Bowel sounds are normal. There is no distension.     Palpations: Abdomen is soft.     Tenderness: There is no abdominal tenderness.  Musculoskeletal: Normal range of motion.        General: No tenderness.     Right lower leg: No edema.     Left lower leg: No edema.  Lymphadenopathy:     Cervical: No  cervical adenopathy.  Skin:    General: Skin is warm and dry.     Findings: Rash present.  Neurological:     Mental Status: She is alert and oriented to person, place, and time.  Psychiatric:        Judgment: Judgment normal.     ASSESSMENT and PLAN:  Diamonde was seen today for annual exam.  Diagnoses and all orders for this visit:  Encounter for preventative adult health care exam with abnormal findings -     Comprehensive metabolic panel -     Lipid panel -     TSH -     CBC with Differential/Platelet  Encounter for lipid screening for cardiovascular disease -     Lipid panel  Other osteoporosis without current pathological fracture -     DG Bone Density; Future  Vitamin D deficiency -     Vitamin D 1,25 dihydroxy  Dry skin dermatitis -     nystatin ointment (MYCOSTATIN); Apply 1 application topically 2 (two) times daily.  Encounter for screening fecal occult blood testing -     Fecal occult blood, imunochemical([facility]); Future  Severe episode of recurrent major depressive disorder, without psychotic features ([place]) -     FLUoxetine (PROZAC) 20 MG capsule; Take 1 capsule (20 mg total) by mouth 2 (two) times daily.  GAD (generalized anxiety disorder) -     FLUoxetine (PROZAC) 20 MG capsule; Take 1 capsule (20 mg total) by mouth 2 (two) times daily.    No problem-specific Assessment & Plan notes found for this encounter.      Problem List Items Addressed This Visit      Musculoskeletal and Integument   Osteoporosis   Relevant Orders   DG Bone Density     Other   GAD (generalized anxiety disorder)   Relevant Medications   FLUoxetine (PROZAC) 20 MG capsule   Vitamin D deficiency   Relevant Orders   Vitamin D 1,25 dihydroxy  Other Visit Diagnoses    Encounter for preventative adult health care exam with abnormal findings    -  Primary   Relevant Orders   Comprehensive metabolic panel   Lipid panel   TSH   CBC with Differential/Platelet    Encounter for lipid screening for cardiovascular disease       Relevant Orders   Lipid panel   Dry skin dermatitis       Relevant Medications   nystatin ointment (MYCOSTATIN)   Encounter for screening fecal occult blood testing       Relevant Orders   Fecal occult blood, imunochemical([facility])   Severe episode of recurrent major depressive disorder, without psychotic features ([place])       Relevant Medications   FLUoxetine (PROZAC) 20 MG capsule       Follow up: Return in about 4 weeks (around 11/03/2019) for anxiety and depression.  Wilfred Lacy, NP

## 2019-10-09 LAB — VITAMIN D 1,25 DIHYDROXY
Vitamin D 1, 25 (OH)2 Total: 34 pg/mL (ref 18–72)
Vitamin D2 1, 25 (OH)2: <8 pg/mL
Vitamin D3 1, 25 (OH)2: 34 pg/mL

## 2019-10-18 ENCOUNTER — Telehealth: Payer: Self-pay | Admitting: Nurse Practitioner

## 2019-10-18 ENCOUNTER — Other Ambulatory Visit: Payer: Self-pay | Admitting: Nurse Practitioner

## 2019-10-18 DIAGNOSIS — L853 Xerosis cutis: Secondary | ICD-10-CM

## 2019-10-18 DIAGNOSIS — F411 Generalized anxiety disorder: Secondary | ICD-10-CM

## 2019-10-18 NOTE — Telephone Encounter (Signed)
LVM for the pt to call back.

## 2019-10-18 NOTE — Telephone Encounter (Signed)
Pt is aware that new rx sent in for 6 mo in 10/06/2019.

## 2019-10-18 NOTE — Telephone Encounter (Signed)
[name] please advise, pt stated she has some left but she might be using it a little more since she will be traveling soon (pt stated she use it when she travel as well).

## 2019-10-18 NOTE — Telephone Encounter (Signed)
FLUoxetine (PROZAC) 20 MG capsule  Pt states that once she begins taking this medication twice per day there will not be enough medication to last until her next refill.  Please advise.   Pharmacy:   [id number] - [place], [place] [id number] (Phone) [phone] (Fax)

## 2019-11-09 ENCOUNTER — Other Ambulatory Visit: Payer: Self-pay | Admitting: Nurse Practitioner

## 2019-11-09 DIAGNOSIS — F411 Generalized anxiety disorder: Secondary | ICD-10-CM

## 2019-11-09 DIAGNOSIS — F332 Major depressive disorder, recurrent severe without psychotic features: Secondary | ICD-10-CM

## 2019-11-09 NOTE — Telephone Encounter (Signed)
pharmacy request 90 days supply sent in. rx sent and request pharmacy to cancel remaining refill since new rx sent.

## 2019-11-17 ENCOUNTER — Other Ambulatory Visit: Payer: Self-pay

## 2019-11-17 DIAGNOSIS — Z20822 Contact with and (suspected) exposure to covid-19: Secondary | ICD-10-CM

## 2019-11-19 LAB — NOVEL CORONAVIRUS, NAA: SARS-CoV-2, NAA: NOT DETECTED

## 2019-12-02 ENCOUNTER — Encounter: Payer: Self-pay | Admitting: Physical Therapy

## 2019-12-02 ENCOUNTER — Other Ambulatory Visit: Payer: Self-pay

## 2019-12-02 ENCOUNTER — Ambulatory Visit: Payer: Medicare Other | Attending: Physician Assistant | Admitting: Physical Therapy

## 2019-12-02 DIAGNOSIS — R2689 Other abnormalities of gait and mobility: Secondary | ICD-10-CM

## 2019-12-02 DIAGNOSIS — M6281 Muscle weakness (generalized): Secondary | ICD-10-CM | POA: Diagnosis present

## 2019-12-02 DIAGNOSIS — G8929 Other chronic pain: Secondary | ICD-10-CM | POA: Diagnosis present

## 2019-12-02 DIAGNOSIS — M25562 Pain in left knee: Secondary | ICD-10-CM | POA: Insufficient documentation

## 2019-12-02 NOTE — Therapy (Addendum)
[place], [place], [address] Phone: [phone]   Fax:  [phone]  Physical Therapy Evaluation / Discharge  Patient Details  Name: Betty Holmes MRN: [id number] Date of Birth: [birth year] Referring Provider (PT): Drue Novel, [place]   Encounter Date: 12/02/2019  PT End of Session - 12/02/19 1000    Visit Number  1    Number of Visits  8    Date for PT Re-Evaluation  01/27/20    Authorization Type  [employer] MCR    PT Start Time  1000    PT Stop Time  1039    PT Time Calculation (min)  39 min    Activity Tolerance  Patient tolerated treatment well    Behavior During Therapy  [facility] for tasks assessed/performed       Past Medical History:  Diagnosis Date  . Anxiety   . Arthritis   . At high risk for tick borne illness    had abx--test her again--she was okey---at [facility]  . Depression   . Fibromyalgia   . Fibromyalgia     Past Surgical History:  Procedure Laterality Date  . CESAREAN SECTION    . CYST EXCISION Right 05/09/2017   Procedure: EXCISION RIGHT INGUINAL CYST;  Surgeon: Clovis Riley, MD;  Location: [place];  Service: General;  Laterality: Right;  . KNEE ARTHROSCOPY    . KNEE SURGERY    . OVARIAN CYST REMOVAL      There were no vitals filed for this visit.   Subjective Assessment - 12/02/19 1002    Subjective  Patient reports she has a really bad left knee. She has surgery back when she was 89 for a broken knee and then meniscectomy in 2017, and over the years she has had injectons to control the pain. A few weeks ago she was walking and her knee collapsed and she was in a lot of pain. She also notes that she has a lot of pain at night that is jabbing. She has been doing exercise to strengthen her knee. She reports that she was told she had a Baker's cyst of the left knee but this has resolved/gotten better.    Limitations  Standing;Walking;House  hold activities    How long can you sit comfortably?  No limitation - will hurt to sit cross legged    How long can you stand comfortably?  A few minutes    How long can you walk comfortably?  100 ft    Diagnostic tests  X-ray - last available to see if 2016    Patient Stated Goals  Improve confidence with knee while walking    Currently in Pain?  Yes    Pain Score  0-No pain    Pain Location  Knee    Pain Orientation  Left    Pain Descriptors / Indicators  Stabbing    Pain Type  Chronic pain    Pain Onset  More than a month ago    Pain Frequency  Intermittent    Aggravating Factors   Walking, bending or moving the knee    Pain Relieving Factors  Ice, medication    Effect of Pain on Daily Activities  Patient is limited in her walking ability         [facility] PT Assessment - 12/02/19 0001      Assessment   Medical Diagnosis  Chronic left knee pain    Referring Provider (  PT)  Drue Novel, PA    Onset Date/Surgical Date  --   Long history of left knee pain, unable to give specific date   Hand Dominance  Right    Next MD Visit  Not scheduled    Prior Therapy  Yes - knees, neck, right hip      Precautions   Precautions  None      Restrictions   Weight Bearing Restrictions  No      Balance Screen   Has the patient fallen in the past 6 months  No    Has the patient had a decrease in activity level because of a fear of falling?   Yes   reports she is not confident in her knee   Is the patient reluctant to leave their home because of a fear of falling?   No      Home Film/video editor residence      Prior Function   Level of Independence  Independent    Leisure  Walking, she used to enjoy riding her bike and going to the gym      Cognition   Overall Cognitive Status  Within Functional Limits for tasks assessed      Observation/Other Assessments   Observations  Patient appears in no apparent distress    Focus on Therapeutic Outcomes (FOTO)   60%  limitation      Sensation   Light Touch  Appears Intact      Functional Tests   Functional tests  Single leg stance      Single Leg Stance   Comments  Patient exhibits balance deficit on single leg bilaterally with greater difficulty on left, <3 sec bilat      Posture/Postural Control   Posture/Postural Control  No significant limitations      ROM / Strength   AROM / PROM / Strength  AROM;Strength      AROM   AROM Assessment Site  Knee    Right/Left Knee  Left    Left Knee Extension  2   lacking   Left Knee Flexion  140      Strength   Strength Assessment Site  Hip;Knee    Right/Left Hip  Right;Left    Right Hip Flexion  4/5    Right Hip Extension  4/5    Right Hip ABduction  4-/5    Left Hip Flexion  4/5    Left Hip Extension  4/5    Left Hip ABduction  4-/5    Right/Left Knee  Right;Left    Right Knee Flexion  4+/5    Right Knee Extension  5/5    Left Knee Flexion  4/5    Left Knee Extension  4+/5      Palpation   Patella mobility  WFL    Palpation comment  TTP grossly over the left knee      Transfers   Transfers  Independent with all Transfers      Ambulation/Gait   Ambulation/Gait  Yes    Ambulation/Gait Assistance  7: Independent    Gait Pattern  Step-through pattern    Gait Comments  Mildly antalgic on left                Objective measurements completed on examination: See above findings.      [facility] Adult PT Treatment/Exercise - 12/02/19 0001      Exercises   Exercises  Knee/Hip  Knee/Hip Exercises: Stretches   Press photographer  30 seconds    Gastroc Stretch Limitations  standing      Knee/Hip Exercises: Standing   Heel Raises  10 reps    Heel Raises Limitations  3# ankle weight    Knee Flexion  10 reps    Knee Flexion Limitations  hamstring curl, 3#    Hip Flexion  10 reps    Hip Flexion Limitations  marching, 3#    Hip Abduction  10 reps    Abduction Limitations  3#    Hip Extension  10 reps    Extension Limitations   3#      Knee/Hip Exercises: Supine   Short Arc Quad Sets  10 reps    Short Arc Quad Sets Limitations  3#    Straight Leg Raises  10 reps    Straight Leg Raises Limitations  3#             PT Education - 12/02/19 0959    Education Details  Exam findinds, POC, HEP    Person(s) Educated  Patient    Methods  Explanation;Demonstration;Verbal cues;Handout    Comprehension  Verbalized understanding;Returned demonstration;Verbal cues required;Need further instruction       PT Short Term Goals - 12/02/19 1221      PT SHORT TERM GOAL #1   Title  Patient will be I with initial HEP to maintain progress in PT.    Time  4    Period  Weeks    Status  New    Target Date  12/30/19      PT SHORT TERM GOAL #2   Title  Patient will exhibit improved balance of SLS > or = 10 sec in order to exhibit improved knee stability and strength.    Time  4    Period  Weeks    Status  New    Target Date  12/30/19      PT SHORT TERM GOAL #3   Title  Patient will report ability to walk 1 mile with no feeling of instability to progress to PLOF.    Time  4    Period  Weeks    Status  New    Target Date  12/30/19        PT Long Term Goals - 12/02/19 1224      PT LONG TERM GOAL #1   Title  Patient will exhibit improved strength of knee and knee to > or = 4+/5 MMT grossly bilaterall to improve walking ability.    Time  8    Period  Weeks    Status  New    Target Date  01/27/20      PT LONG TERM GOAL #2   Title  Patient will demonstrate improved functional ability by < or = 43% FOTO.    Time  8    Period  Weeks    Status  New    Target Date  01/27/20      PT LONG TERM GOAL #3   Title  Patient will report < or = 0-1/10 pain level with walking >1 mile with no instances of her knee giving out.    Time  8    Period  Weeks    Status  New    Target Date  01/27/20             Plan - 12/02/19 1040    Clinical Impression Statement  Patient presents to physical therapy  with chronic  left knee pain and moments where the knee gives out on her. She has good range of motion but does exhibit some weakness of the knee and hip. She has history of multiple surgeries and knee arthritis which is most likely contributing her her knee giving out and pain. She was provided with exercises to improve her strength and stability, and would benfit from continued skilled PT to progress her knee and hip strength to reduce pain and allow her to return to previous walking and activity level.    Personal Factors and Comorbidities  Time since onset of injury/illness/exacerbation;Past/Current Experience    Examination-Activity Limitations  Locomotion Level;Carry;Dressing;Stairs;Stand;Lift;Squat;Sleep;Sit;Transfers    Examination-Participation Restrictions  Church;Community Activity    Stability/Clinical Decision Making  Stable/Uncomplicated    Clinical Decision Making  Low    Rehab Potential  Good    PT Frequency  1x / week    PT Duration  8 weeks    PT Treatment/Interventions  ADLs/Self Care Home Management;Cryotherapy;Electrical Stimulation;Moist Heat;Neuromuscular re-education;Balance training;Therapeutic exercise;Therapeutic activities;Gait training;Stair training;Patient/family education;Manual techniques;Passive range of motion;Taping;Joint Manipulations    PT Next Visit Plan  Assess HEP, progress knee and hip strengthening, incorporate machine strengthening, balance training    PT Home Exercise Plan  Access Code: [email or web address]: (exercises performed with 2-3 lb ankle weights) SLR, SAQ, standing hip abduction, extension, hamstring curl, heel raises, gastroc stretch    Consulted and Agree with Plan of Care  Patient       Patient will benefit from skilled therapeutic intervention in order to improve the following deficits and impairments:  Abnormal gait, Decreased range of motion, Decreased activity tolerance, Pain, Decreased balance, Decreased strength  Visit Diagnosis: Chronic pain of left  knee  Muscle weakness (generalized)  Other abnormalities of gait and mobility     Problem List Patient Active Problem List   Diagnosis Date Noted  . GAD (generalized anxiety disorder) 09/21/2019  . Anxiety and depression 02/07/2019  . Pain in left wrist 07/24/2017  . Painful wrist, right 07/24/2017  . Osteoporosis 04/21/2017  . Cervical stenosis of spine 04/21/2017  . Vitamin D deficiency 04/21/2017  . Recurrent major depressive disorder, in partial remission ([place]) 04/09/2017  . Chronic low back pain 04/09/2017  . Fibromyalgia     Hilda Blades, PT, DPT, LAT, ATC 12/02/19  12:32 PM Phone: [phone] Fax: [facility] [address] [place], [place], [address] Phone: [phone]   Fax:  [phone]  Name: Betty Holmes MRN: [id number] Date of Birth: [birth year]   PHYSICAL THERAPY DISCHARGE SUMMARY  Visits from Start of Care: 1  Current functional level related to goals / functional outcomes: See above   Remaining deficits: See above   Education / Equipment: HEP Plan: Patient agrees to discharge.  Patient goals were not met. Patient is being discharged due to not returning since the last visit.  ?????    Hilda Blades, PT, DPT, LAT, ATC 03/07/20  2:55 PM Phone: [phone] Fax: [phone]

## 2019-12-02 NOTE — Patient Instructions (Signed)
Access Code: [email or web address]  URL: [email or web address]  Date: 12/02/2019  Prepared by: Hilda Blades   Exercises Straight Leg Raise with Ankle Weight - 10 reps - 2 sets - 1x daily - 7x weekly Short Arc Quad with Ankle Weight - 10 reps - 2 sets - 1x daily - 7x weekly Standing Hip Abduction with Ankle Weight - 10 reps - 2 sets - 1x daily - 7x weekly Standing Hip Extension with Ankle Weight - 10 reps - 2 sets - 1x daily - 7x weekly Standing Knee Flexion with Ankle Weight - 10 reps - 2 sets - 1x daily - 7x weekly Standing Heel Raise with Support - 10 reps - 2 sets - 1x daily - 7x weekly Standing Gastroc Stretch - 2 reps - 30 seconds hold - 1x daily - 7x weekly

## 2019-12-10 ENCOUNTER — Encounter

## 2019-12-14 ENCOUNTER — Ambulatory Visit: Payer: Medicare Other | Admitting: Physical Therapy

## 2019-12-30 ENCOUNTER — Encounter: Payer: Self-pay | Admitting: Physical Therapy

## 2019-12-31 ENCOUNTER — Other Ambulatory Visit: Payer: Medicare Other

## 2020-01-04 ENCOUNTER — Other Ambulatory Visit: Payer: Self-pay

## 2020-01-05 ENCOUNTER — Ambulatory Visit (INDEPENDENT_AMBULATORY_CARE_PROVIDER_SITE_OTHER): Payer: Medicare Other | Admitting: Nurse Practitioner

## 2020-01-05 ENCOUNTER — Encounter: Payer: Self-pay | Admitting: Nurse Practitioner

## 2020-01-05 VITALS — BP 124/80 | HR 62 | Temp 97.0°F | Ht 63.0 in | Wt 159.4 lb

## 2020-01-05 DIAGNOSIS — F332 Major depressive disorder, recurrent severe without psychotic features: Secondary | ICD-10-CM

## 2020-01-05 DIAGNOSIS — B372 Candidiasis of skin and nail: Secondary | ICD-10-CM | POA: Diagnosis not present

## 2020-01-05 DIAGNOSIS — F411 Generalized anxiety disorder: Secondary | ICD-10-CM | POA: Diagnosis not present

## 2020-01-05 MED ORDER — VENLAFAXINE HCL ER 37.5 MG PO CP24: 37.5000 mg | ORAL_CAPSULE | Freq: Every day | ORAL | 5 refills | Status: DC

## 2020-01-05 MED ORDER — NYSTATIN 100000 UNIT/GM EX POWD: 1.0000 "application " | Freq: Two times a day (BID) | CUTANEOUS | 1 refills | Status: DC

## 2020-01-05 MED ORDER — HYDROXYZINE HCL 25 MG PO TABS: 25.0000 mg | ORAL_TABLET | Freq: Every day | ORAL | 2 refills | Status: DC | PRN

## 2020-01-05 MED ORDER — FLUOXETINE HCL 20 MG PO CAPS: 20.0000 mg | ORAL_CAPSULE | Freq: Two times a day (BID) | ORAL | 3 refills | Status: DC

## 2020-01-05 NOTE — Progress Notes (Signed)
Subjective:  Patient ID: Betty Holmes, female    DOB: [birth year]  Age: 64 y.o. MRN: [id number]  CC: Follow-up (eczema med consult/ refill prozac. mycostatin has mineral oil--not able to take that/ FYI--pt is is on meloxicam now for knee pain)  HPI Rash: Subacute, under breast and axilla region, worse with humidity. Associated with itching, no drainage.   Reports waxing and waning mood with fluoxetine. Thinks about death, but has no intention on acting on thoughts. Has no plan. No hx of suicide attempt. Depression screen [facility] 2/9 01/05/2020 09/21/2019 04/01/2017  Decreased Interest 3 2 3   Down, Depressed, Hopeless 3 - 3  PHQ - 2 Score 6 2 6   Altered sleeping 3 3 1   Tired, decreased energy 3 3 3   Change in appetite 3 3 0  Feeling bad or failure about yourself  3 3 0  Trouble concentrating 2 3 3   Moving slowly or fidgety/restless 0 2 0  Suicidal thoughts 2 2 0  PHQ-9 Score 22 21 13    GAD 7 : Generalized Anxiety Score 01/05/2020 09/21/2019  Nervous, Anxious, on Edge 3 3  Control/stop worrying 3 3  Worry too much - different things 3 3  Trouble relaxing 3 3  Restless 1 2  Easily annoyed or irritable 1 3  Afraid - awful might happen 2 3  Total GAD 7 Score 16 20   Reviewed past Medical, Social and Family history today.  Outpatient Medications Prior to Visit  Medication Sig Dispense Refill  . meloxicam (MOBIC) 7.5 MG tablet Take 7.5 mg by mouth daily.    . Vitamin D, Cholecalciferol, 1000 units TABS Take 1 tablet by mouth 2 times daily at 12 noon and 4 pm. 180 tablet 1  . FLUoxetine (PROZAC) 20 MG capsule TAKE 1 CAPSULE BY MOUTH TWICE A DAY 180 capsule 0  . hydrOXYzine (ATARAX/VISTARIL) 25 MG tablet Take 1 tablet (25 mg total) by mouth every 8 (eight) hours as needed for anxiety or itching. 30 tablet 0  . nystatin ointment (MYCOSTATIN) Apply 1 application topically 2 (two) times daily. (Patient not taking: Reported on 01/05/2020) 30 g 0   Facility-Administered Medications Prior  to Visit  Medication Dose Route Frequency Provider Last Rate Last Admin  . betamethasone acetate-betamethasone sodium phosphate (CELESTONE) injection 3 mg  3 mg Intramuscular Once Daylene Katayama M, DPM        ROS See HPI  Objective:  BP 124/80   Pulse 62   Temp (!) 97 F (36.1 C) (Tympanic)   Ht 5\' 3"  (1.6 m)   Wt 159 lb 6.4 oz (72.3 kg)   SpO2 98%   BMI 28.24 kg/m   BP Readings from Last 3 Encounters:  01/05/20 124/80  10/06/19 118/80  09/21/19 (!) 130/92    Wt Readings from Last 3 Encounters:  01/05/20 159 lb 6.4 oz (72.3 kg)  10/06/19 156 lb 12.8 oz (71.1 kg)  09/21/19 157 lb 12.8 oz (71.6 kg)   Physical Exam Vitals reviewed.  Cardiovascular:     Rate and Rhythm: Normal rate and regular rhythm.     Pulses: Normal pulses.     Heart sounds: Normal heart sounds.  Pulmonary:     Effort: Pulmonary effort is normal.     Breath sounds: Normal breath sounds.  Musculoskeletal:     Right lower leg: No edema.     Left lower leg: No edema.  Skin:    Findings: Erythema and rash present. Rash is macular.  Neurological:     Mental Status: She is alert and oriented to person, place, and time.  Psychiatric:        Mood and Affect: Mood normal.        Behavior: Behavior normal.     Lab Results  Component Value Date   WBC 4.0 10/06/2019   HGB 13.4 10/06/2019   HCT 40.0 10/06/2019   PLT 196.0 10/06/2019   GLUCOSE 88 10/06/2019   CHOL 192 10/06/2019   TRIG 67.0 10/06/2019   HDL 68.90 10/06/2019   LDLCALC 109 (H) 10/06/2019   ALT 8 10/06/2019   AST 13 10/06/2019   NA 138 10/06/2019   K 4.1 10/06/2019   CL 103 10/06/2019   CREATININE 0.57 10/06/2019   BUN 11 10/06/2019   CO2 27 10/06/2019   TSH 1.76 10/06/2019    DG Bone Density  Result Date: 12/21/2017 Date of study: 10/19/17 Exam: DUAL X-RAY ABSORPTIOMETRY (DXA) FOR BONE MINERAL DENSITY (BMD) Instrument: [employer] Provider: PCP Indication: follow up for low BMD Comparison: none  (please note that it is not possible to compare data from different instruments) Clinical data: Pt is a 64 y.o. female without previous history of fracture. On calcium and vitamin D. Results:  Lumbar spine L1-L4 Femoral neck (FN) 33% distal radius T-score -2.2 RFN: -1.7 LFN: -2.2 n/a Change in BMD from previous DXA test (%) n/a n/a n/a (*) statistically significant Assessment: the BMD is low according to the [facility] classification for osteoporosis (see below). Fracture risk: moderate FRAX score: 10 year major osteoporotic risk: 10.9%. 10 year hip fracture risk: 1.8%. The thresholds for treatment are 20% and 3%, respectively. Comments: the technical quality of the study is good. Evaluation for secondary causes should be considered if clinically indicated. Recommend optimizing calcium (1200 mg/day) and vitamin D (800 IU/day) intake. Followup: Repeat BMD is appropriate after 2 years or after 1-2 years if starting treatment. WHO criteria for diagnosis of osteoporosis in postmenopausal women and in men 20 y/o or older: - normal: T-score -1.0 to + 1.0 - osteopenia/low bone density: T-score between -2.5 and -1.0 - osteoporosis: T-score below -2.5 - severe osteoporosis: T-score below -2.5 with history of fragility fracture Note: although not part of the WHO classification, the presence of a fragility fracture, regardless of the T-score, should be considered diagnostic of osteoporosis, provided other causes for the fracture have been excluded. Treatment: The National Osteoporosis Foundation recommends that treatment be considered in postmenopausal women and men age 22 or older with: 1. Hip or vertebral (clinical or morphometric) fracture 2. T-score of - 2.5 or lower at the spine or hip 3. 10-year fracture probability by FRAX of at least 20% for a major osteoporotic fracture and 3% for a hip fracture Loura Pardon MD    Assessment & Plan:  This visit occurred during the SARS-CoV-2 public health emergency.  Safety protocols were  in place, including screening questions prior to the visit, additional usage of staff PPE, and extensive cleaning of exam room while observing appropriate contact time as indicated for disinfecting solutions.   Angeni was seen today for follow-up.  Diagnoses and all orders for this visit:  Severe episode of recurrent major depressive disorder, without psychotic features ([place]) -     FLUoxetine (PROZAC) 20 MG capsule; Take 1 capsule (20 mg total) by mouth 2 (two) times daily.  GAD (generalized anxiety disorder) -     hydrOXYzine (ATARAX/VISTARIL) 25 MG tablet; Take 1 tablet (25 mg total) by mouth daily  as needed for anxiety or itching. -     venlafaxine XR (EFFEXOR XR) 37.5 MG 24 hr capsule; Take 1 capsule (37.5 mg total) by mouth daily with breakfast.  Candidal intertrigo -     nystatin (MYCOSTATIN/NYSTOP) powder; Apply 1 application topically 2 (two) times daily.   I have discontinued Roneshia Lubell. Bump's nystatin ointment. I have also changed her hydrOXYzine and FLUoxetine. Additionally, I am having her start on venlafaxine XR and nystatin. Lastly, I am having her maintain her Vitamin D (Cholecalciferol) and meloxicam. We will continue to administer betamethasone acetate-betamethasone sodium phosphate.  Meds ordered this encounter  Medications  . hydrOXYzine (ATARAX/VISTARIL) 25 MG tablet    Sig: Take 1 tablet (25 mg total) by mouth daily as needed for anxiety or itching.    Dispense:  30 tablet    Refill:  2    Order Specific Question:   Supervising Provider    Answer:   Lucille Passy [[id number]]  . venlafaxine XR (EFFEXOR XR) 37.5 MG 24 hr capsule    Sig: Take 1 capsule (37.5 mg total) by mouth daily with breakfast.    Dispense:  30 capsule    Refill:  5    Order Specific Question:   Supervising Provider    Answer:   Lucille Passy [[id number]]  . FLUoxetine (PROZAC) 20 MG capsule    Sig: Take 1 capsule (20 mg total) by mouth 2 (two) times daily.    Dispense:  180 capsule    Refill:  3      Order Specific Question:   Supervising Provider    Answer:   Lucille Passy [[id number]]  . nystatin (MYCOSTATIN/NYSTOP) powder    Sig: Apply 1 application topically 2 (two) times daily.    Dispense:  60 g    Refill:  1    Order Specific Question:   Supervising Provider    Answer:   Lucille Passy [[id number]]    Problem List Items Addressed This Visit      Other   GAD (generalized anxiety disorder)   Relevant Medications   hydrOXYzine (ATARAX/VISTARIL) 25 MG tablet   venlafaxine XR (EFFEXOR XR) 37.5 MG 24 hr capsule   FLUoxetine (PROZAC) 20 MG capsule    Other Visit Diagnoses    Severe episode of recurrent major depressive disorder, without psychotic features ([facility])    -  Primary   Relevant Medications   hydrOXYzine (ATARAX/VISTARIL) 25 MG tablet   venlafaxine XR (EFFEXOR XR) 37.5 MG 24 hr capsule   FLUoxetine (PROZAC) 20 MG capsule   Candidal intertrigo       Relevant Medications   nystatin (MYCOSTATIN/NYSTOP) powder      Follow-up: Return in about 4 weeks (around 02/02/2020) for anxiety and depression (video appt, 24mins).  Wilfred Lacy, NP

## 2020-01-05 NOTE — Assessment & Plan Note (Addendum)
Reports Noncompliance with prozac due to stigma of taking antidepressant. Increased sleeping, binge eating, irritability with husband, resentment towards husband for using her credit card and not paying back and for letting brother in law move into their home. She feel like her husband is watching her because she ran away before. 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 Thinks her husband is too focus on following the rules of their religion. States "I will like to have my own stuff". She also reports she ran away from her home in past (several months) with another. Will like referral to psychology Use wellbutrin and cymbalta in past (did not like side effects.) Denies any abuse at home or as a child.  Reports waxing and waning mood with fluoxetine.  Thinks about death, but has no intention on acting on thoughts. Has no plan. No hx of suicide attempt. She admits to use of marijuana while in [place], but none while in [place]. Denies use of any other illicit drugs. She discontinued prozac 05/2019 thinking it was cause of generalized itching. Itching did not improve with discontinuation of prozac.  Continue prozac Start effexor. Use vistaril prn. F/up in 22month (video)

## 2020-01-05 NOTE — Patient Instructions (Addendum)
Continue prozac Start effexor. Use vistaril prn. F/up in 10month instead on 82months (this is to make sure effexor is effective)  Combination or prozac and meloxicam can increase risk of GI bleed, so make sure medications are taken with food.   Suicidal Feelings: How to Help Yourself Suicide is when you end your own life. There are many things you can do to help yourself feel better when struggling with these feelings. Many services and people are available to support you and others who struggle with similar feelings.  If you ever feel like you may hurt yourself or others, or have thoughts about taking your own life, get help right away. To get help:  Call your local emergency services (911 in the [place]  [facility] [place] [facility]'s health and human services helpline (211 in the [place]  Go to your nearest emergency department.  Call a suicide hotline to speak with a trained counselor. The following suicide hotlines are available in the [place]: ? 1-800-273-TALK 213-460-8175). ? 1-800-SUICIDE 435-520-8866). ? 352-327-3565. This is a hotline for Spanish speakers. ? 2791592949. This is a hotline for TTY users. ? 1-866-4-U-TREVOR 302-378-9008). This is a hotline for lesbian, gay, bisexual, transgender, or questioning youth. ? For a list of hotlines in [place], visit ParkingAffiliatePrograms.se.html  Contact a crisis center or a local suicide prevention center. To find a crisis center or suicide prevention center: ? Call your local hospital, clinic, community service organization, mental health center, social service provider, or health department. Ask for help with connecting to a crisis center. ? For a list of crisis centers in the [place], visit: suicidepreventionlifeline.org ? For a list of crisis centers in [place], visit: suicideprevention.ca How to help yourself feel better   Promise yourself that you will not do anything extreme when  you have suicidal feelings. Remember, there is hope. Many people have gotten through suicidal thoughts and feelings, and you can too. If you have had these feelings before, remind yourself that you can get through them again.  Let family, friends, teachers, or counselors know how you are feeling. Try not to separate yourself from those who care about you and want to help you. Talk with someone every day, even if you do not feel sociable. Face-to-face conversation is best to help them understand your feelings.  Contact a mental health care provider and work with this person regularly.  Make a safety plan that you can follow during a crisis. Include phone numbers of suicide prevention hotlines, mental health professionals, and trusted friends and family members you can call during an emergency. Save these numbers on your phone.  If you are thinking of taking a lot of medicine, give your medicine to someone who can give it to you as prescribed. If you are on antidepressants and are concerned you will overdose, tell your health care provider so that he or she can give you safer medicines.  Try to stick to your routines. Follow a schedule every day. Make self-care a priority.  Make a list of realistic goals, and cross them off when you achieve them. Accomplishments can give you a sense of worth.  Wait until you are feeling better before doing things that you find difficult or unpleasant.  Do things that you have always enjoyed to take your mind off your feelings. Try reading a book, or listening to or playing music. Spending time outside, in nature, may help you feel better. Follow these instructions at home:   Visit your primary health care provider  every year for a checkup.  Work with a mental health care provider as needed.  Eat a well-balanced diet, and eat regular meals.  Get plenty of rest.  Exercise if you are able. Just 30 minutes of exercise each day can help you feel better.  Take  over-the-counter and prescription medicines only as told by your health care provider. Ask your mental health care provider about the possible side effects of any medicines you are taking.  Do not use alcohol or drugs, and remove these substances from your home.  Remove weapons, poisons, knives, and other deadly items from your home. General recommendations  Keep your living space well lit.  When you are feeling well, write yourself a letter with tips and support that you can read when you are not feeling well.  Remember that life's difficulties can be sorted out with help. Conditions can be treated, and you can learn behaviors and ways of thinking that will help you. Where to find more information  National Suicide Prevention Lifeline: www.suicidepreventionlifeline.org  Hopeline: www.hopeline.Presidio for Suicide Prevention: PromotionalLoans.co.za  [employer] (for lesbian, gay, bisexual, transgender, or questioning youth): www.thetrevorproject.org Contact a health care provider if:  You feel as though you are a burden to others.  You feel agitated, angry, vengeful, or have extreme mood swings.  You have withdrawn from family and friends. Get help right away if:  You are talking about suicide or wishing to die.  You start making plans for how to commit suicide.  You feel that you have no reason to live.  You start making plans for putting your affairs in order, saying goodbye, or giving your possessions away.  You feel guilt, shame, or unbearable pain, and it seems like there is no way out.  You are frequently using drugs or alcohol.  You are engaging in risky behaviors that could lead to death. If you have any of these symptoms, get help right away. Call emergency services, go to your nearest emergency department or crisis center, or call a suicide crisis helpline. Summary  Suicide is when you take your own life.  Promise yourself that you will not do  anything extreme when you have suicidal feelings.  Let family, friends, teachers, or counselors know how you are feeling.  Get help right away if you feel as though life is getting too tough to handle and you are thinking about suicide. This information is not intended to replace advice given to you by your health care provider. Make sure you discuss any questions you have with your health care provider. Document Revised: 04/01/2019 Document Reviewed: 07/22/2017 Elsevier Patient Education  [place].

## 2020-01-11 ENCOUNTER — Ambulatory Visit: Payer: Medicare Other | Admitting: Nurse Practitioner

## 2020-01-13 ENCOUNTER — Encounter: Payer: Self-pay | Admitting: Nurse Practitioner

## 2020-01-31 ENCOUNTER — Other Ambulatory Visit: Payer: Self-pay | Admitting: Nurse Practitioner

## 2020-01-31 DIAGNOSIS — F411 Generalized anxiety disorder: Secondary | ICD-10-CM

## 2020-02-14 ENCOUNTER — Ambulatory Visit: Payer: Medicare Other | Admitting: Nurse Practitioner

## 2020-02-23 ENCOUNTER — Other Ambulatory Visit: Payer: Self-pay | Admitting: Nurse Practitioner

## 2020-02-23 DIAGNOSIS — F411 Generalized anxiety disorder: Secondary | ICD-10-CM

## 2020-02-23 NOTE — Telephone Encounter (Signed)
Betty Holmes please advise, last ov was 01/05/2020--rx for 30 tab sent. Do you want to see her first?

## 2020-05-03 ENCOUNTER — Ambulatory Visit: Payer: Medicare Other | Admitting: *Deleted

## 2020-05-03 ENCOUNTER — Telehealth: Payer: Self-pay | Admitting: Nurse Practitioner

## 2020-05-03 NOTE — Telephone Encounter (Signed)
error 

## 2020-05-04 ENCOUNTER — Telehealth: Payer: Self-pay | Admitting: Nurse Practitioner

## 2020-05-04 NOTE — Telephone Encounter (Signed)
Patient dropped off [facility] Preoperative clearance form for [facility] to fill out.

## 2020-05-04 NOTE — Telephone Encounter (Signed)
Forms for pre-operative clearance was received and left on Charlottes desk for review.   Baldo Ash please advise an FYI.

## 2020-05-05 ENCOUNTER — Ambulatory Visit: Payer: Medicare Other | Admitting: Physician Assistant

## 2020-05-05 NOTE — Telephone Encounter (Signed)
Pt was notified and verbally understood.  Pt did make an appointment 05/11/20.

## 2020-05-05 NOTE — Telephone Encounter (Signed)
Last OV was 12/2019 and last labs done 09/2019. She will need to schedule an OV (F2F)

## 2020-05-10 ENCOUNTER — Other Ambulatory Visit: Payer: Self-pay

## 2020-05-11 ENCOUNTER — Encounter: Payer: Self-pay | Admitting: Nurse Practitioner

## 2020-05-11 ENCOUNTER — Ambulatory Visit (INDEPENDENT_AMBULATORY_CARE_PROVIDER_SITE_OTHER): Payer: Medicare Other | Admitting: Nurse Practitioner

## 2020-05-11 VITALS — BP 116/84 | HR 64 | Temp 97.2°F | Ht 63.0 in | Wt 163.2 lb

## 2020-05-11 DIAGNOSIS — Z01818 Encounter for other preprocedural examination: Secondary | ICD-10-CM | POA: Diagnosis not present

## 2020-05-11 DIAGNOSIS — F3341 Major depressive disorder, recurrent, in partial remission: Secondary | ICD-10-CM

## 2020-05-11 NOTE — Assessment & Plan Note (Signed)
Stable mood with prozac daily and vistaril prn.

## 2020-05-11 NOTE — Patient Instructions (Addendum)
Will complete form after review of lab results.  Normal lab results. Pending urinalysis.

## 2020-05-11 NOTE — Progress Notes (Signed)
Subjective:  Patient ID: Betty Holmes, female    DOB: 09/01/1956  Age: 64 y.o. MRN: [id number]  CC: Follow-up (to discuss preop forms and lab work//had first [place] shot due for 2nd 05/13/20//No recent mammogram//no pnuemonia)  HPI Preop clearance for right total knee arthroplasty by [facility] No hx of anesthesia complication No hx of DVT or or PE. Previous C-section and left knee arthroplasty with no complications.  Reviewed past Medical, Social and Family history today.  Outpatient Medications Prior to Visit  Medication Sig Dispense Refill  . FLUoxetine (PROZAC) 20 MG capsule Take 1 capsule (20 mg total) by mouth 2 (two) times daily. 180 capsule 3  . hydrOXYzine (ATARAX/VISTARIL) 25 MG tablet TAKE 1 TABLET (25 MG TOTAL) BY MOUTH DAILY AS NEEDED FOR ANXIETY OR ITCHING. 90 tablet 1  . meloxicam (MOBIC) 7.5 MG tablet Take 7.5 mg by mouth daily.    Marland Kitchen nystatin (MYCOSTATIN/NYSTOP) powder Apply 1 application topically 2 (two) times daily. 60 g 1  . Vitamin D, Cholecalciferol, 1000 units TABS Take 1 tablet by mouth 2 times daily at 12 noon and 4 pm. 180 tablet 1  . acetaminophen (TYLENOL) 325 MG tablet Take 975 mg by mouth 3 (three) times daily.    . diclofenac Sodium (VOLTAREN) 1 % GEL APPLY TO AFFECTED AREA TWICE A DAY FOR 7 DAYS    . venlafaxine XR (EFFEXOR XR) 37.5 MG 24 hr capsule Take 1 capsule (37.5 mg total) by mouth daily with breakfast. (Patient not taking: Reported on 05/11/2020) 30 capsule 5   Facility-Administered Medications Prior to Visit  Medication Dose Route Frequency Provider Last Rate Last Admin  . betamethasone acetate-betamethasone sodium phosphate (CELESTONE) injection 3 mg  3 mg Intramuscular Once Edrick Kins, DPM        ROS See HPI  Objective:  BP 116/84   Pulse 64   Temp (!) 97.2 F (36.2 C) (Tympanic)   Ht 5\' 3"  (1.6 m)   Wt 163 lb 3.2 oz (74 kg)   SpO2 97%   BMI 28.91 kg/m   BP Readings from Last 3 Encounters:  05/11/20 116/84  01/05/20  124/80  10/06/19 118/80    Wt Readings from Last 3 Encounters:  05/11/20 163 lb 3.2 oz (74 kg)  01/05/20 159 lb 6.4 oz (72.3 kg)  10/06/19 156 lb 12.8 oz (71.1 kg)    Physical Exam Constitutional:      Appearance: She is obese.  Cardiovascular:     Rate and Rhythm: Normal rate and regular rhythm.     Pulses: Normal pulses.     Heart sounds: Normal heart sounds.  Pulmonary:     Effort: Pulmonary effort is normal.     Breath sounds: Normal breath sounds.  Musculoskeletal:     Right lower leg: No edema.     Left lower leg: No edema.  Lymphadenopathy:     Cervical: No cervical adenopathy.  Neurological:     Mental Status: She is alert and oriented to person, place, and time.  Psychiatric:        Mood and Affect: Mood normal.        Behavior: Behavior normal.        Thought Content: Thought content normal.    Lab Results  Component Value Date   WBC 4.0 10/06/2019   HGB 13.4 10/06/2019   HCT 40.0 10/06/2019   PLT 196.0 10/06/2019   GLUCOSE 88 10/06/2019   CHOL 192 10/06/2019   TRIG 67.0 10/06/2019   HDL  68.90 10/06/2019   LDLCALC 109 (H) 10/06/2019   ALT 8 10/06/2019   AST 13 10/06/2019   NA 138 10/06/2019   K 4.1 10/06/2019   CL 103 10/06/2019   CREATININE 0.57 10/06/2019   BUN 11 10/06/2019   CO2 27 10/06/2019   TSH 1.76 10/06/2019    Assessment & Plan:  This visit occurred during the SARS-CoV-2 public health emergency.  Safety protocols were in place, including screening questions prior to the visit, additional usage of staff PPE, and extensive cleaning of exam room while observing appropriate contact time as indicated for disinfecting solutions.   Remel was seen today for follow-up.  Diagnoses and all orders for this visit:  Preoperative clearance -     EKG 12-Lead -     CBC w/Diff -     Comprehensive metabolic panel -     Hemoglobin A1c -     Urinalysis; Future  Recurrent major depressive disorder, in partial remission ([place])   I am having  Betty Holmes maintain her Vitamin D (Cholecalciferol), meloxicam, venlafaxine XR, FLUoxetine, nystatin, hydrOXYzine, acetaminophen, and diclofenac Sodium. We will continue to administer betamethasone acetate-betamethasone sodium phosphate.  No orders of the defined types were placed in this encounter.   Problem List Items Addressed This Visit      Other   Recurrent major depressive disorder, in partial remission ([place])    Stable mood with prozac daily and vistaril prn.       Other Visit Diagnoses    Preoperative clearance    -  Primary   Relevant Orders   EKG 12-Lead (Completed)   CBC w/Diff   Comprehensive metabolic panel   Hemoglobin A1c   Urinalysis      Follow-up: No follow-ups on file.  Wilfred Lacy, NP

## 2020-05-12 ENCOUNTER — Encounter: Payer: Self-pay | Admitting: Nurse Practitioner

## 2020-05-12 ENCOUNTER — Ambulatory Visit: Payer: Medicare Other | Admitting: Physician Assistant

## 2020-05-12 LAB — CBC WITH DIFFERENTIAL/PLATELET
Basophils Absolute: 0 10*3/uL (ref 0.0–0.1)
Basophils Relative: 0.4 % (ref 0.0–3.0)
Eosinophils Absolute: 0.2 10*3/uL (ref 0.0–0.7)
Eosinophils Relative: 2.8 % (ref 0.0–5.0)
HCT: 44 % (ref 36.0–46.0)
Hemoglobin: 14.5 g/dL (ref 12.0–15.0)
Lymphocytes Relative: 29.9 % (ref 12.0–46.0)
Lymphs Abs: 1.7 10*3/uL (ref 0.7–4.0)
MCHC: 33 g/dL (ref 30.0–36.0)
MCV: 89.7 fl (ref 78.0–100.0)
Monocytes Absolute: 0.3 10*3/uL (ref 0.1–1.0)
Monocytes Relative: 5.4 % (ref 3.0–12.0)
Neutro Abs: 3.4 10*3/uL (ref 1.4–7.7)
Neutrophils Relative %: 61.5 % (ref 43.0–77.0)
Platelets: 144 10*3/uL — ABNORMAL LOW (ref 150.0–400.0)
RBC: 4.9 Mil/uL (ref 3.87–5.11)
RDW: 13.1 % (ref 11.5–15.5)
WBC: 5.6 10*3/uL (ref 4.0–10.5)

## 2020-05-12 LAB — COMPREHENSIVE METABOLIC PANEL
ALT: 9 U/L (ref 0–35)
AST: 18 U/L (ref 0–37)
Albumin: 4.4 g/dL (ref 3.5–5.2)
Alkaline Phosphatase: 41 U/L (ref 39–117)
BUN: 23 mg/dL (ref 6–23)
CO2: 30 mEq/L (ref 19–32)
Calcium: 9.8 mg/dL (ref 8.4–10.5)
Chloride: 103 mEq/L (ref 96–112)
Creatinine, Ser: 0.62 mg/dL (ref 0.40–1.20)
GFR: 96.43 mL/min (ref >60.00)
Glucose, Bld: 94 mg/dL (ref 70–99)
Potassium: 5 mEq/L (ref 3.5–5.1)
Sodium: 135 mEq/L (ref 135–145)
Total Bilirubin: 0.4 mg/dL (ref 0.2–1.2)
Total Protein: 6.9 g/dL (ref 6.0–8.3)

## 2020-05-12 LAB — HEMOGLOBIN A1C: Hgb A1c MFr Bld: 5.4 % (ref 4.6–6.5)

## 2020-05-15 ENCOUNTER — Telehealth: Payer: Self-pay | Admitting: Nurse Practitioner

## 2020-05-15 NOTE — Telephone Encounter (Signed)
Patient is calling and stated that she dropped off paperwork and wanted to see if it was completed and was faxed back. CB is [phone].

## 2020-05-16 ENCOUNTER — Telehealth: Payer: Self-pay | Admitting: Nurse Practitioner

## 2020-05-16 ENCOUNTER — Other Ambulatory Visit: Payer: Self-pay

## 2020-05-16 ENCOUNTER — Other Ambulatory Visit (INDEPENDENT_AMBULATORY_CARE_PROVIDER_SITE_OTHER): Payer: Medicare Other

## 2020-05-16 ENCOUNTER — Encounter: Payer: Self-pay | Admitting: Nurse Practitioner

## 2020-05-16 DIAGNOSIS — Z01818 Encounter for other preprocedural examination: Secondary | ICD-10-CM

## 2020-05-16 DIAGNOSIS — N3000 Acute cystitis without hematuria: Secondary | ICD-10-CM

## 2020-05-16 LAB — URINALYSIS, ROUTINE W REFLEX MICROSCOPIC
Bilirubin Urine: NEGATIVE
Hgb urine dipstick: NEGATIVE
Nitrite: POSITIVE — AB
RBC / HPF: NONE SEEN (ref 0–?)
Specific Gravity, Urine: 1.025 (ref 1.000–1.030)
Total Protein, Urine: NEGATIVE
Urine Glucose: NEGATIVE
Urobilinogen, UA: 0.2 (ref 0.0–1.0)
pH: 5.5 (ref 5.0–8.0)

## 2020-05-16 NOTE — Telephone Encounter (Signed)
[name] please advise.  Pt asking for Acetaminophen Rx. Never been prescribed by you.  Last OV-05/11/20

## 2020-05-16 NOTE — Telephone Encounter (Signed)
Patient was in [place] last month and started having knee pain. She got prescription for Acetaminophen. She would like a refill here sent to [facility] on [address]

## 2020-05-16 NOTE — Addendum Note (Signed)
Addended by: Lynnea Ferrier on: 05/16/2020 03:04 PM   Modules accepted: Orders

## 2020-05-16 NOTE — Telephone Encounter (Signed)
Patient is calling back to check the status of previous message. CB is [phone]

## 2020-05-17 NOTE — Telephone Encounter (Signed)
Fax form with lab results if already signed

## 2020-05-17 NOTE — Telephone Encounter (Signed)
[name] please advise.  Pt called to check the status of her preoperative clearance form an was wondering if we could complete it since her urinalysis was back even tho it was abnormal. I have these forms-FYI

## 2020-05-17 NOTE — Telephone Encounter (Signed)
That is an OTC medication. No rx needed

## 2020-05-18 ENCOUNTER — Encounter: Payer: Self-pay | Admitting: Nurse Practitioner

## 2020-05-18 NOTE — Telephone Encounter (Signed)
Left a voicemail for the patient and sent her a My Chart message to inform her that she doesn't need a prescription for the Acetaminophen.

## 2020-05-18 NOTE — Telephone Encounter (Signed)
[place] signature required. I put this on your desk.

## 2020-05-18 NOTE — Telephone Encounter (Signed)
Forms and labs were faxed.

## 2020-05-18 NOTE — Telephone Encounter (Signed)
Form signed.

## 2020-05-19 LAB — URINE CULTURE
MICRO NUMBER:: 10517158
SPECIMEN QUALITY:: ADEQUATE

## 2020-05-19 MED ORDER — SULFAMETHOXAZOLE-TRIMETHOPRIM 800-160 MG PO TABS: 1.0000 | ORAL_TABLET | Freq: Two times a day (BID) | ORAL | 0 refills | Status: DC

## 2020-05-19 NOTE — Addendum Note (Signed)
Addended by: Wilfred Lacy L on: 05/19/2020 12:21 PM   Modules accepted: Orders

## 2020-05-30 NOTE — Telephone Encounter (Signed)
Patient called back and stated that her paperwork for surgery still hasn't been received. Pls advise. CB is [phone]

## 2020-05-31 ENCOUNTER — Encounter (INDEPENDENT_AMBULATORY_CARE_PROVIDER_SITE_OTHER): Payer: Self-pay

## 2020-05-31 ENCOUNTER — Ambulatory Visit: Payer: Medicare Other | Admitting: *Deleted

## 2020-05-31 NOTE — Telephone Encounter (Signed)
Pt was notified and she said that she was informed the paperwork was found but not the form but they said they got her lab results. Pt said they were having a hard time finding the form but she thinks everything is fine and she was given a surgery date. Pt said she would call back or they would call us if there is any issues.

## 2020-06-15 ENCOUNTER — Telehealth: Payer: Self-pay

## 2020-06-15 NOTE — Telephone Encounter (Deleted)
Betty Holmes [phone] ext [phone], The surgery center in [place] is calling and said based on the way the EKG was scanned in they cant read and asked if we could fax it back over to [phone]

## 2020-06-15 NOTE — Telephone Encounter (Signed)
Betty Holmes [phone], The surgery center in [place] is calling and said based on the way the EKG was scanned in they cant read and asked if we could fax it back over to [phone]

## 2020-06-15 NOTE — Telephone Encounter (Signed)
EKG was faxed over to [facility] surgery center at [phone].

## 2020-06-22 ENCOUNTER — Other Ambulatory Visit: Payer: Self-pay

## 2020-06-22 ENCOUNTER — Encounter: Payer: Self-pay | Admitting: Physical Therapy

## 2020-06-22 ENCOUNTER — Ambulatory Visit: Payer: Medicare Other | Attending: Specialist | Admitting: Physical Therapy

## 2020-06-22 DIAGNOSIS — M6281 Muscle weakness (generalized): Secondary | ICD-10-CM | POA: Diagnosis present

## 2020-06-22 DIAGNOSIS — R2689 Other abnormalities of gait and mobility: Secondary | ICD-10-CM | POA: Diagnosis present

## 2020-06-22 DIAGNOSIS — M25562 Pain in left knee: Secondary | ICD-10-CM | POA: Insufficient documentation

## 2020-06-22 DIAGNOSIS — M25662 Stiffness of left knee, not elsewhere classified: Secondary | ICD-10-CM

## 2020-06-22 DIAGNOSIS — R600 Localized edema: Secondary | ICD-10-CM

## 2020-06-22 DIAGNOSIS — G8929 Other chronic pain: Secondary | ICD-10-CM

## 2020-06-22 NOTE — Therapy (Signed)
[place], [place], [address] Phone: [phone]   Fax:  [phone]  Physical Therapy Evaluation  Patient Details  Name: Betty Holmes MRN: [id number] Date of Birth: [birth year] Referring Provider (PT): Sydnee Cabal, MD   Encounter Date: 06/22/2020   PT End of Session - 06/22/20 0932    Visit Number 1    Number of Visits 16    Date for PT Re-Evaluation 08/17/20    Authorization Type UHC MCR    PT Start Time 0930    PT Stop Time 1000    PT Time Calculation (min) 30 min    Activity Tolerance Patient tolerated treatment well    Behavior During Therapy [facility] for tasks assessed/performed           Past Medical History:  Diagnosis Date  . Anxiety   . Arthritis   . At high risk for tick borne illness    had abx--test her again--she was okey---at [facility]  . Depression   . Fibromyalgia   . Fibromyalgia     Past Surgical History:  Procedure Laterality Date  . CESAREAN SECTION    . CYST EXCISION Right 05/09/2017   Procedure: EXCISION RIGHT INGUINAL CYST;  Surgeon: Clovis Riley, MD;  Location: [place];  Service: General;  Laterality: Right;  . KNEE ARTHROSCOPY    . KNEE SURGERY    . OVARIAN CYST REMOVAL      There were no vitals filed for this visit.    Subjective Assessment - 06/22/20 0933    Subjective Patient had left TKA on 06/20/2020. She felt better yesterday but this morning she woke up and was having more pain because she was walking a lot yesterday. She is currently walking using RW. She is performing exercises at home given to her at hospital.    Limitations Sitting;Standing;House hold activities;Walking;Lifting    How long can you sit comfortably? 30 minutes    How long can you stand comfortably? < 5 minutes    How long can you walk comfortably? < 5 minutes    Patient Stated Goals Get back to prior level of function    Currently in Pain? Yes      Pain Score 5     Pain Location Knee    Pain Orientation Left    Pain Descriptors / Indicators Aching;Tightness;Sore    Pain Type Surgical pain    Pain Onset In the past 7 days    Pain Frequency Constant    Aggravating Factors  Standing, walking, bending, sitting too long, lifting the leg    Pain Relieving Factors Rest, medication, ice, elevation    Effect of Pain on Daily Activities Patient limited with all standing and walking activities              [facility] PT Assessment - 06/22/20 0001      Assessment   Medical Diagnosis Left TKA    Referring Provider (PT) Sydnee Cabal, MD    Onset Date/Surgical Date 06/20/20    Hand Dominance Right    Next MD Visit 2 weeks post-op    Prior Therapy Yes      Precautions   Precautions Fall    Precaution Comments Patient using walker following surgery      Restrictions   Weight Bearing Restrictions No      Balance Screen   Has the patient fallen in the past 6 months No    Has the patient  had a decrease in activity level because of a fear of falling?  No    Is the patient reluctant to leave their home because of a fear of falling?  No      Home Environment   Living Environment Private residence      Prior Function   Level of Independence Independent with basic ADLs;Independent with household mobility with device;Independent with community mobility with device    Leisure None reported      Cognition   Overall Cognitive Status Within Functional Limits for tasks assessed      Observation/Other Assessments   Observations Patient appears in no apparent distress, no signs/symptoms of DVT    Focus on Therapeutic Outcomes (FOTO)  79% limitation      Observation/Other Assessments-Edema    Edema --   Patient exhibits greater edema of left knee     Sensation   Light Touch Appears Intact      Coordination   Gross Motor Movements are Fluid and Coordinated Yes      Functional Tests   Functional tests Sit to Stand;Single leg stance       Single Leg Stance   Comments Unable to perform on left      Sit to Stand   Comments Patient required to use BUE for assist, left knee remains extended and she relies on RLE      ROM / Strength   AROM / PROM / Strength AROM;Strength      AROM   AROM Assessment Site Knee    Right/Left Knee Left    Left Knee Extension -5    Left Knee Flexion 70      Strength   Strength Assessment Site Hip;Knee    Right/Left Hip Right;Left    Right Hip Flexion 4/5    Right Hip Extension 4-/5    Right Hip ABduction 3+/5    Left Hip Flexion 3/5    Left Hip Extension 3-/5    Left Hip ABduction 3-/5    Right/Left Knee Right;Left    Right Knee Flexion 4+/5    Right Knee Extension 4+/5    Left Knee Flexion 4-/5    Left Knee Extension 3-/5   good quad contraction     Palpation   Patella mobility Hypomobile all directions    Palpation comment Patient reports generalized post-op tenderness       Special Tests   Other special tests None performed      Ambulation/Gait   Ambulation/Gait Yes    Ambulation/Gait Assistance 6: Modified independent (Device/Increase time)    Assistive device Rolling walker    Gait Comments Decreased gait speed, antalgic on left, decreased left knee flexion                      Objective measurements completed on examination: See above findings.       [facility] Adult PT Treatment/Exercise - 06/22/20 0001      Exercises   Exercises Knee/Hip      Knee/Hip Exercises: Stretches   Other Knee/Hip Stretches Supine calf stretch with strap 2x20 seconds      Knee/Hip Exercises: Seated   Long Arc Quad 10 reps    Long Arc Quad Limitations used contralateral limb for assistance as needed    Heel Slides 10 reps    Heel Slides Limitations knee flexion/extension AROM    Other Seated Knee/Hip Exercises Heel/toe raises x20      Knee/Hip Exercises: Supine   Quad Sets 10  reps   5 seconds   Heel Slides 5 reps    Heel Slides Limitations 5 sec hold with strap     Straight Leg Raises 5 reps    Straight Leg Raises Limitations mild extension lag    Other Supine Knee/Hip Exercises Hip abduction heel slide x10                  PT Education - 06/22/20 0931    Education Details Exam findings, POC, HEP, RICE, signs/symptoms of DVT    Person(s) Educated Patient    Methods Explanation;Demonstration;Tactile cues;Verbal cues;Handout    Comprehension Verbalized understanding;Returned demonstration;Verbal cues required;Tactile cues required;Need further instruction            PT Short Term Goals - 06/22/20 1513      PT SHORT TERM GOAL #1   Title Patient will be I with initial HEP to progress in PT.    Time 4    Period Weeks    Status New    Target Date 07/20/20      PT SHORT TERM GOAL #2   Title Patient will be able to ambulate household distances with no AD to improve mobility    Time 4    Period Weeks    Status New    Target Date 07/20/20      PT SHORT TERM GOAL #3   Title Patient will exhibit improved knee active motion to 0-100 deg to improve gait mechanics    Time 4    Period Weeks    Status New    Target Date 07/20/20      PT SHORT TERM GOAL #4   Title Patient will be able to perform SLR without extension lag and sit<>stand without UE support to indicate improved strength    Time 4    Period Weeks    Status New    Target Date 07/20/20      PT SHORT TERM GOAL #5   Title Patient will report </= 4/10 pain level with activity    Time 4    Period Weeks    Status New    Target Date 07/20/20             PT Long Term Goals - 06/22/20 1517      PT LONG TERM GOAL #1   Title Patient will be I with final HEP to maintain progress from PT    Time 8    Period Weeks    Status New    Target Date 08/17/20      PT LONG TERM GOAL #2   Title Patient will exhibit knee AROM >/= 0-120 deg to allow for return to hiking activities    Time 8    Period Weeks    Status New    Target Date 08/17/20      PT LONG TERM GOAL #3   Title  Patient will exhibit improved knee strength to >/= 4+/5 MMT and hip strength >/= 4/5 MMT to be able to walk 1 mile with no limitation    Time 8    Period Weeks    Status New    Target Date 08/17/20      PT LONG TERM GOAL #4   Title Patient will be able perform SLS >/= 20 sec on left to indicate improved balance and stability    Time 8    Period Weeks    Status New    Target Date 08/17/20  PT LONG TERM GOAL #5   Title Patient will report improved functional level of </= 59% limitation on FOTO    Time 8    Period Weeks    Status New    Target Date 08/17/20                  Plan - 06/22/20 1506    Clinical Impression Statement Patient presents to PT following left TKA on 06/20/2020. She currently demonstrates limitation in left knee motion, strength deficit of the left knee, gait and movement deviations, left leg swelling, balance deficit on left, and increased pain with activity. She would benefit from continued skilled PT to address her listed impairments in order to return to walking and previous level of activity.    Personal Factors and Comorbidities Past/Current Experience;Fitness    Examination-Activity Limitations Locomotion Level;Transfers;Sleep;Squat;Stairs;Stand;Lift;Dressing;Bed Mobility;Sit    Examination-Participation Restrictions Meal Prep;Cleaning;Community Activity;Driving;Shop;Laundry;Yard Work    Stability/Clinical Decision Making Stable/Uncomplicated    Clinical Decision Making Low    Rehab Potential Good    PT Frequency 2x / week    PT Duration 8 weeks    PT Treatment/Interventions ADLs/Self Care Home Management;Cryotherapy;Electrical Stimulation;Iontophoresis 4mg /ml Dexamethasone;Moist Heat;Neuromuscular re-education;Balance training;Therapeutic exercise;Therapeutic activities;Functional mobility training;Stair training;Gait training;Patient/family education;Manual techniques;Dry needling;Passive range of motion;Taping;Vasopneumatic Device;Joint  Manipulations    PT Next Visit Plan Assess HEP and progress PRN, NuStep, manual and stretching to improve knee motion, progress quad strengthening, gait training, vaso    PT Home Exercise Plan [id number]: quad set, heel slide with strap, supine hip abduction, supine calf stretch with strap, SLR, LAQ with assist as needed, seated heel slide, seated heel-toe raises, elevation and ice throughout day    Consulted and Agree with Plan of Care Patient           Patient will benefit from skilled therapeutic intervention in order to improve the following deficits and impairments:  Abnormal gait, Decreased range of motion, Difficulty walking, Pain, Decreased activity tolerance, Decreased balance, Decreased strength, Increased edema  Visit Diagnosis: Chronic pain of left knee  Stiffness of left knee, not elsewhere classified  Muscle weakness (generalized)  Other abnormalities of gait and mobility  Localized edema     Problem List Patient Active Problem List   Diagnosis Date Noted  . GAD (generalized anxiety disorder) 09/21/2019  . Pain in left wrist 07/24/2017  . Painful wrist, right 07/24/2017  . Osteoporosis 04/21/2017  . Cervical stenosis of spine 04/21/2017  . Vitamin D deficiency 04/21/2017  . Recurrent major depressive disorder, in partial remission ([place]) 04/09/2017  . Chronic low back pain 04/09/2017  . Fibromyalgia     Hilda Blades, PT, DPT, LAT, ATC 06/22/20  3:25 PM Phone: [phone] Fax: [facility] [address] [place], [place], [address] Phone: [phone]   Fax:  [phone]  Name: Betty Holmes MRN: [id number] Date of Birth: 1956-12-10

## 2020-06-22 NOTE — Patient Instructions (Signed)
Access Code: [email or web address] URL: [email or web address] Date: 06/22/2020 Prepared by: Hilda Blades  Exercises Supine Quad Set - 5-6 x daily - 7 x weekly - 10 reps - 5 seconds hold Active Straight Leg Raise with Quad Set - 3 x daily - 7 x weekly - 5 reps Long Sitting Calf Stretch with Strap - 3 x daily - 7 x weekly - 2 reps - 30 seconds hold Supine Heel Slide with Strap - 3-4 x daily - 7 x weekly - 5 reps - 5 seconds hold Supine Hip Abduction - 3 x daily - 7 x weekly - 10 reps Seated Knee Flexion Extension AROM - 3-4 x daily - 7 x weekly - 10 reps - 5 seconds hold Seated Heel Toe Raises - 3 x daily - 7 x weekly - 20 reps Seated Knee Extension AAROM - 3 x daily - 7 x weekly - 5-10 reps

## 2020-06-27 ENCOUNTER — Encounter: Payer: Self-pay | Admitting: Physical Therapy

## 2020-06-27 ENCOUNTER — Ambulatory Visit: Payer: Medicare Other | Admitting: Physical Therapy

## 2020-06-27 ENCOUNTER — Other Ambulatory Visit: Payer: Self-pay

## 2020-06-27 DIAGNOSIS — M25662 Stiffness of left knee, not elsewhere classified: Secondary | ICD-10-CM

## 2020-06-27 DIAGNOSIS — G8929 Other chronic pain: Secondary | ICD-10-CM

## 2020-06-27 DIAGNOSIS — M25562 Pain in left knee: Secondary | ICD-10-CM

## 2020-06-27 DIAGNOSIS — R600 Localized edema: Secondary | ICD-10-CM

## 2020-06-27 DIAGNOSIS — R2689 Other abnormalities of gait and mobility: Secondary | ICD-10-CM

## 2020-06-27 DIAGNOSIS — M6281 Muscle weakness (generalized): Secondary | ICD-10-CM

## 2020-06-27 NOTE — Therapy (Signed)
[place], [place], [address] Phone: [phone]   Fax:  [phone]  Physical Therapy Treatment  Patient Details  Name: Betty Holmes MRN: [id number] Date of Birth: 1956-08-26 Referring Provider (PT): Sydnee Cabal, MD   Encounter Date: 06/27/2020   PT End of Session - 06/27/20 1056    Visit Number 2    Number of Visits 16    Date for PT Re-Evaluation 08/17/20    Authorization Type UHC MCR    PT Start Time 1048    PT Stop Time 1145    PT Time Calculation (min) 57 min    Activity Tolerance Patient tolerated treatment well    Behavior During Therapy [facility] for tasks assessed/performed           Past Medical History:  Diagnosis Date  . Anxiety   . Arthritis   . At high risk for tick borne illness    had abx--test her again--she was okey---at [facility]  . Depression   . Fibromyalgia   . Fibromyalgia     Past Surgical History:  Procedure Laterality Date  . CESAREAN SECTION    . CYST EXCISION Right 05/09/2017   Procedure: EXCISION RIGHT INGUINAL CYST;  Surgeon: Clovis Riley, MD;  Location: [place];  Service: General;  Laterality: Right;  . KNEE ARTHROSCOPY    . KNEE SURGERY    . OVARIAN CYST REMOVAL      There were no vitals filed for this visit.   Subjective Assessment - 06/27/20 1053    Subjective Patient reports she had a lot of pain yesterday and had to call her doctor. She also was having a lot of swelling so she has started elevating her leg better.    Patient Stated Goals Get back to prior level of function    Currently in Pain? Yes    Pain Score 7     Pain Location Knee    Pain Orientation Left    Pain Descriptors / Indicators Aching;Tightness;Sharp;Sore;Burning;Throbbing    Pain Type Surgical pain    Pain Onset 1 to 4 weeks ago    Pain Frequency Constant              [facility] PT Assessment - 06/27/20 0001      Assessment   Medical  Diagnosis Left TKA    Onset Date/Surgical Date 06/20/20      AROM   Left Knee Extension -5    Left Knee Flexion 70                         [facility] Adult PT Treatment/Exercise - 06/27/20 0001      Exercises   Exercises Knee/Hip      Knee/Hip Exercises: Stretches   Other Knee/Hip Stretches Supine calf stretch with strap 2x20 seconds      Knee/Hip Exercises: Aerobic   Nustep L3 x 5 min with UE and LE      Knee/Hip Exercises: Seated   Heel Slides 2 sets;5 reps    Heel Slides Limitations knee flexion/extension AAROM      Knee/Hip Exercises: Supine   Quad Sets 2 sets;10 reps   5 sec hold   Heel Slides 2 sets;5 reps    Heel Slides Limitations 10 sec hold with manual assist    Straight Leg Raises 2 sets;5 reps      Modalities   Modalities Vasopneumatic      Vasopneumatic  Number Minutes Vasopneumatic  15 minutes    Vasopnuematic Location  Knee    Vasopneumatic Pressure Medium    Vasopneumatic Temperature  42      Manual Therapy   Manual Therapy Joint mobilization;Passive ROM    Joint Mobilization Gentle patellar and knee mobs to tolerance in supine    Passive ROM Knee extension and flexion to tolerance in supine                  PT Education - 06/27/20 1056    Education Details HEP, RICE    Person(s) Educated Patient    Methods Explanation;Demonstration;Tactile cues;Verbal cues;Handout    Comprehension Verbalized understanding;Returned demonstration;Verbal cues required;Tactile cues required;Need further instruction            PT Short Term Goals - 06/22/20 1513      PT SHORT TERM GOAL #1   Title Patient will be I with initial HEP to progress in PT.    Time 4    Period Weeks    Status New    Target Date 07/20/20      PT SHORT TERM GOAL #2   Title Patient will be able to ambulate household distances with no AD to improve mobility    Time 4    Period Weeks    Status New    Target Date 07/20/20      PT SHORT TERM GOAL #3   Title  Patient will exhibit improved knee active motion to 0-100 deg to improve gait mechanics    Time 4    Period Weeks    Status New    Target Date 07/20/20      PT SHORT TERM GOAL #4   Title Patient will be able to perform SLR without extension lag and sit<>stand without UE support to indicate improved strength    Time 4    Period Weeks    Status New    Target Date 07/20/20      PT SHORT TERM GOAL #5   Title Patient will report </= 4/10 pain level with activity    Time 4    Period Weeks    Status New    Target Date 07/20/20             PT Long Term Goals - 06/22/20 1517      PT LONG TERM GOAL #1   Title Patient will be I with final HEP to maintain progress from PT    Time 8    Period Weeks    Status New    Target Date 08/17/20      PT LONG TERM GOAL #2   Title Patient will exhibit knee AROM >/= 0-120 deg to allow for return to hiking activities    Time 8    Period Weeks    Status New    Target Date 08/17/20      PT LONG TERM GOAL #3   Title Patient will exhibit improved knee strength to >/= 4+/5 MMT and hip strength >/= 4/5 MMT to be able to walk 1 mile with no limitation    Time 8    Period Weeks    Status New    Target Date 08/17/20      PT LONG TERM GOAL #4   Title Patient will be able perform SLS >/= 20 sec on left to indicate improved balance and stability    Time 8    Period Weeks    Status New  Target Date 08/17/20      PT LONG TERM GOAL #5   Title Patient will report improved functional level of </= 59% limitation on FOTO    Time 8    Period Weeks    Status New    Target Date 08/17/20                 Plan - 06/27/20 1056    Clinical Impression Statement Patient tolerated therapy well with no adverse effects. She did exhibit increased lower leg swelling this visit and she was instructed on properly elevating her leg to keep knee above her heart, performing ankle pumps, and icing more often during the day. She couldn't tolerate much manual  therapy or stretching this visit due to pain. She was encouraged to continue working on range of motion at home and elevating her leg anytime she is not performing exercises or up. She would benefit from continued skilled PT to improve her motion and strength in order to return to walking and previous level of activity.    PT Treatment/Interventions ADLs/Self Care Home Management;Cryotherapy;Electrical Stimulation;Iontophoresis 4mg /ml Dexamethasone;Moist Heat;Neuromuscular re-education;Balance training;Therapeutic exercise;Therapeutic activities;Functional mobility training;Stair training;Gait training;Patient/family education;Manual techniques;Dry needling;Passive range of motion;Taping;Vasopneumatic Device;Joint Manipulations    PT Next Visit Plan Assess HEP and progress PRN, NuStep, manual and stretching to improve knee motion, progress quad strengthening, gait training, vaso    PT Home Exercise Plan [id number]: quad set, heel slide with strap, supine hip abduction, supine calf stretch with strap, SLR, LAQ with assist as needed, seated heel slide, seated heel-toe raises, elevation and ice throughout day    Consulted and Agree with Plan of Care Patient           Patient will benefit from skilled therapeutic intervention in order to improve the following deficits and impairments:  Abnormal gait, Decreased range of motion, Difficulty walking, Pain, Decreased activity tolerance, Decreased balance, Decreased strength, Increased edema  Visit Diagnosis: Chronic pain of left knee  Stiffness of left knee, not elsewhere classified  Muscle weakness (generalized)  Other abnormalities of gait and mobility  Localized edema     Problem List Patient Active Problem List   Diagnosis Date Noted  . GAD (generalized anxiety disorder) 09/21/2019  . Pain in left wrist 07/24/2017  . Painful wrist, right 07/24/2017  . Osteoporosis 04/21/2017  . Cervical stenosis of spine 04/21/2017  . Vitamin D deficiency  04/21/2017  . Recurrent major depressive disorder, in partial remission ([place]) 04/09/2017  . Chronic low back pain 04/09/2017  . Fibromyalgia     Hilda Blades, PT, DPT, LAT, ATC 06/27/20  12:40 PM Phone: [phone] Fax: [facility] [address] [place], [place], [address] Phone: [phone]   Fax:  [phone]  Name: JULIUS BONIFACE MRN: [id number] Date of Birth: 1956/09/28

## 2020-06-27 NOTE — Patient Instructions (Signed)
Access Code: [email or web address] URL: [email or web address] Date: 06/27/2020 Prepared by: Hilda Blades  Exercises Supine Quad Set - 5-6 x daily - 7 x weekly - 10 reps - 5 seconds hold Active Straight Leg Raise with Quad Set - 3 x daily - 7 x weekly - 5 reps Long Sitting Calf Stretch with Strap - 3 x daily - 7 x weekly - 2 reps - 30 seconds hold Supine Heel Slide with Strap - 3-4 x daily - 7 x weekly - 5 reps - 5 seconds hold Supine Hip Abduction - 3 x daily - 7 x weekly - 10 reps Seated Knee Flexion Extension AROM - 3-4 x daily - 7 x weekly - 10 reps - 5 seconds hold Seated Heel Toe Raises - 3 x daily - 7 x weekly - 20 reps Seated Knee Extension AAROM - 3 x daily - 7 x weekly - 5-10 reps

## 2020-06-29 ENCOUNTER — Other Ambulatory Visit: Payer: Self-pay

## 2020-06-29 ENCOUNTER — Ambulatory Visit: Payer: Medicare Other | Admitting: Physical Therapy

## 2020-06-29 DIAGNOSIS — M25662 Stiffness of left knee, not elsewhere classified: Secondary | ICD-10-CM

## 2020-06-29 DIAGNOSIS — M25562 Pain in left knee: Secondary | ICD-10-CM | POA: Diagnosis not present

## 2020-06-29 DIAGNOSIS — R600 Localized edema: Secondary | ICD-10-CM

## 2020-06-29 DIAGNOSIS — M6281 Muscle weakness (generalized): Secondary | ICD-10-CM

## 2020-06-29 DIAGNOSIS — G8929 Other chronic pain: Secondary | ICD-10-CM

## 2020-06-29 DIAGNOSIS — R2689 Other abnormalities of gait and mobility: Secondary | ICD-10-CM

## 2020-06-29 NOTE — Therapy (Signed)
[place], [place], [address] Phone: [phone]   Fax:  [phone]  Physical Therapy Treatment  Patient Details  Name: Betty Holmes MRN: [id number] Date of Birth: 04/13/1956 Referring Provider (PT): Sydnee Cabal, MD   Encounter Date: 06/29/2020   PT End of Session - 06/29/20 1144    Visit Number 3    Number of Visits 16    Date for PT Re-Evaluation 08/17/20    Authorization Type UHC MCR    PT Start Time 1137    PT Stop Time 1225    PT Time Calculation (min) 48 min    Activity Tolerance Patient tolerated treatment well    Behavior During Therapy [facility] for tasks assessed/performed           Past Medical History:  Diagnosis Date  . Anxiety   . Arthritis   . At high risk for tick borne illness    had abx--test her again--she was okey---at [facility]  . Depression   . Fibromyalgia   . Fibromyalgia     Past Surgical History:  Procedure Laterality Date  . CESAREAN SECTION    . CYST EXCISION Right 05/09/2017   Procedure: EXCISION RIGHT INGUINAL CYST;  Surgeon: Clovis Riley, MD;  Location: [place];  Service: General;  Laterality: Right;  . KNEE ARTHROSCOPY    . KNEE SURGERY    . OVARIAN CYST REMOVAL      There were no vitals filed for this visit.   Subjective Assessment - 06/29/20 1141    Subjective Pt reports increased pain this morning. Normally knee pain is 4/10. Pt states she has been doing the exercises daily without too many issues. Pt reports she has been icing    Patient Stated Goals Get back to prior level of function    Currently in Pain? Yes    Pain Score 6     Pain Location Knee    Pain Orientation Left    Pain Descriptors / Indicators Sharp;Throbbing    Pain Type Surgical pain    Pain Onset 1 to 4 weeks ago                             [facility] Adult PT Treatment/Exercise - 06/29/20 0001      Knee/Hip Exercises:  Aerobic   Nustep L4 x 5 min with UE & LE      Knee/Hip Exercises: Standing   Gait Training Cueing to decrease trunk flexion and increase L knee flexion for swing through      Knee/Hip Exercises: Seated   Long Arc Quad Strengthening;Left;2 sets;10 reps    Heel Slides AROM;Left;10 reps    Ball Squeeze 2x10 with 2 sec hold    Clamshell with TheraBand Red   2x10 reps   Other Seated Knee/Hip Exercises Heel/toe raises x20    Other Seated Knee/Hip Exercises glute set 2 x10 with 3 sec hold    Marching Strengthening;Both;10 reps    Hamstring Curl Strengthening;Left;10 reps;2 sets    Hamstring Limitations red tband      Knee/Hip Exercises: Supine   Quad Sets 2 sets;10 reps    Heel Slides 10 reps;AAROM;2 sets   assisted with strap   Bridges Strengthening;5 reps      Vasopneumatic   Number Minutes Vasopneumatic  10 minutes    Vasopnuematic Location  Knee    Vasopneumatic Pressure Medium    Vasopneumatic  Temperature  Initially 34, increased to 42 and then to 50   Difficulty tolerating today     Manual Therapy   Manual Therapy Taping    Passive ROM knee extension    Kinesiotex Create Space;Edema      Kinesiotix   Edema L lateral and medial knee    Create Space L lateral and medial knee                    PT Short Term Goals - 06/22/20 1513      PT SHORT TERM GOAL #1   Title Patient will be I with initial HEP to progress in PT.    Time 4    Period Weeks    Status New    Target Date 07/20/20      PT SHORT TERM GOAL #2   Title Patient will be able to ambulate household distances with no AD to improve mobility    Time 4    Period Weeks    Status New    Target Date 07/20/20      PT SHORT TERM GOAL #3   Title Patient will exhibit improved knee active motion to 0-100 deg to improve gait mechanics    Time 4    Period Weeks    Status New    Target Date 07/20/20      PT SHORT TERM GOAL #4   Title Patient will be able to perform SLR without extension lag and sit<>stand  without UE support to indicate improved strength    Time 4    Period Weeks    Status New    Target Date 07/20/20      PT SHORT TERM GOAL #5   Title Patient will report </= 4/10 pain level with activity    Time 4    Period Weeks    Status New    Target Date 07/20/20             PT Long Term Goals - 06/22/20 1517      PT LONG TERM GOAL #1   Title Patient will be I with final HEP to maintain progress from PT    Time 8    Period Weeks    Status New    Target Date 08/17/20      PT LONG TERM GOAL #2   Title Patient will exhibit knee AROM >/= 0-120 deg to allow for return to hiking activities    Time 8    Period Weeks    Status New    Target Date 08/17/20      PT LONG TERM GOAL #3   Title Patient will exhibit improved knee strength to >/= 4+/5 MMT and hip strength >/= 4/5 MMT to be able to walk 1 mile with no limitation    Time 8    Period Weeks    Status New    Target Date 08/17/20      PT LONG TERM GOAL #4   Title Patient will be able perform SLS >/= 20 sec on left to indicate improved balance and stability    Time 8    Period Weeks    Status New    Target Date 08/17/20      PT LONG TERM GOAL #5   Title Patient will report improved functional level of </= 59% limitation on FOTO    Time 8    Period Weeks    Status New    Target Date 08/17/20  Plan - 06/29/20 1218    Clinical Impression Statement Pt with increased edema this session. Pt reports this is the worst its been within the past week; however, she has been icing and elevating at home. Taping provided for her edema. Added exercises to address hamstring, glutes and hip abduction. Edema and pain limited any further manual therapy and stretching. Difficulty tolerating vaso at 34 deg; increased temp however pt remained uncomfortable.    PT Treatment/Interventions ADLs/Self Care Home Management;Cryotherapy;Electrical Stimulation;Iontophoresis 4mg /ml Dexamethasone;Moist Heat;Neuromuscular  re-education;Balance training;Therapeutic exercise;Therapeutic activities;Functional mobility training;Stair training;Gait training;Patient/family education;Manual techniques;Dry needling;Passive range of motion;Taping;Vasopneumatic Device;Joint Manipulations    PT Next Visit Plan Assess HEP and progress PRN, NuStep, manual and stretching to improve knee motion, progress quad strengthening, gait training, vaso. Consider standing exercises if tolerable.    PT Home Exercise Plan [id number]    Consulted and Agree with Plan of Care Patient           Patient will benefit from skilled therapeutic intervention in order to improve the following deficits and impairments:  Abnormal gait, Decreased range of motion, Difficulty walking, Pain, Decreased activity tolerance, Decreased balance, Decreased strength, Increased edema  Visit Diagnosis: Chronic pain of left knee  Stiffness of left knee, not elsewhere classified  Muscle weakness (generalized)  Other abnormalities of gait and mobility  Localized edema     Problem List Patient Active Problem List   Diagnosis Date Noted  . GAD (generalized anxiety disorder) 09/21/2019  . Pain in left wrist 07/24/2017  . Painful wrist, right 07/24/2017  . Osteoporosis 04/21/2017  . Cervical stenosis of spine 04/21/2017  . Vitamin D deficiency 04/21/2017  . Recurrent major depressive disorder, in partial remission ([place]) 04/09/2017  . Chronic low back pain 04/09/2017  . Fibromyalgia     Koren Sermersheim April Ma L Landyn Buckalew PT, DPT 06/29/2020, 12:38 PM  [facility] [address] [place], [place], [address] Phone: [phone]   Fax:  [phone]  Name: Betty Holmes MRN: [id number] Date of Birth: 1956-03-07

## 2020-07-06 ENCOUNTER — Other Ambulatory Visit: Payer: Self-pay

## 2020-07-06 ENCOUNTER — Ambulatory Visit: Payer: Medicare Other | Admitting: Physical Therapy

## 2020-07-06 DIAGNOSIS — G8929 Other chronic pain: Secondary | ICD-10-CM

## 2020-07-06 DIAGNOSIS — R2689 Other abnormalities of gait and mobility: Secondary | ICD-10-CM

## 2020-07-06 DIAGNOSIS — R600 Localized edema: Secondary | ICD-10-CM

## 2020-07-06 DIAGNOSIS — M25562 Pain in left knee: Secondary | ICD-10-CM | POA: Diagnosis not present

## 2020-07-06 DIAGNOSIS — M6281 Muscle weakness (generalized): Secondary | ICD-10-CM

## 2020-07-06 DIAGNOSIS — M25662 Stiffness of left knee, not elsewhere classified: Secondary | ICD-10-CM

## 2020-07-06 NOTE — Therapy (Signed)
[place], [place], [address] Phone: [phone]   Fax:  [phone]  Physical Therapy Treatment  Patient Details  Name: Betty Holmes MRN: [id number] Date of Birth: 1956/02/25 Referring Provider (PT): Sydnee Cabal, MD   Encounter Date: 07/06/2020   PT End of Session - 07/06/20 0921    Visit Number 4    Number of Visits 16    Date for PT Re-Evaluation 08/17/20    Authorization Type UHC MCR    PT Start Time 0922    PT Stop Time 1012    PT Time Calculation (min) 50 min    Activity Tolerance Patient tolerated treatment well    Behavior During Therapy [facility] for tasks assessed/performed           Past Medical History:  Diagnosis Date  . Anxiety   . Arthritis   . At high risk for tick borne illness    had abx--test her again--she was okey---at [facility]  . Depression   . Fibromyalgia   . Fibromyalgia     Past Surgical History:  Procedure Laterality Date  . CESAREAN SECTION    . CYST EXCISION Right 05/09/2017   Procedure: EXCISION RIGHT INGUINAL CYST;  Surgeon: Clovis Riley, MD;  Location: [place];  Service: General;  Laterality: Right;  . KNEE ARTHROSCOPY    . KNEE SURGERY    . OVARIAN CYST REMOVAL      There were no vitals filed for this visit.   Subjective Assessment - 07/06/20 0924    Subjective Pt reports she's in less pain. She reports more weakness. Pt states she's been doing the exercises. Pt states she saw the doctor yesterday and she was pleased. Pt states that the taping did help with the edema    Patient Stated Goals Get back to prior level of function    Currently in Pain? Yes    Pain Score 3     Pain Location Knee    Pain Orientation Left    Pain Onset 1 to 4 weeks ago                             [facility] Adult PT Treatment/Exercise - 07/06/20 0001      Knee/Hip Exercises: Stretches   Knee: Self-Stretch to increase  Flexion Left;5 reps;10 seconds    Other Knee/Hip Stretches knee extension stretch with towel under ankle x 1 min    Other Knee/Hip Stretches seated self knee stretch x 10 reps      Knee/Hip Exercises: Aerobic   Nustep L5 x 5 min with UE & LE      Knee/Hip Exercises: Standing   Heel Raises Both;10 reps    Hip Flexion Both;10 reps    Gait Training cueing on how to use SPC; increase heel strike, decrease trunk flexion allow slight knee bend for swing through   Mild L hip trendelenburg     Knee/Hip Exercises: Seated   Other Seated Knee/Hip Exercises glute set 2 x10 with 3 sec hold      Knee/Hip Exercises: Supine   Quad Sets 2 sets;10 reps    Short Arc [employer] Strengthening;10 reps;Left      Vasopneumatic   Number Minutes Vasopneumatic  10 minutes    Vasopnuematic Location  Knee    Vasopneumatic Pressure Low    Vasopneumatic Temperature  40      Manual Therapy  Manual Therapy Taping;Joint mobilization    Joint Mobilization gentle knee mobs for flex & ext grade I to II    Passive ROM knee extension & flexion    Kinesiotex Create Space;Edema      Kinesiotix   Edema L lateral and medial knee    Create Space L lateral and medial knee                    PT Short Term Goals - 06/22/20 1513      PT SHORT TERM GOAL #1   Title Patient will be I with initial HEP to progress in PT.    Time 4    Period Weeks    Status New    Target Date 07/20/20      PT SHORT TERM GOAL #2   Title Patient will be able to ambulate household distances with no AD to improve mobility    Time 4    Period Weeks    Status New    Target Date 07/20/20      PT SHORT TERM GOAL #3   Title Patient will exhibit improved knee active motion to 0-100 deg to improve gait mechanics    Time 4    Period Weeks    Status New    Target Date 07/20/20      PT SHORT TERM GOAL #4   Title Patient will be able to perform SLR without extension lag and sit<>stand without UE support to indicate improved  strength    Time 4    Period Weeks    Status New    Target Date 07/20/20      PT SHORT TERM GOAL #5   Title Patient will report </= 4/10 pain level with activity    Time 4    Period Weeks    Status New    Target Date 07/20/20             PT Long Term Goals - 06/22/20 1517      PT LONG TERM GOAL #1   Title Patient will be I with final HEP to maintain progress from PT    Time 8    Period Weeks    Status New    Target Date 08/17/20      PT LONG TERM GOAL #2   Title Patient will exhibit knee AROM >/= 0-120 deg to allow for return to hiking activities    Time 8    Period Weeks    Status New    Target Date 08/17/20      PT LONG TERM GOAL #3   Title Patient will exhibit improved knee strength to >/= 4+/5 MMT and hip strength >/= 4/5 MMT to be able to walk 1 mile with no limitation    Time 8    Period Weeks    Status New    Target Date 08/17/20      PT LONG TERM GOAL #4   Title Patient will be able perform SLS >/= 20 sec on left to indicate improved balance and stability    Time 8    Period Weeks    Status New    Target Date 08/17/20      PT LONG TERM GOAL #5   Title Patient will report improved functional level of </= 59% limitation on FOTO    Time 8    Period Weeks    Status New    Target Date 08/17/20  Plan - 07/06/20 1100    Clinical Impression Statement Pt is 2 weeks post op with improving edema and pain management. Taping provided for her bruising and swelling along medial knee/thigh. Increased pt's exercises to include some standing. Manual therapy performed to increase ROM in flexion and extension. Knee ROM ~10 to 70 deg. Pt able to improve gait quality by end of session after gait training.    PT Treatment/Interventions ADLs/Self Care Home Management;Cryotherapy;Electrical Stimulation;Iontophoresis 4mg /ml Dexamethasone;Moist Heat;Neuromuscular re-education;Balance training;Therapeutic exercise;Therapeutic activities;Functional mobility  training;Stair training;Gait training;Patient/family education;Manual techniques;Dry needling;Passive range of motion;Taping;Vasopneumatic Device;Joint Manipulations    PT Next Visit Plan Assess HEP and progress PRN, NuStep, manual and stretching to improve knee motion, progress quad strengthening, gait training, vaso. Progress standing exercises if tolerated.    PT Home Exercise Plan [id number]    Consulted and Agree with Plan of Care Patient           Patient will benefit from skilled therapeutic intervention in order to improve the following deficits and impairments:  Abnormal gait, Decreased range of motion, Difficulty walking, Pain, Decreased activity tolerance, Decreased balance, Decreased strength, Increased edema  Visit Diagnosis: Chronic pain of left knee  Stiffness of left knee, not elsewhere classified  Muscle weakness (generalized)  Other abnormalities of gait and mobility  Localized edema     Problem List Patient Active Problem List   Diagnosis Date Noted  . GAD (generalized anxiety disorder) 09/21/2019  . Pain in left wrist 07/24/2017  . Painful wrist, right 07/24/2017  . Osteoporosis 04/21/2017  . Cervical stenosis of spine 04/21/2017  . Vitamin D deficiency 04/21/2017  . Recurrent major depressive disorder, in partial remission ([place]) 04/09/2017  . Chronic low back pain 04/09/2017  . Fibromyalgia     Brooklin Rieger April Ma L Subrena Devereux PT, DPT 07/06/2020, 11:06 AM  [facility] [address] [place], [place], [address] Phone: [phone]   Fax:  [phone]  Name: Betty Holmes MRN: [id number] Date of Birth: 06-29-1956

## 2020-07-10 ENCOUNTER — Other Ambulatory Visit: Payer: Self-pay

## 2020-07-10 ENCOUNTER — Ambulatory Visit: Payer: Medicare Other | Admitting: Physical Therapy

## 2020-07-10 DIAGNOSIS — M25662 Stiffness of left knee, not elsewhere classified: Secondary | ICD-10-CM

## 2020-07-10 DIAGNOSIS — G8929 Other chronic pain: Secondary | ICD-10-CM

## 2020-07-10 DIAGNOSIS — R600 Localized edema: Secondary | ICD-10-CM

## 2020-07-10 DIAGNOSIS — M25562 Pain in left knee: Secondary | ICD-10-CM | POA: Diagnosis not present

## 2020-07-10 DIAGNOSIS — M6281 Muscle weakness (generalized): Secondary | ICD-10-CM

## 2020-07-10 DIAGNOSIS — R2689 Other abnormalities of gait and mobility: Secondary | ICD-10-CM

## 2020-07-10 NOTE — Therapy (Signed)
[place], [place], [address] Phone: [phone]   Fax:  [phone]  Physical Therapy Treatment  Patient Details  Name: Betty Holmes MRN: [id number] Date of Birth: [birth year] Referring Provider (PT): Sydnee Cabal, MD   Encounter Date: 07/10/2020   PT End of Session - 07/10/20 1051    Visit Number 5    Number of Visits 16    Date for PT Re-Evaluation 08/17/20    Authorization Type UHC MCR    PT Start Time 1009    PT Stop Time 1048    PT Time Calculation (min) 39 min    Activity Tolerance Patient tolerated treatment well    Behavior During Therapy [facility] for tasks assessed/performed           Past Medical History:  Diagnosis Date  . Anxiety   . Arthritis   . At high risk for tick borne illness    had abx--test her again--she was okey---at [facility]  . Depression   . Fibromyalgia   . Fibromyalgia     Past Surgical History:  Procedure Laterality Date  . CESAREAN SECTION    . CYST EXCISION Right 05/09/2017   Procedure: EXCISION RIGHT INGUINAL CYST;  Surgeon: Clovis Riley, MD;  Location: [place];  Service: General;  Laterality: Right;  . KNEE ARTHROSCOPY    . KNEE SURGERY    . OVARIAN CYST REMOVAL      There were no vitals filed for this visit.   Subjective Assessment - 07/10/20 1013    Subjective Pt reports she's been bending her knee a lot with marching. She states that the tape was effective to help with the bruising and swelling.    Limitations Sitting;Standing;House hold activities;Walking;Lifting    Patient Stated Goals Get back to prior level of function    Currently in Pain? Yes    Pain Score 5     Pain Location Knee    Pain Orientation Left    Pain Onset 1 to 4 weeks ago                             [facility] Adult PT Treatment/Exercise - 07/10/20 0001      Knee/Hip Exercises: Stretches   Other Knee/Hip Stretches  seated self knee stretch x 10 reps      Knee/Hip Exercises: Aerobic   Nustep L5 x 5 min with UE & LE      Knee/Hip Exercises: Standing   Heel Raises Both;10 reps    Hip Abduction Stengthening;Both;10 reps    Hip Extension Stengthening;Both;10 reps      Knee/Hip Exercises: Seated   Long Arc Quad Strengthening;Left;2 sets;10 reps    Marching Strengthening;Both;10 reps    Hamstring Curl Strengthening;Left;10 reps;2 sets    Hamstring Limitations green tband      Knee/Hip Exercises: Supine   Quad Sets 2 sets;10 reps    Short Arc [employer] Strengthening;10 reps;Left    Straight Leg Raises 1 set;10 reps      Manual Therapy   Manual Therapy Taping;Joint mobilization    Joint Mobilization knee mobs for flex & ext grade II to III    Passive ROM knee extension & flexion    Kinesiotex Create Space;Edema      Kinesiotix   Create Space L medial knee  PT Education - 07/10/20 1050    Education Details Discussed continuing to ice and HEP modifications    Person(s) Educated Patient    Methods Explanation;Demonstration;Tactile cues;Verbal cues;Handout    Comprehension Verbalized understanding;Returned demonstration;Verbal cues required;Tactile cues required            PT Short Term Goals - 06/22/20 1513      PT SHORT TERM GOAL #1   Title Patient will be I with initial HEP to progress in PT.    Time 4    Period Weeks    Status New    Target Date 07/20/20      PT SHORT TERM GOAL #2   Title Patient will be able to ambulate household distances with no AD to improve mobility    Time 4    Period Weeks    Status New    Target Date 07/20/20      PT SHORT TERM GOAL #3   Title Patient will exhibit improved knee active motion to 0-100 deg to improve gait mechanics    Time 4    Period Weeks    Status New    Target Date 07/20/20      PT SHORT TERM GOAL #4   Title Patient will be able to perform SLR without extension lag and sit<>stand without UE support to  indicate improved strength    Time 4    Period Weeks    Status New    Target Date 07/20/20      PT SHORT TERM GOAL #5   Title Patient will report </= 4/10 pain level with activity    Time 4    Period Weeks    Status New    Target Date 07/20/20             PT Long Term Goals - 06/22/20 1517      PT LONG TERM GOAL #1   Title Patient will be I with final HEP to maintain progress from PT    Time 8    Period Weeks    Status New    Target Date 08/17/20      PT LONG TERM GOAL #2   Title Patient will exhibit knee AROM >/= 0-120 deg to allow for return to hiking activities    Time 8    Period Weeks    Status New    Target Date 08/17/20      PT LONG TERM GOAL #3   Title Patient will exhibit improved knee strength to >/= 4+/5 MMT and hip strength >/= 4/5 MMT to be able to walk 1 mile with no limitation    Time 8    Period Weeks    Status New    Target Date 08/17/20      PT LONG TERM GOAL #4   Title Patient will be able perform SLS >/= 20 sec on left to indicate improved balance and stability    Time 8    Period Weeks    Status New    Target Date 08/17/20      PT LONG TERM GOAL #5   Title Patient will report improved functional level of </= 59% limitation on FOTO    Time 8    Period Weeks    Status New    Target Date 08/17/20                 Plan - 07/10/20 1051    Clinical Impression Statement Pt will be 3 weeks post  op this week. Pt returns with improved edema and bruising. Continued taping for her medial L knee and improving knee ROM with manual therapy. Progressed pt's exercises to include more standing and full weight bearing. Knee ROM ~10 to 80 deg. Continues to have mild lag with SLR.    Personal Factors and Comorbidities Past/Current Experience;Fitness    Examination-Activity Limitations Locomotion Level;Transfers;Sleep;Squat;Stairs;Stand;Lift;Dressing;Bed Mobility;Sit    Examination-Participation Restrictions Meal Prep;Cleaning;Community  Activity;Driving;Shop;Laundry;Yard Work    Stability/Clinical Decision Making Stable/Uncomplicated    Clinical Decision Making Low    Rehab Potential Good    PT Frequency 2x / week    PT Duration 8 weeks    PT Treatment/Interventions ADLs/Self Care Home Management;Cryotherapy;Electrical Stimulation;Iontophoresis 4mg /ml Dexamethasone;Moist Heat;Neuromuscular re-education;Balance training;Therapeutic exercise;Therapeutic activities;Functional mobility training;Stair training;Gait training;Patient/family education;Manual techniques;Dry needling;Passive range of motion;Taping;Vasopneumatic Device;Joint Manipulations    PT Next Visit Plan Assess HEP and progress PRN, NuStep, manual and stretching to improve knee motion, progress quad strengthening/standing, gait training, vaso. Consider adding closed chain exercises if tolerated    PT Home Exercise Plan [id number]    Consulted and Agree with Plan of Care Patient           Patient will benefit from skilled therapeutic intervention in order to improve the following deficits and impairments:  Abnormal gait, Decreased range of motion, Difficulty walking, Pain, Decreased activity tolerance, Decreased balance, Decreased strength, Increased edema  Visit Diagnosis: Chronic pain of left knee  Stiffness of left knee, not elsewhere classified  Muscle weakness (generalized)  Other abnormalities of gait and mobility  Localized edema     Problem List Patient Active Problem List   Diagnosis Date Noted  . GAD (generalized anxiety disorder) 09/21/2019  . Pain in left wrist 07/24/2017  . Painful wrist, right 07/24/2017  . Osteoporosis 04/21/2017  . Cervical stenosis of spine 04/21/2017  . Vitamin D deficiency 04/21/2017  . Recurrent major depressive disorder, in partial remission ([place]) 04/09/2017  . Chronic low back pain 04/09/2017  . Fibromyalgia     Tsuruko Murtha April Ma L Loralei Radcliffe PT, DPT 07/10/2020, 10:56 AM  [facility] [address] [place], [place], [address] Phone: [phone]   Fax:  [phone]  Name: Betty Holmes MRN: [id number] Date of Birth: [birth year]

## 2020-07-13 ENCOUNTER — Ambulatory Visit: Payer: Medicare Other | Admitting: Physical Therapy

## 2020-07-13 ENCOUNTER — Other Ambulatory Visit: Payer: Self-pay

## 2020-07-13 ENCOUNTER — Telehealth: Payer: Self-pay | Admitting: Nurse Practitioner

## 2020-07-13 DIAGNOSIS — R600 Localized edema: Secondary | ICD-10-CM

## 2020-07-13 DIAGNOSIS — G8929 Other chronic pain: Secondary | ICD-10-CM

## 2020-07-13 DIAGNOSIS — M25562 Pain in left knee: Secondary | ICD-10-CM | POA: Diagnosis not present

## 2020-07-13 DIAGNOSIS — R2689 Other abnormalities of gait and mobility: Secondary | ICD-10-CM

## 2020-07-13 DIAGNOSIS — M25662 Stiffness of left knee, not elsewhere classified: Secondary | ICD-10-CM

## 2020-07-13 DIAGNOSIS — M6281 Muscle weakness (generalized): Secondary | ICD-10-CM

## 2020-07-13 NOTE — Telephone Encounter (Signed)
Left message for patient to schedule Annual Wellness Visit.  Please schedule with Nurse Health Advisor Martha Stanley, RN at [facility]  °

## 2020-07-13 NOTE — Therapy (Signed)
[place], [place], [address] Phone: [phone]   Fax:  [phone]  Physical Therapy Treatment  Patient Details  Name: Betty Holmes MRN: [id number] Date of Birth: Sep 09, 1956 Referring Provider (PT): Sydnee Cabal, MD   Encounter Date: 07/13/2020   PT End of Session - 07/13/20 1007    Visit Number 6    Number of Visits 16    Date for PT Re-Evaluation 08/17/20    Authorization Type UHC MCR    PT Start Time 0920    PT Stop Time 1012    PT Time Calculation (min) 52 min    Activity Tolerance Patient tolerated treatment well    Behavior During Therapy [facility] for tasks assessed/performed           Past Medical History:  Diagnosis Date  . Anxiety   . Arthritis   . At high risk for tick borne illness    had abx--test her again--she was okey---at [facility]  . Depression   . Fibromyalgia   . Fibromyalgia     Past Surgical History:  Procedure Laterality Date  . CESAREAN SECTION    . CYST EXCISION Right 05/09/2017   Procedure: EXCISION RIGHT INGUINAL CYST;  Surgeon: Clovis Riley, MD;  Location: [place];  Service: General;  Laterality: Right;  . KNEE ARTHROSCOPY    . KNEE SURGERY    . OVARIAN CYST REMOVAL      There were no vitals filed for this visit.   Subjective Assessment - 07/13/20 0925    Subjective Pt states the tape stayed on for 2 days and there has been a difference in edema and bruising. Pt with improved gait.    Limitations Sitting;Standing;House hold activities;Walking;Lifting    Patient Stated Goals Get back to prior level of function    Currently in Pain? Yes    Pain Score 5     Pain Location Knee    Pain Orientation Left    Pain Onset 1 to 4 weeks ago                             [facility] Adult PT Treatment/Exercise - 07/13/20 0001      Knee/Hip Exercises: Stretches   Knee: Self-Stretch to increase Flexion Left;5  reps;10 seconds    Knee: Self-Stretch Limitations in prone      Knee/Hip Exercises: Aerobic   Recumbent Bike partial revolutions x 5 min      Knee/Hip Exercises: Standing   Heel Raises Both;10 reps;2 sets    Other Standing Knee Exercises terminal knee extension 2 x 10      Knee/Hip Exercises: Seated   Hamstring Curl Strengthening;Left;10 reps;2 sets    Hamstring Limitations green tband      Knee/Hip Exercises: Supine   Quad Sets 2 sets;10 reps    Quad Sets Limitations towel under heel    Straight Leg Raises Strengthening;2 sets;10 reps      Knee/Hip Exercises: Sidelying   Hip ABduction Strengthening;Left;10 reps      Knee/Hip Exercises: Prone   Hamstring Curl 1 set;10 reps    Hip Extension Strengthening;Left;10 reps;2 sets      Manual Therapy   Manual Therapy Joint mobilization    Joint Mobilization knee mobs for flex & ext grade II to III    Passive ROM knee extension & flexion  PT Short Term Goals - 06/22/20 1513      PT SHORT TERM GOAL #1   Title Patient will be I with initial HEP to progress in PT.    Time 4    Period Weeks    Status New    Target Date 07/20/20      PT SHORT TERM GOAL #2   Title Patient will be able to ambulate household distances with no AD to improve mobility    Time 4    Period Weeks    Status New    Target Date 07/20/20      PT SHORT TERM GOAL #3   Title Patient will exhibit improved knee active motion to 0-100 deg to improve gait mechanics    Time 4    Period Weeks    Status New    Target Date 07/20/20      PT SHORT TERM GOAL #4   Title Patient will be able to perform SLR without extension lag and sit<>stand without UE support to indicate improved strength    Time 4    Period Weeks    Status New    Target Date 07/20/20      PT SHORT TERM GOAL #5   Title Patient will report </= 4/10 pain level with activity    Time 4    Period Weeks    Status New    Target Date 07/20/20             PT Long  Term Goals - 06/22/20 1517      PT LONG TERM GOAL #1   Title Patient will be I with final HEP to maintain progress from PT    Time 8    Period Weeks    Status New    Target Date 08/17/20      PT LONG TERM GOAL #2   Title Patient will exhibit knee AROM >/= 0-120 deg to allow for return to hiking activities    Time 8    Period Weeks    Status New    Target Date 08/17/20      PT LONG TERM GOAL #3   Title Patient will exhibit improved knee strength to >/= 4+/5 MMT and hip strength >/= 4/5 MMT to be able to walk 1 mile with no limitation    Time 8    Period Weeks    Status New    Target Date 08/17/20      PT LONG TERM GOAL #4   Title Patient will be able perform SLS >/= 20 sec on left to indicate improved balance and stability    Time 8    Period Weeks    Status New    Target Date 08/17/20      PT LONG TERM GOAL #5   Title Patient will report improved functional level of </= 59% limitation on FOTO    Time 8    Period Weeks    Status New    Target Date 08/17/20                 Plan - 07/13/20 1008    Clinical Impression Statement Pt continues to improve in her knee ROM and edema. Modified HEP to reduce strain on pt's back. Pt able to perform SLR higher but continues to have mild lag. Treatment focused on continued AROM, quad and hip strengthening.    Personal Factors and Comorbidities Past/Current Experience;Fitness    Examination-Activity Limitations Locomotion Level;Transfers;Sleep;Squat;Stairs;Stand;Lift;Dressing;Bed Mobility;Sit  Examination-Participation Restrictions Meal Prep;Cleaning;Community Activity;Driving;Shop;Laundry;Yard Work    Stability/Clinical Decision Making Stable/Uncomplicated    Rehab Potential Good    PT Frequency 2x / week    PT Duration 8 weeks    PT Treatment/Interventions ADLs/Self Care Home Management;Cryotherapy;Electrical Stimulation;Iontophoresis 4mg /ml Dexamethasone;Moist Heat;Neuromuscular re-education;Balance training;Therapeutic  exercise;Therapeutic activities;Functional mobility training;Stair training;Gait training;Patient/family education;Manual techniques;Dry needling;Passive range of motion;Taping;Vasopneumatic Device;Joint Manipulations    PT Next Visit Plan Assess HEP and progress PRN, NuStep, manual and stretching to improve knee motion, progress quad strengthening/standing, gait training, vaso. Consider adding closed chain exercises next session (i.e. wall squat)    PT Home Exercise Plan [id number]    Consulted and Agree with Plan of Care Patient           Patient will benefit from skilled therapeutic intervention in order to improve the following deficits and impairments:  Abnormal gait, Decreased range of motion, Difficulty walking, Pain, Decreased activity tolerance, Decreased balance, Decreased strength, Increased edema  Visit Diagnosis: Chronic pain of left knee  Stiffness of left knee, not elsewhere classified  Muscle weakness (generalized)  Other abnormalities of gait and mobility  Localized edema     Problem List Patient Active Problem List   Diagnosis Date Noted  . GAD (generalized anxiety disorder) 09/21/2019  . Pain in left wrist 07/24/2017  . Painful wrist, right 07/24/2017  . Osteoporosis 04/21/2017  . Cervical stenosis of spine 04/21/2017  . Vitamin D deficiency 04/21/2017  . Recurrent major depressive disorder, in partial remission ([place]) 04/09/2017  . Chronic low back pain 04/09/2017  . Fibromyalgia     Vennie Waymire April Ma L Dior Dominik PT, DPT 07/13/2020, 10:14 AM  [facility] [address] [place], [place], [address] Phone: [phone]   Fax:  [phone]  Name: ASHELYNN MARKS MRN: [id number] Date of Birth: 07/27/1956

## 2020-07-17 ENCOUNTER — Other Ambulatory Visit: Payer: Self-pay

## 2020-07-17 ENCOUNTER — Ambulatory Visit: Payer: Medicare Other | Admitting: Physical Therapy

## 2020-07-17 DIAGNOSIS — M25562 Pain in left knee: Secondary | ICD-10-CM

## 2020-07-17 DIAGNOSIS — R600 Localized edema: Secondary | ICD-10-CM

## 2020-07-17 DIAGNOSIS — M6281 Muscle weakness (generalized): Secondary | ICD-10-CM

## 2020-07-17 DIAGNOSIS — R2689 Other abnormalities of gait and mobility: Secondary | ICD-10-CM

## 2020-07-17 DIAGNOSIS — M25662 Stiffness of left knee, not elsewhere classified: Secondary | ICD-10-CM

## 2020-07-17 DIAGNOSIS — G8929 Other chronic pain: Secondary | ICD-10-CM

## 2020-07-17 NOTE — Therapy (Signed)
[place], [place], [address] Phone: [phone]   Fax:  [phone]  Physical Therapy Treatment  Patient Details  Name: Betty Holmes MRN: [id number] Date of Birth: 04/12/1956 Referring Provider (PT): Sydnee Cabal, MD   Encounter Date: 07/17/2020   PT End of Session - 07/17/20 1008    Visit Number 7    Number of Visits 16    Date for PT Re-Evaluation 08/17/20    Authorization Type UHC MCR    PT Start Time 1008    PT Stop Time 1048    PT Time Calculation (min) 40 min    Activity Tolerance Patient tolerated treatment well    Behavior During Therapy [facility] for tasks assessed/performed           Past Medical History:  Diagnosis Date  . Anxiety   . Arthritis   . At high risk for tick borne illness    had abx--test her again--she was okey---at [facility]  . Depression   . Fibromyalgia   . Fibromyalgia     Past Surgical History:  Procedure Laterality Date  . CESAREAN SECTION    . CYST EXCISION Right 05/09/2017   Procedure: EXCISION RIGHT INGUINAL CYST;  Surgeon: Clovis Riley, MD;  Location: [place];  Service: General;  Laterality: Right;  . KNEE ARTHROSCOPY    . KNEE SURGERY    . OVARIAN CYST REMOVAL      There were no vitals filed for this visit.   Subjective Assessment - 07/17/20 1011    Subjective Pt states she was able to go throughout the weekend alone at the house. She states the knee is starting to get better and better.    Limitations Sitting;Standing;House hold activities;Walking;Lifting    Patient Stated Goals Get back to prior level of function    Currently in Pain? Yes    Pain Score 4     Pain Location Knee    Pain Orientation Left    Pain Onset 1 to 4 weeks ago                             [facility] Adult PT Treatment/Exercise - 07/17/20 0001      Knee/Hip Exercises: Stretches   Hip Flexor Stretch Left;2 reps;30  seconds   off edge of mat table   Gastroc Stretch Both;2 reps;30 seconds   on slant board   Soleus Stretch Both;2 reps;30 seconds   on slant board     Knee/Hip Exercises: Aerobic   Nustep L5 x 5 min with UE & LE      Knee/Hip Exercises: Standing   Forward Step Up Left;10 reps;Hand Hold: 1;Step Height: 2"    Other Standing Knee Exercises squat with bar hold 2 x 10       Knee/Hip Exercises: Supine   Quad Sets 2 sets;10 reps    Quad Sets Limitations towel under heel    Heel Slides 10 reps;AAROM;2 sets    Heel Slides Limitations with contract relax & manual assist      Knee/Hip Exercises: Sidelying   Hip ABduction Strengthening;Left;10 reps;2 sets      Knee/Hip Exercises: Prone   Hamstring Curl 10 reps;2 sets    Hip Extension Strengthening;Left;10 reps;2 sets      Manual Therapy   Manual Therapy Joint mobilization    Joint Mobilization knee mobs for flex & ext grade II to III  Passive ROM knee extension & flexion                    PT Short Term Goals - 06/22/20 1513      PT SHORT TERM GOAL #1   Title Patient will be I with initial HEP to progress in PT.    Time 4    Period Weeks    Status New    Target Date 07/20/20      PT SHORT TERM GOAL #2   Title Patient will be able to ambulate household distances with no AD to improve mobility    Time 4    Period Weeks    Status New    Target Date 07/20/20      PT SHORT TERM GOAL #3   Title Patient will exhibit improved knee active motion to 0-100 deg to improve gait mechanics    Time 4    Period Weeks    Status New    Target Date 07/20/20      PT SHORT TERM GOAL #4   Title Patient will be able to perform SLR without extension lag and sit<>stand without UE support to indicate improved strength    Time 4    Period Weeks    Status New    Target Date 07/20/20      PT SHORT TERM GOAL #5   Title Patient will report </= 4/10 pain level with activity    Time 4    Period Weeks    Status New    Target Date  07/20/20             PT Long Term Goals - 06/22/20 1517      PT LONG TERM GOAL #1   Title Patient will be I with final HEP to maintain progress from PT    Time 8    Period Weeks    Status New    Target Date 08/17/20      PT LONG TERM GOAL #2   Title Patient will exhibit knee AROM >/= 0-120 deg to allow for return to hiking activities    Time 8    Period Weeks    Status New    Target Date 08/17/20      PT LONG TERM GOAL #3   Title Patient will exhibit improved knee strength to >/= 4+/5 MMT and hip strength >/= 4/5 MMT to be able to walk 1 mile with no limitation    Time 8    Period Weeks    Status New    Target Date 08/17/20      PT LONG TERM GOAL #4   Title Patient will be able perform SLS >/= 20 sec on left to indicate improved balance and stability    Time 8    Period Weeks    Status New    Target Date 08/17/20      PT LONG TERM GOAL #5   Title Patient will report improved functional level of </= 59% limitation on FOTO    Time 8    Period Weeks    Status New    Target Date 08/17/20                 Plan - 07/17/20 1050    Clinical Impression Statement Pt tolerated treatment session well. Reinforced HEP. Treatment continues to improve ROM with stretching, PROM, joint mobilizations and strengthening. Initiated step exercises and squatting (pt able to perform with minimal pain).  Personal Factors and Comorbidities Past/Current Experience;Fitness    Examination-Activity Limitations Locomotion Level;Transfers;Sleep;Squat;Stairs;Stand;Lift;Dressing;Bed Mobility;Sit    Examination-Participation Restrictions Meal Prep;Cleaning;Community Activity;Driving;Shop;Laundry;Yard Work    Stability/Clinical Decision Making Stable/Uncomplicated    Rehab Potential Good    PT Frequency 2x / week    PT Duration 8 weeks    PT Treatment/Interventions ADLs/Self Care Home Management;Cryotherapy;Electrical Stimulation;Iontophoresis 4mg /ml Dexamethasone;Moist Heat;Neuromuscular  re-education;Balance training;Therapeutic exercise;Therapeutic activities;Functional mobility training;Stair training;Gait training;Patient/family education;Manual techniques;Dry needling;Passive range of motion;Taping;Vasopneumatic Device;Joint Manipulations    PT Next Visit Plan Assess HEP and progress PRN, NuStep, manual and stretching to improve knee motion, progress quad strengthening/standing, gait training, vaso. Progress squatting and steps. Gait training without a/d.    PT Home Exercise Plan [id number]    Consulted and Agree with Plan of Care Patient           Patient will benefit from skilled therapeutic intervention in order to improve the following deficits and impairments:  Abnormal gait, Decreased range of motion, Difficulty walking, Pain, Decreased activity tolerance, Decreased balance, Decreased strength, Increased edema  Visit Diagnosis: Chronic pain of left knee  Stiffness of left knee, not elsewhere classified  Muscle weakness (generalized)  Other abnormalities of gait and mobility  Localized edema     Problem List Patient Active Problem List   Diagnosis Date Noted  . GAD (generalized anxiety disorder) 09/21/2019  . Pain in left wrist 07/24/2017  . Painful wrist, right 07/24/2017  . Osteoporosis 04/21/2017  . Cervical stenosis of spine 04/21/2017  . Vitamin D deficiency 04/21/2017  . Recurrent major depressive disorder, in partial remission ([place]) 04/09/2017  . Chronic low back pain 04/09/2017  . Fibromyalgia     Jadyn Brasher April Ma L Awanda Wilcock PT, DPT 07/17/2020, 10:52 AM  [facility] [address] [place], [place], [address] Phone: [phone]   Fax:  [phone]  Name: Betty Holmes MRN: [id number] Date of Birth: [birth year]

## 2020-07-19 ENCOUNTER — Ambulatory Visit: Payer: Medicare Other | Admitting: Physical Therapy

## 2020-07-19 ENCOUNTER — Other Ambulatory Visit: Payer: Self-pay

## 2020-07-19 DIAGNOSIS — R2689 Other abnormalities of gait and mobility: Secondary | ICD-10-CM

## 2020-07-19 DIAGNOSIS — M25662 Stiffness of left knee, not elsewhere classified: Secondary | ICD-10-CM

## 2020-07-19 DIAGNOSIS — M6281 Muscle weakness (generalized): Secondary | ICD-10-CM

## 2020-07-19 DIAGNOSIS — M25562 Pain in left knee: Secondary | ICD-10-CM | POA: Diagnosis not present

## 2020-07-19 DIAGNOSIS — R600 Localized edema: Secondary | ICD-10-CM

## 2020-07-19 DIAGNOSIS — G8929 Other chronic pain: Secondary | ICD-10-CM

## 2020-07-19 NOTE — Therapy (Signed)
[place], [place], [address] Phone: [phone]   Fax:  [phone]  Physical Therapy Treatment  Patient Details  Name: Betty Holmes MRN: [id number] Date of Birth: Feb 06, 1956 Referring Provider (PT): Sydnee Cabal, MD   Encounter Date: 07/19/2020   PT End of Session - 07/19/20 1000    Visit Number 8    Number of Visits 16    Date for PT Re-Evaluation 08/17/20    Authorization Type UHC MCR    PT Start Time 1001    PT Stop Time 1045    PT Time Calculation (min) 44 min    Activity Tolerance Patient tolerated treatment well    Behavior During Therapy [facility] for tasks assessed/performed           Past Medical History:  Diagnosis Date  . Anxiety   . Arthritis   . At high risk for tick borne illness    had abx--test her again--she was okey---at [facility]  . Depression   . Fibromyalgia   . Fibromyalgia     Past Surgical History:  Procedure Laterality Date  . CESAREAN SECTION    . CYST EXCISION Right 05/09/2017   Procedure: EXCISION RIGHT INGUINAL CYST;  Surgeon: Clovis Riley, MD;  Location: [place];  Service: General;  Laterality: Right;  . KNEE ARTHROSCOPY    . KNEE SURGERY    . OVARIAN CYST REMOVAL      There were no vitals filed for this visit.   Subjective Assessment - 07/19/20 1005    Subjective Pt states the knee has been hurting -- more in the calf but continues to swell around medial knee. However, she notes improved ROM.    Limitations Sitting;Standing;House hold activities;Walking;Lifting    How long can you sit comfortably? 30 minutes    How long can you stand comfortably? < 5 minutes    How long can you walk comfortably? < 5 minutes    Patient Stated Goals Get back to prior level of function    Currently in Pain? Yes    Pain Score 6     Pain Location Knee    Pain Orientation Left    Pain Onset 1 to 4 weeks ago                              [facility] Adult PT Treatment/Exercise - 07/19/20 0001      Knee/Hip Exercises: Stretches   Other Knee/Hip Stretches anterior tibialis stretch in seated x 30 sec, posterior tibialis stretch x 30 sec      Knee/Hip Exercises: Standing   Terminal Knee Extension Strengthening;Left;10 reps    Theraband Level (Terminal Knee Extension) Level 4 (Blue)    Lateral Step Up Both;10 reps;Step Height: 2";Hand Hold: 0    Forward Step Up Left;10 reps;Hand Hold: 1;Step Height: 4"    Step Down Both;10 reps;Step Height: 2"    Wall Squat 10 reps      Knee/Hip Exercises: Supine   Quad Sets 2 sets;10 reps    Quad Sets Limitations towel under heel   contract relax   Heel Slides 10 reps;AAROM;2 sets    Straight Leg Raises Strengthening;2 sets;10 reps    Other Supine Knee/Hip Exercises ankle df/pf x 10      Manual Therapy   Manual Therapy Joint mobilization;Passive ROM    Manual therapy comments STM anterior tib, gastroc, fibularis, and anterior tibialis  Joint Mobilization knee mobs for flex & ext grade II to III    Passive ROM knee extension & flexion, ankle DF & PF (especial anterior tib & fibularis)                    PT Short Term Goals - 06/22/20 1513      PT SHORT TERM GOAL #1   Title Patient will be I with initial HEP to progress in PT.    Time 4    Period Weeks    Status New    Target Date 07/20/20      PT SHORT TERM GOAL #2   Title Patient will be able to ambulate household distances with no AD to improve mobility    Time 4    Period Weeks    Status New    Target Date 07/20/20      PT SHORT TERM GOAL #3   Title Patient will exhibit improved knee active motion to 0-100 deg to improve gait mechanics    Time 4    Period Weeks    Status New    Target Date 07/20/20      PT SHORT TERM GOAL #4   Title Patient will be able to perform SLR without extension lag and sit<>stand without UE support to indicate improved strength    Time 4    Period Weeks     Status New    Target Date 07/20/20      PT SHORT TERM GOAL #5   Title Patient will report </= 4/10 pain level with activity    Time 4    Period Weeks    Status New    Target Date 07/20/20             PT Long Term Goals - 06/22/20 1517      PT LONG TERM GOAL #1   Title Patient will be I with final HEP to maintain progress from PT    Time 8    Period Weeks    Status New    Target Date 08/17/20      PT LONG TERM GOAL #2   Title Patient will exhibit knee AROM >/= 0-120 deg to allow for return to hiking activities    Time 8    Period Weeks    Status New    Target Date 08/17/20      PT LONG TERM GOAL #3   Title Patient will exhibit improved knee strength to >/= 4+/5 MMT and hip strength >/= 4/5 MMT to be able to walk 1 mile with no limitation    Time 8    Period Weeks    Status New    Target Date 08/17/20      PT LONG TERM GOAL #4   Title Patient will be able perform SLS >/= 20 sec on left to indicate improved balance and stability    Time 8    Period Weeks    Status New    Target Date 08/17/20      PT LONG TERM GOAL #5   Title Patient will report improved functional level of </= 59% limitation on FOTO    Time 8    Period Weeks    Status New    Target Date 08/17/20                 Plan - 07/19/20 1046    Clinical Impression Statement PT progressed step training and closed kinetic chain exercises  this sesion. Pt with improved gait pattern with cueing. ROM continues to improve: knee extension -7 deg, flexion >100 deg. Pt is making good progress. PT will monitor incision -- mild erythema noted along proximal incision site. Some continued edema along medial knee. Pt with continued calf and shin tightness addressed with manual and stretching.    Personal Factors and Comorbidities Past/Current Experience;Fitness    Examination-Activity Limitations Locomotion Level;Transfers;Sleep;Squat;Stairs;Stand;Lift;Dressing;Bed Mobility;Sit    Examination-Participation  Restrictions Meal Prep;Cleaning;Community Activity;Driving;Shop;Laundry;Yard Work    Stability/Clinical Decision Making Stable/Uncomplicated    Rehab Potential Good    PT Frequency 2x / week    PT Duration 8 weeks    PT Treatment/Interventions ADLs/Self Care Home Management;Cryotherapy;Electrical Stimulation;Iontophoresis 4mg /ml Dexamethasone;Moist Heat;Neuromuscular re-education;Balance training;Therapeutic exercise;Therapeutic activities;Functional mobility training;Stair training;Gait training;Patient/family education;Manual techniques;Dry needling;Passive range of motion;Taping;Vasopneumatic Device;Joint Manipulations    PT Next Visit Plan Assess HEP and progress PRN, continue bike, manual and stretching to improve knee motion, progress quad strengthening/standing, gait training, vaso. Progress squatting and steps. Consider balance/proprioception on next session.    PT Home Exercise Plan [id number]    Consulted and Agree with Plan of Care Patient           Patient will benefit from skilled therapeutic intervention in order to improve the following deficits and impairments:  Abnormal gait, Decreased range of motion, Difficulty walking, Pain, Decreased activity tolerance, Decreased balance, Decreased strength, Increased edema  Visit Diagnosis: Chronic pain of left knee  Stiffness of left knee, not elsewhere classified  Muscle weakness (generalized)  Other abnormalities of gait and mobility  Localized edema     Problem List Patient Active Problem List   Diagnosis Date Noted  . GAD (generalized anxiety disorder) 09/21/2019  . Pain in left wrist 07/24/2017  . Painful wrist, right 07/24/2017  . Osteoporosis 04/21/2017  . Cervical stenosis of spine 04/21/2017  . Vitamin D deficiency 04/21/2017  . Recurrent major depressive disorder, in partial remission ([place]) 04/09/2017  . Chronic low back pain 04/09/2017  . Fibromyalgia     Gellen April Ma L Nonato PT, DPT 07/19/2020, 10:49 AM   [facility] [address] [place], [place], [address] Phone: [phone]   Fax:  [phone]  Name: Betty Holmes MRN: [id number] Date of Birth: 18-Dec-1956

## 2020-07-21 ENCOUNTER — Telehealth: Payer: Self-pay | Admitting: Nurse Practitioner

## 2020-07-21 NOTE — Telephone Encounter (Signed)
Returned call to patient to schedule AWV

## 2020-07-25 ENCOUNTER — Ambulatory Visit: Payer: Medicare Other | Attending: Specialist | Admitting: Physical Therapy

## 2020-07-25 ENCOUNTER — Encounter: Payer: Self-pay | Admitting: Physical Therapy

## 2020-07-25 ENCOUNTER — Other Ambulatory Visit: Payer: Self-pay

## 2020-07-25 DIAGNOSIS — M25562 Pain in left knee: Secondary | ICD-10-CM | POA: Insufficient documentation

## 2020-07-25 DIAGNOSIS — M6281 Muscle weakness (generalized): Secondary | ICD-10-CM | POA: Diagnosis present

## 2020-07-25 DIAGNOSIS — R2689 Other abnormalities of gait and mobility: Secondary | ICD-10-CM | POA: Diagnosis present

## 2020-07-25 DIAGNOSIS — G8929 Other chronic pain: Secondary | ICD-10-CM

## 2020-07-25 DIAGNOSIS — M25662 Stiffness of left knee, not elsewhere classified: Secondary | ICD-10-CM | POA: Diagnosis present

## 2020-07-25 DIAGNOSIS — R600 Localized edema: Secondary | ICD-10-CM

## 2020-07-25 NOTE — Patient Instructions (Signed)
    Voncille Lo, PT Certified Exercise Expert for the Aging Adult  07/25/20 11:17 AM Phone: [phone] Fax: [phone]

## 2020-07-25 NOTE — Therapy (Addendum)
[place], [place], [address] Phone: [phone]   Fax:  [phone]  Physical Therapy Treatment  Patient Details  Name: Betty Holmes MRN: [id number] Date of Birth: 08-Oct-1956 Referring Provider (PT): Sydnee Cabal, MD   Encounter Date: 07/25/2020   PT End of Session - 07/25/20 1119    Visit Number 9    Number of Visits 16    Date for PT Re-Evaluation 08/17/20    Authorization Type UHC MCR    PT Start Time 1024    PT Stop Time 1119    PT Time Calculation (min) 55 min    Activity Tolerance Patient tolerated treatment well    Behavior During Therapy [facility] for tasks assessed/performed           Past Medical History:  Diagnosis Date  . Anxiety   . Arthritis   . At high risk for tick borne illness    had abx--test her again--she was okey---at [facility]  . Depression   . Fibromyalgia   . Fibromyalgia     Past Surgical History:  Procedure Laterality Date  . CESAREAN SECTION    . CYST EXCISION Right 05/09/2017   Procedure: EXCISION RIGHT INGUINAL CYST;  Surgeon: Clovis Riley, MD;  Location: [facility];  Service: General;  Laterality: Right;  . KNEE ARTHROSCOPY    . KNEE SURGERY    . OVARIAN CYST REMOVAL      There were no vitals filed for this visit.   Subjective Assessment - 07/25/20 1027    Subjective Pt states she took out a stitch that was irritating her LT knee and some pus came out of wound.  (Pt given bandaid to protect opening.It has been 5 weeks since my knee replacement.I went shopping with my husband.  I took the carts after I was up for 3o minutes.  at home every 15 minutes I put keep my knee up    Limitations Sitting;Standing;House hold activities;Walking;Lifting    How long can you sit comfortably? 30 minutes    How long can you stand comfortably? 15 minutes    How long can you walk comfortably? 15 minutes    Patient Stated Goals Get back to  prior level of function    Currently in Pain? Yes    Pain Score 6     Pain Location Knee    Pain Orientation Left    Pain Descriptors / Indicators Aching    Pain Type Surgical pain    Pain Onset More than a month ago   5 weeks from TKA             [facility] PT Assessment - 07/25/20 0001      Assessment   Medical Diagnosis Left TKA    Onset Date/Surgical Date 06/20/20      Observation/Other Assessments   Focus on Therapeutic Outcomes (FOTO)  57% limitation      Single Leg Stance   Comments today SLS with two finger balance on LT leg      ROM / Strength   AROM / PROM / Strength AROM;PROM      AROM   AROM Assessment Site Knee    Right/Left Knee Left    Left Knee Extension -5    Left Knee Flexion 103   standing flexion     PROM   Overall PROM  Deficits    Left Knee Extension -2    Left Knee Flexion 106  [facility] Adult PT Treatment/Exercise - 07/25/20 0001      Self-Care   Self-Care Other Self-Care Comments    Other Self-Care Comments  Pt educated on retrograde massage for LT knee and return demo      Knee/Hip Exercises: Stretches   Active Hamstring Stretch Left;3 reps;30 seconds    Active Hamstring Stretch Limitations do 10 sec quad set with stretch    Hip Flexor Stretch Left;2 reps;30 seconds   off edge of mat table   Knee: Self-Stretch to increase Flexion Left;5 reps;10 seconds    Knee: Self-Stretch Limitations inprone with strap      Knee/Hip Exercises: Aerobic   Nustep L5 x 5 min with UE & LE      Knee/Hip Exercises: Standing   Terminal Knee Extension Strengthening;Left;10 reps    Theraband Level (Terminal Knee Extension) Level 4 (Blue)    Lateral Step Up Both;10 reps;Hand Hold: 2;Step Height: 8"    Forward Step Up Left;10 reps;Hand Hold: 2;Step Height: 8"    Wall Squat 10 reps    Wall Squat Limitations 10 sec hold with green t band above knee for Hip abduction cue    Other Standing Knee Exercises standing on Ther ex with  LT knee and toe tap clock, also on level ground UE support as needed,   hamstring retro walk for knee extension in walking.       Knee/Hip Exercises: Supine   Quad Sets 2 sets;10 reps    Straight Leg Raises Strengthening;2 sets;10 reps      Manual Therapy   Manual Therapy Joint mobilization;Passive ROM    Manual therapy comments retrograde massage and IT band muscle insertions.     Joint Mobilization knee mobs for flex & ext grade III to IVI    Passive ROM knee extension & flexion, ankle DF & PF (especial anterior tib & fibularis)            Access Code: [email or web address]: [email or web address]: 08/03/2021Prepared by: Donnetta Simpers BeardsleyExercises  Added to HEP Hooklying Hamstring Stretch with Strap - 2 x daily - 7 x weekly - 1 sets - 3 reps - 30 hold  Supine ITB Stretch with Strap - 2 x daily - 7 x weekly - 1 sets - 3 reps - 30 hold  Single Leg Stance on Foam Pad - 1 x daily - 7 x weekly - 3 sets - 10 reps  Single Leg Balance with Four Way Reach and Rotation - 1 x daily - 7 x weekly - 3 sets - 10 reps        PT Education - 07/25/20 1141    Education Details educated on self retrograde massage and proprioception of knee, added to HEP    Person(s) Educated Patient    Methods Explanation;Demonstration;Tactile cues;Verbal cues;Handout    Comprehension Verbalized understanding;Returned demonstration            PT Short Term Goals - 07/25/20 1040      PT SHORT TERM GOAL #1   Title Patient will be I with initial HEP to progress in PT.    Baseline uses app for exericises not always consistent    Time 4    Period Weeks    Status Partially Met    Target Date 07/20/20      PT SHORT TERM GOAL #2   Title Patient will be able to ambulate household distances with no AD to improve mobility    Baseline only uses cane in community    Time 4  Period Weeks    Status Achieved      PT SHORT TERM GOAL #3   Title Patient will exhibit improved knee active motion to 0-100 deg  to improve gait mechanics    Baseline 103 degrees  flexion in standing    Time 4    Period Weeks    Status Achieved      PT SHORT TERM GOAL #4   Title Patient will be able to perform SLR without extension lag and sit<>stand without UE support to indicate improved strength    Baseline still with -5 extension lag    Time 4    Period Weeks    Status On-going    Target Date 07/20/20      PT SHORT TERM GOAL #5   Title Patient will report </= 4/10 pain level with activity    Baseline Pt began with 6/10 pain today but improved with movement    Time 4    Status On-going    Target Date 07/20/20             PT Long Term Goals - 07/25/20 1153      PT LONG TERM GOAL #1   Title Patient will be I with final HEP to maintain progress from PT    Baseline working on closed kinetic chain and advanced HEP    Time 8    Period Weeks    Status On-going      PT LONG TERM GOAL #2   Title Patient will exhibit knee AROM >/= 0-120 deg to allow for return to hiking activities    Baseline -5 ext > 103 LT knee AROM    Time 8    Period Weeks    Status On-going      PT LONG TERM GOAL #3   Title Patient will exhibit improved knee strength to >/= 4+/5 MMT and hip strength >/= 4/5 MMT to be able to walk 1 mile with no limitation    Baseline Pt able to walk without cane today    Time 8    Period Weeks    Status Unable to assess      PT LONG TERM GOAL #4   Title Patient will be able perform SLS >/= 20 sec on left to indicate improved balance and stability    Baseline must use UE support    Time 8    Period Weeks    Status On-going      PT LONG TERM GOAL #5   Title Patient will report improved functional level of </= 59% limitation on FOTO    Baseline 07-25-20 57%    Time 8    Period Weeks    Status Achieved                 Plan - 07/25/20 1110    Clinical Impression Statement Pt progressed today for proprioception exercises for LT knee not utilizing cane in home but only for   community outing.   Pt STG acheievd # 1 - # 3  Pt achieved FOTO goal today to 57% limitation and met LTG.  Pt now working on all other LTG's AROM knee ext -5> 103 flexion on LT knee.  Pt is making good progress. Pt pulled out stitch that was causing mild redness and swelling with pus but seems to be improving.  There is still continued edmea along medial knee.  Pt will continue POC    Personal Factors and Comorbidities Past/Current Experience;Fitness  Examination-Activity Limitations Locomotion Level;Transfers;Sleep;Squat;Stairs;Stand;Lift;Dressing;Bed Mobility;Sit    Examination-Participation Restrictions Meal Prep;Cleaning;Community Activity;Driving;Shop;Laundry;Yard Work    PT Frequency 2x / week    PT Duration 8 weeks    PT Treatment/Interventions ADLs/Self Care Home Management;Cryotherapy;Electrical Stimulation;Iontophoresis 14m/ml Dexamethasone;Moist Heat;Neuromuscular re-education;Balance training;Therapeutic exercise;Therapeutic activities;Functional mobility training;Stair training;Gait training;Patient/family education;Manual techniques;Dry needling;Passive range of motion;Taping;Vasopneumatic Device;Joint Manipulations    PT Next Visit Plan Progress note 10th visit and goals. ,Go over FOTO report  continue bike, manual and stretching to improve knee motion, progress quad strengthening/standing, gait training, vaso. Progress squatting and steps. Consider balance/proprioception on next session.    PT Home Exercise Plan [id number]    Consulted and Agree with Plan of Care Patient           Patient will benefit from skilled therapeutic intervention in order to improve the following deficits and impairments:  Abnormal gait, Decreased range of motion, Difficulty walking, Pain, Decreased activity tolerance, Decreased balance, Decreased strength, Increased edema  Visit Diagnosis: Chronic pain of left knee  Stiffness of left knee, not elsewhere classified  Muscle weakness  (generalized)  Other abnormalities of gait and mobility  Localized edema     Problem List Patient Active Problem List   Diagnosis Date Noted  . GAD (generalized anxiety disorder) 09/21/2019  . Pain in left wrist 07/24/2017  . Painful wrist, right 07/24/2017  . Osteoporosis 04/21/2017  . Cervical stenosis of spine 04/21/2017  . Vitamin D deficiency 04/21/2017  . Recurrent major depressive disorder, in partial remission ([place] 04/09/2017  . Chronic low back pain 04/09/2017  . Fibromyalgia     LVoncille Lo PT Certified Exercise Expert for the Aging Adult  07/25/20 12:15 PM Phone: [phone]: 3[facility] [address]Phone: [phone]  Fax:  [phone] Name: CMARCHELLE RINELLAMRN: [id number] of Birth: [birth year]

## 2020-08-01 NOTE — Progress Notes (Signed)
Subjective:   Betty Holmes is a 64 y.o. female who presents for Medicare Annual (Subsequent) preventive examination.  I connected with Carilyn today by telephone and verified that I am speaking with the correct person using two identifiers. Location patient: home Location provider: work Persons participating in the virtual visit: patient, Marine scientist.    I discussed the limitations, risks, security and privacy concerns of performing an evaluation and management service by telephone and the availability of in person appointments. I also discussed with the patient that there may be a patient responsible charge related to this service. The patient expressed understanding and verbally consented to this telephonic visit.    Interactive audio and video telecommunications were attempted between this provider and patient, however failed, due to patient having technical difficulties OR patient did not have access to video capability.  We continued and completed visit with audio only.  Some vital signs may be absent or patient reported.   Time Spent with patient on telephone encounter: 25 minutes  Review of Systems     Cardiac Risk Factors include: obesity (BMI >30kg/m2)     Objective:    Today's Vitals   08/02/20 1458 08/02/20 1459  Weight: 171 lb (77.6 kg)   Height: 5\' 3"  (1.6 m)   PainSc:  6    Body mass index is 30.29 kg/m.  Advanced Directives 08/02/2020 06/22/2020 12/02/2019 07/09/2017 06/05/2017 05/16/2017 05/09/2017  Does Patient Have a Medical Advance Directive? No No No No No No No  Would patient like information on creating a medical advance directive? Yes (MAU/Ambulatory/Procedural Areas - Information given) No - Patient declined No - Patient declined - - No - Patient declined No - Patient declined    Current Medications (verified) Outpatient Encounter Medications as of 08/02/2020  Medication Sig  . acetaminophen (TYLENOL) 325 MG tablet Take 975 mg by mouth 3 (three) times  daily.  Marland Kitchen FLUoxetine (PROZAC) 20 MG capsule Take 1 capsule (20 mg total) by mouth 2 (two) times daily.  . hydrOXYzine (ATARAX/VISTARIL) 25 MG tablet TAKE 1 TABLET (25 MG TOTAL) BY MOUTH DAILY AS NEEDED FOR ANXIETY OR ITCHING.  . nystatin (MYCOSTATIN/NYSTOP) powder Apply 1 application topically 2 (two) times daily.  . Vitamin D, Cholecalciferol, 1000 units TABS Take 1 tablet by mouth 2 times daily at 12 noon and 4 pm.  . diclofenac Sodium (VOLTAREN) 1 % GEL APPLY TO AFFECTED AREA TWICE A DAY FOR 7 DAYS (Patient not taking: Reported on 08/02/2020)  . meloxicam (MOBIC) 7.5 MG tablet Take 7.5 mg by mouth daily. (Patient not taking: Reported on 08/02/2020)  . venlafaxine XR (EFFEXOR XR) 37.5 MG 24 hr capsule Take 1 capsule (37.5 mg total) by mouth daily with breakfast. (Patient not taking: Reported on 05/11/2020)  . [DISCONTINUED] sulfamethoxazole-trimethoprim (BACTRIM DS) 800-160 MG tablet Take 1 tablet by mouth 2 (two) times daily. With food   Facility-Administered Encounter Medications as of 08/02/2020  Medication  . betamethasone acetate-betamethasone sodium phosphate (CELESTONE) injection 3 mg    Allergies (verified) Penicillins and Mineral oil   History: Past Medical History:  Diagnosis Date  . Anxiety   . Arthritis   . At high risk for tick borne illness    had abx--test her again--she was okey---at [facility]  . Depression   . Fibromyalgia   . Fibromyalgia    Past Surgical History:  Procedure Laterality Date  . CESAREAN SECTION    . CYST EXCISION Right 05/09/2017   Procedure: EXCISION RIGHT INGUINAL CYST;  Surgeon:  Clovis Riley, MD;  Location: [facility];  Service: General;  Laterality: Right;  . JOINT REPLACEMENT    . KNEE ARTHROSCOPY    . KNEE SURGERY    . OVARIAN CYST REMOVAL     Family History  Problem Relation Age of Onset  . Alzheimer's disease Mother   . Heart disease Father   . Alzheimer's disease Maternal Aunt   . Alzheimer's disease  Maternal Uncle   . Alzheimer's disease Maternal Grandmother    Social History   Socioeconomic History  . Marital status: Married    Spouse name: Not on file  . Number of children: Not on file  . Years of education: Not on file  . Highest education level: Not on file  Occupational History  . Occupation: Retired  Tobacco Use  . Smoking status: Never Smoker  . Smokeless tobacco: Never Used  Vaping Use  . Vaping Use: Never used  Substance and Sexual Activity  . Alcohol use: No  . Drug use: No  . Sexual activity: Not Currently    Birth control/protection: Post-menopausal  Other Topics Concern  . Not on file  Social History Narrative  . Not on file   Social Determinants of Health   Financial Resource Strain: Low Risk   . Difficulty of Paying Living Expenses: Not hard at all  Food Insecurity: No Food Insecurity  . Worried About Charity fundraiser in the Last Year: Never true  . Ran Out of Food in the Last Year: Never true  Transportation Needs: Unmet Transportation Needs  . Lack of Transportation (Medical): Yes  . Lack of Transportation (Non-Medical): Yes  Physical Activity: Insufficiently Active  . Days of Exercise per Week: 3 days  . Minutes of Exercise per Session: 30 min  Stress: No Stress Concern Present  . Feeling of Stress : Only a little  Social Connections: Socially Integrated  . Frequency of Communication with Friends and Family: More than three times a week  . Frequency of Social Gatherings with Friends and Family: More than three times a week  . Attends Religious Services: More than 4 times per year  . Active Member of Clubs or Organizations: Yes  . Attends Archivist Meetings: More than 4 times per year  . Marital Status: Married    Tobacco Counseling Counseling given: Not Answered   Clinical Intake:  Pre-visit preparation completed: No  Pain : 0-10 Pain Score: 6  Pain Location: Knee Pain Orientation: Left Pain Onset: More than a month  ago Pain Frequency: Constant     Nutritional Status: BMI > 30  Obese Nutritional Risks: None Diabetes: No  How often do you need to have someone help you when you read instructions, pamphlets, or other written materials from your doctor or pharmacy?: 1 - Never What is the last grade level you completed in school?: high school graduate  Diabetic?No  Interpreter Needed?: No  Information entered by :: Caroleen Hamman LPN   Activities of Daily Living In your present state of health, do you have any difficulty performing the following activities: 08/02/2020  Hearing? Y  Vision? N  Difficulty concentrating or making decisions? N  Walking or climbing stairs? N  Dressing or bathing? N  Doing errands, shopping? N  Preparing Food and eating ? N  Using the Toilet? N  In the past six months, have you accidently leaked urine? N  Do you have problems with loss of bowel control? N  Managing your Medications? N  Managing your Finances? N  Housekeeping or managing your Housekeeping? N  Some recent data might be hidden    Patient Care Team: Nche, Charlene Brooke, NP as PCP - General (Internal Medicine)  Indicate any recent Medical Services you may have received from other than [facility] providers in the past year (date may be approximate).     Assessment:   This is a routine wellness examination for Gem.  Hearing/Vision screen  Hearing Screening   125Hz  250Hz  500Hz  1000Hz  2000Hz  3000Hz  4000Hz  6000Hz  8000Hz   Right ear:           Left ear:           Comments: Hearing loss in both ears. Plans to get hearing aids  Vision Screening Comments: Wears Glasses- last eye exam 06/2020-[facility]  Dietary issues and exercise activities discussed: Current Exercise Habits: Home exercise routine, Type of exercise: Other - see comments (swimming), Time (Minutes): 30, Frequency (Times/Week): 3, Weekly Exercise (Minutes/Week): 90, Intensity: Mild  Goals    . Patient Stated     Maintain current  health      Depression Screen PHQ 2/9 Scores 08/02/2020 05/11/2020 01/05/2020 09/21/2019 04/01/2017  PHQ - 2 Score 0 2 6 2 6   PHQ- 9 Score - 8 22 21 13     Fall Risk Fall Risk  08/02/2020 05/11/2020 09/21/2019 04/01/2017  Falls in the past year? 0 0 0 No  Number falls in past yr: 0 0 - -  Injury with Fall? 0 0 - -  Follow up Falls prevention discussed - - -    Any stairs in or around the home? Yes If so, are there any without handrails? Yes  Home free of loose throw rugs in walkways, pet beds, electrical cords, etc? Yes  Adequate lighting in your home to reduce risk of falls? Yes   ASSISTIVE DEVICES UTILIZED TO PREVENT FALLS:  Life alert? No  Use of a cane, walker or w/c? Yes  Grab bars in the bathroom? No  Shower chair or bench in shower? No  Elevated toilet seat or a handicapped toilet? No   TIMED UP AND GO:  Was the test performed? No .  Phone visit   Cognitive Function: No cognitive impairment noted.        Immunizations Immunization History  Administered Date(s) Administered  . PFIZER SARS-COV-2 Vaccination 04/15/2020, 05/06/2020    TDAP status: Up to date per patient  Flu vaccine status: Due 08/2020   Pneumococcal vaccine status: Declined,  Education has been provided regarding the importance of this vaccine but patient still declined. Advised may receive this vaccine at local pharmacy or Health Dept. Aware to provide a copy of the vaccination record if obtained from local pharmacy or Health Dept. Verbalized acceptance and understanding.  Patient to discuss with PCP  Covid-19 vaccine status: Completed vaccines  Qualifies for Shingles Vaccine? Yes   Zostavax completed No   Shingrix Completed?: No.    Education has been provided regarding the importance of this vaccine. Patient has been advised to call insurance company to determine out of pocket expense if they have not yet received this vaccine. Advised may also receive vaccine at local pharmacy or Health Dept.  Verbalized acceptance and understanding.  Screening Tests Health Maintenance  Topic Date Due  . MAMMOGRAM  12/10/2017  . COLON CANCER SCREENING ANNUAL FOBT  11/11/2018  . PNA vac Low Risk Adult (1 of 2 - PCV13) Never done  . INFLUENZA VACCINE  07/23/2020  . PAP SMEAR-Modifier  11/11/2020  . COLONOSCOPY  02/22/2024  . TETANUS/TDAP  04/22/2025  . DEXA SCAN  Completed  . COVID-19 Vaccine  Completed  . Hepatitis C Screening  Completed  . HIV Screening  Completed    Health Maintenance  Health Maintenance Due  Topic Date Due  . MAMMOGRAM  12/10/2017  . COLON CANCER SCREENING ANNUAL FOBT  11/11/2018  . PNA vac Low Risk Adult (1 of 2 - PCV13) Never done  . INFLUENZA VACCINE  07/23/2020    Colorectal cancer screening: Completed 02/21/2014. Repeat every 10 years   Mammogram status: Ordered today. Pt provided with contact info and advised to call to schedule appt.    Bone Density status: Ordered today. Pt provided with contact info and advised to call to schedule appt.   Lung Cancer Screening: (Low Dose CT Chest recommended if Age 70-80 years, 30 pack-year currently smoking OR have quit w/in 15years.) does not qualify.     Additional Screening:  Hepatitis C Screening: Completed 05/01/2017  Vision Screening: Recommended annual ophthalmology exams for early detection of glaucoma and other disorders of the eye. Is the patient up to date with their annual eye exam?  Yes  Who is the provider or what is the name of the office in which the patient attends annual eye exams? [facility]   Dental Screening: Recommended annual dental exams for proper oral hygiene  Community Resource Referral / Chronic Care Management: CRR required this visit?  Yes  For transportation needs.  CCM required this visit?  No      Plan:     I have personally reviewed and noted the following in the patient's chart:   . Medical and social history . Use of alcohol, tobacco or illicit drugs  . Current  medications and supplements . Functional ability and status . Nutritional status . Physical activity . Advanced directives . List of other physicians . Hospitalizations, surgeries, and ER visits in previous 12 months . Vitals . Screenings to include cognitive, depression, and falls . Referrals and appointments  In addition, I have reviewed and discussed with patient certain preventive protocols, quality metrics, and best practice recommendations. A written personalized care plan for preventive services as well as general preventive health recommendations were provided to patient.    Due to this being a telephonic visit, the after visit summary with patients personalized plan was offered to patient via mail or my-chart.  Patient would like to access on my-chart.   Marta Antu, LPN   1/[id number]/4503  Nurse Health Advisor  Nurse Notes: Patient states she is sill having issues with a recurrent rash. Message sent to PCP.

## 2020-08-02 ENCOUNTER — Ambulatory Visit: Payer: Medicare Other

## 2020-08-02 ENCOUNTER — Telehealth: Payer: Self-pay

## 2020-08-02 ENCOUNTER — Ambulatory Visit (INDEPENDENT_AMBULATORY_CARE_PROVIDER_SITE_OTHER): Payer: Medicare Other

## 2020-08-02 ENCOUNTER — Ambulatory Visit: Payer: Medicare Other | Admitting: Physical Therapy

## 2020-08-02 ENCOUNTER — Other Ambulatory Visit: Payer: Self-pay

## 2020-08-02 VITALS — Ht 63.0 in | Wt 171.0 lb

## 2020-08-02 DIAGNOSIS — Z780 Asymptomatic menopausal state: Secondary | ICD-10-CM | POA: Diagnosis not present

## 2020-08-02 DIAGNOSIS — M25562 Pain in left knee: Secondary | ICD-10-CM | POA: Diagnosis not present

## 2020-08-02 DIAGNOSIS — Z0000 Encounter for general adult medical examination without abnormal findings: Secondary | ICD-10-CM

## 2020-08-02 DIAGNOSIS — R600 Localized edema: Secondary | ICD-10-CM

## 2020-08-02 DIAGNOSIS — G8929 Other chronic pain: Secondary | ICD-10-CM

## 2020-08-02 DIAGNOSIS — R2689 Other abnormalities of gait and mobility: Secondary | ICD-10-CM

## 2020-08-02 DIAGNOSIS — M25662 Stiffness of left knee, not elsewhere classified: Secondary | ICD-10-CM

## 2020-08-02 DIAGNOSIS — B372 Candidiasis of skin and nail: Secondary | ICD-10-CM

## 2020-08-02 DIAGNOSIS — Z1231 Encounter for screening mammogram for malignant neoplasm of breast: Secondary | ICD-10-CM

## 2020-08-02 DIAGNOSIS — M6281 Muscle weakness (generalized): Secondary | ICD-10-CM

## 2020-08-02 NOTE — Therapy (Signed)
[place], [place], [address] Phone: [phone]   Fax:  [phone]  Physical Therapy Treatment and Progress Note  Progress Note Reporting Period 06/22/2020 to 08/02/2020  See note below for Objective Data and Assessment of Progress/Goals.     Patient Details  Name: Betty Holmes MRN: [id number] Date of Birth: 01-13-1956 Referring Provider (PT): Sydnee Cabal, MD   Encounter Date: 08/02/2020   PT End of Session - 08/02/20 1009    Visit Number 10    Number of Visits 16    Date for PT Re-Evaluation 08/17/20    Authorization Type [employer] MCR    PT Start Time 1003    PT Stop Time 1050    PT Time Calculation (min) 47 min    Activity Tolerance Patient tolerated treatment well    Behavior During Therapy [facility] for tasks assessed/performed           Past Medical History:  Diagnosis Date  . Anxiety   . Arthritis   . At high risk for tick borne illness    had abx--test her again--she was okey---at [facility]  . Depression   . Fibromyalgia   . Fibromyalgia     Past Surgical History:  Procedure Laterality Date  . CESAREAN SECTION    . CYST EXCISION Right 05/09/2017   Procedure: EXCISION RIGHT INGUINAL CYST;  Surgeon: Clovis Riley, MD;  Location: [place];  Service: General;  Laterality: Right;  . KNEE ARTHROSCOPY    . KNEE SURGERY    . OVARIAN CYST REMOVAL      There were no vitals filed for this visit.   Subjective Assessment - 08/02/20 1005    Subjective Pt reports that her walking is improving but wants to improve well enough to help her older sister. Pt reports continued swelling under her knee cap that remains painful.    Limitations Sitting;Standing;House hold activities;Walking;Lifting    How long can you sit comfortably? 30 minutes    How long can you stand comfortably? 15 minutes    How long can you walk comfortably? 15 minutes    Patient Stated  Goals Get back to prior level of function    Currently in Pain? Yes    Pain Score 4     Pain Location Knee    Pain Orientation Left    Pain Descriptors / Indicators Other (Comment)   Stiff   Pain Type Surgical pain    Pain Onset More than a month ago   5 weeks from TKA                            [facility] Adult PT Treatment/Exercise - 08/02/20 0001      Therapeutic Activites    Therapeutic Activities Other Therapeutic Activities    Other Therapeutic Activities Floor to chair transfer x 3      Knee/Hip Exercises: Stretches   Active Hamstring Stretch Left;3 reps;30 seconds    Active Hamstring Stretch Limitations do 10 sec quad set with stretch    ITB Stretch Left;2 reps;30 seconds    Gastroc Stretch Left;2 reps;30 seconds    Soleus Stretch 2 reps;30 seconds;Left      Knee/Hip Exercises: Aerobic   Recumbent Bike Full revolutions x 5 min      Knee/Hip Exercises: Standing   Lateral Step Up 10 reps;Hand Hold: 2;Step Height: 8";Both    Forward Step Up Left;10 reps;Step  Height: 8";2 sets;Hand Hold: 1    Step Down Left;5 reps;Step Height: 6"    SLS 2x30 sec on L      Knee/Hip Exercises: Supine   Quad Sets Strengthening;Left;5 reps   10 sec hold   Quad Sets Limitations bolster under ankle      Manual Therapy   Manual Therapy Joint mobilization;Passive ROM    Manual therapy comments retrograde massage    Joint Mobilization knee mobs for flex & ext grade III to IVI    Passive ROM knee extension & flexion, ankle DF & PF (especial anterior tib & fibularis)                    PT Short Term Goals - 08/02/20 1242      PT SHORT TERM GOAL #1   Title Patient will be I with initial HEP to progress in PT.    Baseline uses app for exericises not always consistent    Time 4    Period Weeks    Status Partially Met    Target Date 07/20/20      PT SHORT TERM GOAL #2   Title Patient will be able to ambulate household distances with no AD to improve mobility     Baseline only uses cane in community    Time 4    Period Weeks    Status Achieved      PT SHORT TERM GOAL #3   Title Patient will exhibit improved knee active motion to 0-100 deg to improve gait mechanics    Baseline 103 degrees  flexion in standing    Time 4    Period Weeks    Status Achieved      PT SHORT TERM GOAL #4   Title Patient will be able to perform SLR without extension lag and sit<>stand without UE support to indicate improved strength    Baseline still with -5 extension lag    Time 4    Period Weeks    Status On-going    Target Date 07/20/20      PT SHORT TERM GOAL #5   Title Patient will report </= 4/10 pain level with activity    Baseline Pt began with 6/10 pain today but improved with movement    Time 4    Status On-going    Target Date 07/20/20             PT Long Term Goals - 07/25/20 1153      PT LONG TERM GOAL #1   Title Patient will be I with final HEP to maintain progress from PT    Baseline working on closed kinetic chain and advanced HEP    Time 8    Period Weeks    Status On-going      PT LONG TERM GOAL #2   Title Patient will exhibit knee AROM >/= 0-120 deg to allow for return to hiking activities    Baseline -5 ext > 103 LT knee AROM    Time 8    Period Weeks    Status On-going      PT LONG TERM GOAL #3   Title Patient will exhibit improved knee strength to >/= 4+/5 MMT and hip strength >/= 4/5 MMT to be able to walk 1 mile with no limitation    Baseline Pt able to walk without cane today    Time 8    Period Weeks    Status Unable to assess  PT LONG TERM GOAL #4   Title Patient will be able perform SLS >/= 20 sec on left to indicate improved balance and stability    Baseline must use UE support    Time 8    Period Weeks    Status On-going      PT LONG TERM GOAL #5   Title Patient will report improved functional level of </= 59% limitation on FOTO    Baseline 07-25-20 57%    Time 8    Period Weeks    Status Achieved                  Plan - 08/02/20 1028    Clinical Impression Statement 10th visit progress note performed. Treatment focused on functional activities such as steps and floor to sitting transfers. Pt with improving mobility overall. Continued to progress pt's standing balance. Pt still demonstrates mild extensor lag bug is demonstrating improved sit to stand. Pt has good knee AROM from 5 to 102 degrees. Pt has been using no a/d at home but continues to use cane for long distances. Pt is progressing well overall but continues to remain guarded and anxious due to her knee edema.    Personal Factors and Comorbidities Past/Current Experience;Fitness    Examination-Activity Limitations Locomotion Level;Transfers;Sleep;Squat;Stairs;Stand;Lift;Dressing;Bed Mobility;Sit    Examination-Participation Restrictions Meal Prep;Cleaning;Community Activity;Driving;Shop;Laundry;Yard Work    Stability/Clinical Decision Making Stable/Uncomplicated    Rehab Potential Good    PT Frequency 2x / week    PT Duration 8 weeks    PT Treatment/Interventions ADLs/Self Care Home Management;Cryotherapy;Electrical Stimulation;Iontophoresis 40m/ml Dexamethasone;Moist Heat;Neuromuscular re-education;Balance training;Therapeutic exercise;Therapeutic activities;Functional mobility training;Stair training;Gait training;Patient/family education;Manual techniques;Dry needling;Passive range of motion;Taping;Vasopneumatic Device;Joint Manipulations    PT Next Visit Plan Continue bike, manual and stretching to improve knee motion, progress quad strengthening/standing, gait training, vaso. Progress squatting and steps. Consider balance/proprioception on next session.    PT Home Exercise Plan [id number]    Consulted and Agree with Plan of Care Patient           Patient will benefit from skilled therapeutic intervention in order to improve the following deficits and impairments:  Abnormal gait, Decreased range of motion, Difficulty walking,  Pain, Decreased activity tolerance, Decreased balance, Decreased strength, Increased edema  Visit Diagnosis: Chronic pain of left knee  Stiffness of left knee, not elsewhere classified  Muscle weakness (generalized)  Other abnormalities of gait and mobility  Localized edema     Problem List Patient Active Problem List   Diagnosis Date Noted  . GAD (generalized anxiety disorder) 09/21/2019  . Pain in left wrist 07/24/2017  . Painful wrist, right 07/24/2017  . Osteoporosis 04/21/2017  . Cervical stenosis of spine 04/21/2017  . Vitamin D deficiency 04/21/2017  . Recurrent major depressive disorder, in partial remission ([place] 04/09/2017  . Chronic low back pain 04/09/2017  . Fibromyalgia     Gellen April MGordy Levan8/10/2020, 12:50 PM  [facility]GM[address] N[place] [address]Phone: [phone]  Fax:  [phone] Name: Betty KEHMMRN: [id number] of Birth: 1February 02, 1957

## 2020-08-02 NOTE — Patient Instructions (Signed)
Betty Holmes , Thank you for taking time to complete your Medicare Wellness Visit. I appreciate your ongoing commitment to your health goals. Please review the following plan we discussed and let me know if I can assist you in the future.   Screening recommendations/referrals: Colonoscopy: Completed 02/21/2014-Due 02/22/2024 Mammogram: Ordered today. Someone will be calling you to schedule. Bone Density: Ordered today. Someone will be calling you to schedule. Recommended yearly ophthalmology/optometry visit for glaucoma screening and checkup Recommended yearly dental visit for hygiene and checkup  Vaccinations: Influenza vaccine: Due 08/2020 Pneumococcal vaccine: Due- Discuss with PCP at next office visit. Tdap vaccine: Up to date- Due 04/22/2025 Shingles vaccine: Discuss with pharmacy   Covid-19:Completed vaccines  Advanced directives: Discussed. Information mailed today.  Conditions/risks identified: See problem list  Next appointment: Follow up in one year for your annual wellness visit    Preventive Care 65 Years and Older, Female Preventive care refers to lifestyle choices and visits with your health care provider that can promote health and wellness. What does preventive care include?  A yearly physical exam. This is also called an annual well check.  Dental exams once or twice a year.  Routine eye exams. Ask your health care provider how often you should have your eyes checked.  Personal lifestyle choices, including:  Daily care of your teeth and gums.  Regular physical activity.  Eating a healthy diet.  Avoiding tobacco and drug use.  Limiting alcohol use.  Practicing safe sex.  Taking low-dose aspirin every day.  Taking vitamin and mineral supplements as recommended by your health care provider. What happens during an annual well check? The services and screenings done by your health care provider during your annual well check will depend on your age, overall  health, lifestyle risk factors, and family history of disease. Counseling  Your health care provider may ask you questions about your:  Alcohol use.  Tobacco use.  Drug use.  Emotional well-being.  Home and relationship well-being.  Sexual activity.  Eating habits.  History of falls.  Memory and ability to understand (cognition).  Work and work Statistician.  Reproductive health. Screening  You may have the following tests or measurements:  Height, weight, and BMI.  Blood pressure.  Lipid and cholesterol levels. These may be checked every 5 years, or more frequently if you are over 28 years old.  Skin check.  Lung cancer screening. You may have this screening every year starting at age 36 if you have a 30-pack-year history of smoking and currently smoke or have quit within the past 15 years.  Fecal occult blood test (FOBT) of the stool. You may have this test every year starting at age 68.  Flexible sigmoidoscopy or colonoscopy. You may have a sigmoidoscopy every 5 years or a colonoscopy every 10 years starting at age 54.  Hepatitis C blood test.  Hepatitis B blood test.  Sexually transmitted disease (STD) testing.  Diabetes screening. This is done by checking your blood sugar (glucose) after you have not eaten for a while (fasting). You may have this done every 1-3 years.  Bone density scan. This is done to screen for osteoporosis. You may have this done starting at age 29.  Mammogram. This may be done every 1-2 years. Talk to your health care provider about how often you should have regular mammograms. Talk with your health care provider about your test results, treatment options, and if necessary, the need for more tests. Vaccines  Your health care provider may  recommend certain vaccines, such as:  Influenza vaccine. This is recommended every year.  Tetanus, diphtheria, and acellular pertussis (Tdap, Td) vaccine. You may need a Td booster every 10  years.  Zoster vaccine. You may need this after age 19.  Pneumococcal 13-valent conjugate (PCV13) vaccine. One dose is recommended after age 68.  Pneumococcal polysaccharide (PPSV23) vaccine. One dose is recommended after age 60. Talk to your health care provider about which screenings and vaccines you need and how often you need them. This information is not intended to replace advice given to you by your health care provider. Make sure you discuss any questions you have with your health care provider. Document Released: 01/05/2016 Document Revised: 08/28/2016 Document Reviewed: 10/10/2015 Elsevier Interactive Patient Education  2017 [employer] Prevention in the Home Falls can cause injuries. They can happen to people of all ages. There are many things you can do to make your home safe and to help prevent falls. What can I do on the outside of my home?  Regularly fix the edges of walkways and driveways and fix any cracks.  Remove anything that might make you trip as you walk through a door, such as a raised step or threshold.  Trim any bushes or trees on the path to your home.  Use bright outdoor lighting.  Clear any walking paths of anything that might make someone trip, such as rocks or tools.  Regularly check to see if handrails are loose or broken. Make sure that both sides of any steps have handrails.  Any raised decks and porches should have guardrails on the edges.  Have any leaves, snow, or ice cleared regularly.  Use sand or salt on walking paths during winter.  Clean up any spills in your garage right away. This includes oil or grease spills. What can I do in the bathroom?  Use night lights.  Install grab bars by the toilet and in the tub and shower. Do not use towel bars as grab bars.  Use non-skid mats or decals in the tub or shower.  If you need to sit down in the shower, use a plastic, non-slip stool.  Keep the floor dry. Clean up any water that  spills on the floor as soon as it happens.  Remove soap buildup in the tub or shower regularly.  Attach bath mats securely with double-sided non-slip rug tape.  Do not have throw rugs and other things on the floor that can make you trip. What can I do in the bedroom?  Use night lights.  Make sure that you have a light by your bed that is easy to reach.  Do not use any sheets or blankets that are too big for your bed. They should not hang down onto the floor.  Have a firm chair that has side arms. You can use this for support while you get dressed.  Do not have throw rugs and other things on the floor that can make you trip. What can I do in the kitchen?  Clean up any spills right away.  Avoid walking on wet floors.  Keep items that you use a lot in easy-to-reach places.  If you need to reach something above you, use a strong step stool that has a grab bar.  Keep electrical cords out of the way.  Do not use floor polish or wax that makes floors slippery. If you must use wax, use non-skid floor wax.  Do not have throw rugs and  other things on the floor that can make you trip. What can I do with my stairs?  Do not leave any items on the stairs.  Make sure that there are handrails on both sides of the stairs and use them. Fix handrails that are broken or loose. Make sure that handrails are as long as the stairways.  Check any carpeting to make sure that it is firmly attached to the stairs. Fix any carpet that is loose or worn.  Avoid having throw rugs at the top or bottom of the stairs. If you do have throw rugs, attach them to the floor with carpet tape.  Make sure that you have a light switch at the top of the stairs and the bottom of the stairs. If you do not have them, ask someone to add them for you. What else can I do to help prevent falls?  Wear shoes that:  Do not have high heels.  Have rubber bottoms.  Are comfortable and fit you well.  Are closed at the  toe. Do not wear sandals.  If you use a stepladder:  Make sure that it is fully opened. Do not climb a closed stepladder.  Make sure that both sides of the stepladder are locked into place.  Ask someone to hold it for you, if possible.  Clearly mark and make sure that you can see:  Any grab bars or handrails.  First and last steps.  Where the edge of each step is.  Use tools that help you move around (mobility aids) if they are needed. These include:  Canes.  Walkers.  Scooters.  Crutches.  Turn on the lights when you go into a dark area. Replace any light bulbs as soon as they burn out.  Set up your furniture so you have a clear path. Avoid moving your furniture around.  If any of your floors are uneven, fix them.  If there are any pets around you, be aware of where they are.  Review your medicines with your doctor. Some medicines can make you feel dizzy. This can increase your chance of falling. Ask your doctor what other things that you can do to help prevent falls. This information is not intended to replace advice given to you by your health care provider. Make sure you discuss any questions you have with your health care provider. Document Released: 10/05/2009 Document Revised: 05/16/2016 Document Reviewed: 01/13/2015 Elsevier Interactive Patient Education  2017 [employer].

## 2020-08-03 MED ORDER — MICONAZOLE NITRATE 2 % EX POWD: Freq: Two times a day (BID) | CUTANEOUS | 0 refills | Status: DC | PRN

## 2020-08-03 MED ORDER — FLUCONAZOLE 200 MG PO TABS: 200.0000 mg | ORAL_TABLET | Freq: Once | ORAL | 0 refills | Status: AC

## 2020-08-03 NOTE — Telephone Encounter (Signed)
Miconazole powder sent and diflucan tab If no improvement, she will need referral to dermatology

## 2020-08-03 NOTE — Telephone Encounter (Signed)
Patient notified

## 2020-08-08 ENCOUNTER — Other Ambulatory Visit: Payer: Self-pay

## 2020-08-08 ENCOUNTER — Ambulatory Visit: Payer: Medicare Other | Admitting: Physical Therapy

## 2020-08-08 ENCOUNTER — Encounter: Payer: Self-pay | Admitting: Physical Therapy

## 2020-08-08 DIAGNOSIS — M25562 Pain in left knee: Secondary | ICD-10-CM

## 2020-08-08 DIAGNOSIS — R600 Localized edema: Secondary | ICD-10-CM

## 2020-08-08 DIAGNOSIS — R2689 Other abnormalities of gait and mobility: Secondary | ICD-10-CM

## 2020-08-08 DIAGNOSIS — M6281 Muscle weakness (generalized): Secondary | ICD-10-CM

## 2020-08-08 DIAGNOSIS — G8929 Other chronic pain: Secondary | ICD-10-CM

## 2020-08-08 DIAGNOSIS — M25662 Stiffness of left knee, not elsewhere classified: Secondary | ICD-10-CM

## 2020-08-08 NOTE — Patient Instructions (Signed)
    Voncille Lo, PT Certified Exercise Expert for the Aging Adult  08/08/20 10:23 AM Phone: [phone] Fax: [phone]

## 2020-08-08 NOTE — Therapy (Signed)
[place], [place], [address] Phone: [phone]   Fax:  [phone]  Physical Therapy Treatment  Patient Details  Name: Betty Holmes MRN: [id number] Date of Birth: Aug 05, 1956 Referring Provider (PT): Sydnee Cabal, MD   Encounter Date: 08/08/2020   PT End of Session - 08/08/20 0939    Visit Number 11    Number of Visits 16    Date for PT Re-Evaluation 08/17/20    PT Start Time 0936    PT Stop Time 1017    PT Time Calculation (min) 41 min    Activity Tolerance Patient tolerated treatment well    Behavior During Therapy [facility] for tasks assessed/performed           Past Medical History:  Diagnosis Date  . Anxiety   . Arthritis   . At high risk for tick borne illness    had abx--test her again--she was okey---at [facility]  . Depression   . Fibromyalgia   . Fibromyalgia     Past Surgical History:  Procedure Laterality Date  . CESAREAN SECTION    . CYST EXCISION Right 05/09/2017   Procedure: EXCISION RIGHT INGUINAL CYST;  Surgeon: Clovis Riley, MD;  Location: [facility];  Service: General;  Laterality: Right;  . JOINT REPLACEMENT    . KNEE ARTHROSCOPY    . KNEE SURGERY    . OVARIAN CYST REMOVAL      There were no vitals filed for this visit.   Subjective Assessment - 08/08/20 0941    Subjective I have been walking a lot and I am having pain over my knee and over the lateral side( fibular head) on LT lower leg.    Limitations Sitting;Standing;House hold activities;Walking;Lifting    Patient Stated Goals Get back to prior level of function    Currently in Pain? Yes    Pain Score 5     Pain Location Knee    Pain Orientation Left    Pain Descriptors / Indicators --   stifff   Pain Type Surgical pain    Pain Onset More than a month ago    Pain Frequency Constant                             [facility] Adult PT Treatment/Exercise -  08/08/20 0001      Knee/Hip Exercises: Stretches   Active Hamstring Stretch Left;3 reps;30 seconds    ITB Stretch Left;2 reps;30 seconds    Gastroc Stretch Left;2 reps;30 seconds    Soleus Stretch 2 reps;30 seconds;Left      Knee/Hip Exercises: Standing   Lateral Step Up 10 reps;Hand Hold: 2;Step Height: 8";Both    Forward Step Up Left;10 reps;Step Height: 8";2 sets;Hand Hold: 1    Other Standing Knee Exercises using pink foam roller on back , wall slide 1/2 range hold 10 sec x 10 black t band around knee standing sideway monster walk to RT and LT 10 ft (total 2 x) then 3 x with green t band      Knee/Hip Exercises: Prone   Hip Extension Strengthening;Left;1 set;10 reps    Other Prone Exercises prone hip extension  ham curl toward ceilin x 10      Manual Therapy   Manual Therapy Joint mobilization;Passive ROM    Manual therapy comments retrograde massage prone knee/quad stretch  contract relax    Joint Mobilization knee mobs for flex &  ext grade III to IVI. LT fibular head AP 3/4 grade    Passive ROM knee extension & flexion, ankle DF & PF (especial anterior tib & fibularis) PROM of hips in all planes                  PT Education - 08/08/20 0944    Education Details went over FOTO report added standing exercises to HEP    Person(s) Educated Patient    Methods Explanation;Demonstration;Tactile cues;Verbal cues;Handout    Comprehension Verbalized understanding;Returned demonstration            PT Short Term Goals - 08/02/20 1242      PT SHORT TERM GOAL #1   Title Patient will be I with initial HEP to progress in PT.    Baseline uses app for exericises not always consistent    Time 4    Period Weeks    Status Partially Met    Target Date 07/20/20      PT SHORT TERM GOAL #2   Title Patient will be able to ambulate household distances with no AD to improve mobility    Baseline only uses cane in community    Time 4    Period Weeks    Status Achieved      PT  SHORT TERM GOAL #3   Title Patient will exhibit improved knee active motion to 0-100 deg to improve gait mechanics    Baseline 103 degrees  flexion in standing    Time 4    Period Weeks    Status Achieved      PT SHORT TERM GOAL #4   Title Patient will be able to perform SLR without extension lag and sit<>stand without UE support to indicate improved strength    Baseline still with -5 extension lag    Time 4    Period Weeks    Status On-going    Target Date 07/20/20      PT SHORT TERM GOAL #5   Title Patient will report </= 4/10 pain level with activity    Baseline Pt began with 6/10 pain today but improved with movement    Time 4    Status On-going    Target Date 07/20/20             PT Long Term Goals - 07/25/20 1153      PT LONG TERM GOAL #1   Title Patient will be I with final HEP to maintain progress from PT    Baseline working on closed kinetic chain and advanced HEP    Time 8    Period Weeks    Status On-going      PT LONG TERM GOAL #2   Title Patient will exhibit knee AROM >/= 0-120 deg to allow for return to hiking activities    Baseline -5 ext > 103 LT knee AROM    Time 8    Period Weeks    Status On-going      PT LONG TERM GOAL #3   Title Patient will exhibit improved knee strength to >/= 4+/5 MMT and hip strength >/= 4/5 MMT to be able to walk 1 mile with no limitation    Baseline Pt able to walk without cane today    Time 8    Period Weeks    Status Unable to assess      PT LONG TERM GOAL #4   Title Patient will be able perform SLS >/= 20 sec on left  to indicate improved balance and stability    Baseline must use UE support    Time 8    Period Weeks    Status On-going      PT LONG TERM GOAL #5   Title Patient will report improved functional level of </= 59% limitation on FOTO    Baseline 07-25-20 57%    Time 8    Period Weeks    Status Achieved             Access Code: [email or web address]: [email or web address]:  08/17/2021Prepared by: Donnetta Simpers BeardsleyExercises   Wall Sit - 1 x daily - 7 x weekly - 1 sets - 10 reps - 10 hold  Band Walks - 1 x daily - 7 x weekly - 5-10 reps      Plan - 08/08/20 0957    Clinical Impression Statement Pt returns with pain in knee and 5/10 pain level which improves with movment and STW/Manual therapy. Pt improves with movement and standing exercises added.  Pt with scar tissue from previous surgeries.  Pt did not bring AD to the clinic and is working on overall better stability and balance and challenging limits of motion.  Will continue  POC    Personal Factors and Comorbidities Past/Current Experience;Fitness    Examination-Activity Limitations Locomotion Level;Transfers;Sleep;Squat;Stairs;Stand;Lift;Dressing;Bed Mobility;Sit    Examination-Participation Restrictions Meal Prep;Cleaning;Community Activity;Driving;Shop;Laundry;[employer] Work    PT Treatment/Interventions ADLs/Self Care Home Management;Cryotherapy;Electrical Stimulation;Iontophoresis 24m/ml Dexamethasone;Moist Heat;Neuromuscular re-education;Balance training;Therapeutic exercise;Therapeutic activities;Functional mobility training;Stair training;Gait training;Patient/family education;Manual techniques;Dry needling;Passive range of motion;Taping;Vasopneumatic Device;Joint Manipulations    PT Next Visit Plan Continue bike, manual and stretching to improve knee motion, progress quad strengthening/standing, gait training, vaso. Progress squatting and steps. Consider balance/proprioception on next session  GOALS next visit.    PT Home Exercise Plan [id number]    Consulted and Agree with Plan of Care Patient           Patient will benefit from skilled therapeutic intervention in order to improve the following deficits and impairments:  Abnormal gait, Decreased range of motion, Difficulty walking, Pain, Decreased activity tolerance, Decreased balance, Decreased strength, Increased edema  Visit Diagnosis: Chronic pain of  left knee  Stiffness of left knee, not elsewhere classified  Muscle weakness (generalized)  Other abnormalities of gait and mobility  Localized edema     Problem List Patient Active Problem List   Diagnosis Date Noted  . GAD (generalized anxiety disorder) 09/21/2019  . Pain in left wrist 07/24/2017  . Painful wrist, right 07/24/2017  . Osteoporosis 04/21/2017  . Cervical stenosis of spine 04/21/2017  . Vitamin D deficiency 04/21/2017  . Recurrent major depressive disorder, in partial remission ([place] 04/09/2017  . Chronic low back pain 04/09/2017  . Fibromyalgia     LVoncille Lo PT Certified Exercise Expert for the Aging Adult  08/08/20 12:36 PM Phone: [phone]: 3[place] [id number] [address]Phone: [phone]  Fax:  [phone] Name: Betty BORDASMRN: [id number] of Birth: 1June 01, 1957

## 2020-08-10 ENCOUNTER — Ambulatory Visit: Payer: Medicare Other | Admitting: Physical Therapy

## 2020-08-10 ENCOUNTER — Other Ambulatory Visit: Payer: Self-pay

## 2020-08-10 ENCOUNTER — Encounter: Payer: Self-pay | Admitting: Physical Therapy

## 2020-08-10 DIAGNOSIS — G8929 Other chronic pain: Secondary | ICD-10-CM

## 2020-08-10 DIAGNOSIS — M25662 Stiffness of left knee, not elsewhere classified: Secondary | ICD-10-CM

## 2020-08-10 DIAGNOSIS — R2689 Other abnormalities of gait and mobility: Secondary | ICD-10-CM

## 2020-08-10 DIAGNOSIS — M25562 Pain in left knee: Secondary | ICD-10-CM

## 2020-08-10 DIAGNOSIS — M6281 Muscle weakness (generalized): Secondary | ICD-10-CM

## 2020-08-10 DIAGNOSIS — R600 Localized edema: Secondary | ICD-10-CM

## 2020-08-10 NOTE — Therapy (Signed)
[place], [place], [address] Phone: [phone]   Fax:  [phone]  Physical Therapy Treatment  Patient Details  Name: Betty Holmes MRN: [id number] Date of Birth: July 21, 1956 Referring Provider (PT): Sydnee Cabal, MD   Encounter Date: 08/10/2020   PT End of Session - 08/10/20 0939    Visit Number 12    Number of Visits 16    Date for PT Re-Evaluation 08/17/20    Authorization Type [employer] MCR    PT Start Time 0847    PT Stop Time 0940    PT Time Calculation (min) 53 min    Activity Tolerance Patient tolerated treatment well    Behavior During Therapy [facility] for tasks assessed/performed           Past Medical History:  Diagnosis Date  . Anxiety   . Arthritis   . At high risk for tick borne illness    had abx--test her again--she was okey---at [facility]  . Depression   . Fibromyalgia   . Fibromyalgia     Past Surgical History:  Procedure Laterality Date  . CESAREAN SECTION    . CYST EXCISION Right 05/09/2017   Procedure: EXCISION RIGHT INGUINAL CYST;  Surgeon: Clovis Riley, MD;  Location: [facility];  Service: General;  Laterality: Right;  . JOINT REPLACEMENT    . KNEE ARTHROSCOPY    . KNEE SURGERY    . OVARIAN CYST REMOVAL      There were no vitals filed for this visit.   Subjective Assessment - 08/10/20 0901    Subjective I could not sleep last night    Limitations Sitting;Standing;House hold activities;Walking;Lifting    Patient Stated Goals Get back to prior level of function    Currently in Pain? Yes    Pain Score 7     Pain Location Knee    Pain Orientation Left    Pain Descriptors / Indicators Sharp;Shooting;Sore    Pain Type Surgical pain    Pain Onset More than a month ago    Pain Frequency Constant              [facility] PT Assessment - 08/10/20 0001      Assessment   Medical Diagnosis Left TKA    Onset Date/Surgical Date  06/20/20      Single Leg Stance   Comments SLS 13 sec on LT      ROM / Strength   AROM / PROM / Strength AROM      AROM   AROM Assessment Site Knee    Right/Left Knee Left    Left Knee Extension -3    Left Knee Flexion 118   supine                        [facility] Adult PT Treatment/Exercise - 08/10/20 0001      Knee/Hip Exercises: Stretches   ITB Stretch Left;2 reps;30 seconds    Gastroc Stretch Right;Left;3 reps;30 seconds    Gastroc Stretch Limitations standing with hands against wall    Soleus Stretch 2 reps;30 seconds;Left      Knee/Hip Exercises: Supine   Straight Leg Raises Strengthening;Left;2 sets;10 reps      Modalities   Modalities Moist Heat      Moist Heat Therapy   Number Minutes Moist Heat 10 Minutes    Moist Heat Location Knee   LT     Manual Therapy  Manual Therapy Joint mobilization;Soft tissue mobilization;Passive ROM    Manual therapy comments retrograde massage prone knee/quad stretch  contract relax    Joint Mobilization knee mobs for flex & ext grade III to IVI. LT fibular head AP 3/4 grade    Soft tissue mobilization IASTYM to LT hamstring, LT knee , IT band connectiions    Passive ROM knee extension & flexion, ankle DF & PF (especial anterior tib & fibularis) PROM of hips in all planes            Trigger Point Dry Needling - 08/10/20 0001    Consent Given? Yes    Education Handout Provided Yes    Muscles Treated Lower Quadrant Rectus femoris;Vastus lateralis;Hamstring    Other Dry Needling 40 mm .30 gage    Rectus femoris Response Twitch response elicited;Palpable increased muscle length    Vastus lateralis Response Twitch response elicited;Palpable increased muscle length    Hamstring Response Twitch response elicited;Palpable increased muscle length                PT Education - 08/10/20 1002    Education Details Pt educated on TPDN and aftercare and precautians    Person(s) Educated Patient    Methods Explanation     Comprehension Verbalized understanding            PT Short Term Goals - 08/02/20 1242      PT SHORT TERM GOAL #1   Title Patient will be I with initial HEP to progress in PT.    Baseline uses app for exericises not always consistent    Time 4    Period Weeks    Status Partially Met    Target Date 07/20/20      PT SHORT TERM GOAL #2   Title Patient will be able to ambulate household distances with no AD to improve mobility    Baseline only uses cane in community    Time 4    Period Weeks    Status Achieved      PT SHORT TERM GOAL #3   Title Patient will exhibit improved knee active motion to 0-100 deg to improve gait mechanics    Baseline 103 degrees  flexion in standing    Time 4    Period Weeks    Status Achieved      PT SHORT TERM GOAL #4   Title Patient will be able to perform SLR without extension lag and sit<>stand without UE support to indicate improved strength    Baseline still with -5 extension lag    Time 4    Period Weeks    Status On-going    Target Date 07/20/20      PT SHORT TERM GOAL #5   Title Patient will report </= 4/10 pain level with activity    Baseline Pt began with 6/10 pain today but improved with movement    Time 4    Status On-going    Target Date 07/20/20             PT Long Term Goals - 08/10/20 0932      PT LONG TERM GOAL #1   Title Patient will be I with final HEP to maintain progress from PT    Baseline working on closed kinetic chain and advanced HEP    Time 8    Period Weeks    Status On-going    Target Date 08/17/20      PT LONG TERM GOAL #2  Title Patient will exhibit knee AROM >/= 0-120 deg to allow for return to hiking activities    Baseline -3 ext > 118 LT knee AROM supine    Time 8    Period Weeks    Status On-going    Target Date 08/17/20      PT LONG TERM GOAL #3   Title Patient will exhibit improved knee strength to >/= 4+/5 MMT and hip strength >/= 4/5 MMT to be able to walk 1 mile with no limitation     Baseline Pt able to walk without cane today but with 7/10    Time 8    Period Weeks    Status On-going      PT LONG TERM GOAL #4   Title Patient will be able perform SLS >/= 20 sec on left to indicate improved balance and stability    Baseline able to stand for 13 sec    Time 8    Period Weeks    Status On-going      PT LONG TERM GOAL #5   Title Patient will report improved functional level of </= 59% limitation on FOTO    Baseline 07-25-20 57%    Time 8    Period Weeks    Status Achieved                 Plan - 08/10/20 1006    Clinical Impression Statement Pt returns with 7/10 level and consents to TPDN to LT knee/hamstring/quad with decrease in pain and decreased tissue tension.  Pt has supine AROM ext/flex  -3 to 118 this visit, but pain with extending knee.  Will continue POC and assess TPDN,  Pt was closely monitored and pt reported decreased pain after TPDN  PT able to SLS 13 sec after RX    Personal Factors and Comorbidities Past/Current Experience;Fitness    Examination-Activity Limitations Locomotion Level;Transfers;Sleep;Squat;Stairs;Stand;Lift;Dressing;Bed Mobility;Sit    Examination-Participation Restrictions Meal Prep;Cleaning;Community Activity;Driving;Shop;Laundry;Yard Work    PT Frequency 2x / week    PT Duration 8 weeks    PT Treatment/Interventions ADLs/Self Care Home Management;Cryotherapy;Electrical Stimulation;Iontophoresis 26m/ml Dexamethasone;Moist Heat;Neuromuscular re-education;Balance training;Therapeutic exercise;Therapeutic activities;Functional mobility training;Stair training;Gait training;Patient/family education;Manual techniques;Dry needling;Passive range of motion;Taping;Vasopneumatic Device;Joint Manipulations    PT Next Visit Plan Continue bike, manual and stretching to improve knee motion, progress quad strengthening/standing, gait training, vaso. Progress squatting and steps. Consider balance/proprioception on next session. add gastroc stretch     PT Home Exercise Plan [email or web address]    Consulted and Agree with Plan of Care Patient           Patient will benefit from skilled therapeutic intervention in order to improve the following deficits and impairments:  Abnormal gait, Decreased range of motion, Difficulty walking, Pain, Decreased activity tolerance, Decreased balance, Decreased strength, Increased edema  Visit Diagnosis: Chronic pain of left knee  Stiffness of left knee, not elsewhere classified  Muscle weakness (generalized)  Other abnormalities of gait and mobility  Localized edema     Problem List Patient Active Problem List   Diagnosis Date Noted  . GAD (generalized anxiety disorder) 09/21/2019  . Pain in left wrist 07/24/2017  . Painful wrist, right 07/24/2017  . Osteoporosis 04/21/2017  . Cervical stenosis of spine 04/21/2017  . Vitamin D deficiency 04/21/2017  . Recurrent major depressive disorder, in partial remission ([place] 04/09/[place]  . Chronic low back pain 04/09/2017  . Fibromyalgia     LVoncille Lo PT Certified Exercise Expert for the Aging Adult  08/10/20 10:11 AM  Phone: [phone] Fax: [place] [facility] [address] [place], [place], [address] Phone: [phone]   Fax:  [phone]  Name: Betty Holmes MRN: [id number] Date of Birth: 02-24-1956

## 2020-08-10 NOTE — Patient Instructions (Signed)
Trigger Point Dry Needling  . What is Trigger Point Dry Needling (DN)? o DN is a physical therapy technique used to treat muscle pain and dysfunction. Specifically, DN helps deactivate muscle trigger points (muscle knots).  o A thin filiform needle is used to penetrate the skin and stimulate the underlying trigger point. The goal is for a local twitch response (LTR) to occur and for the trigger point to relax. No medication of any kind is injected during the procedure.   . What Does Trigger Point Dry Needling Feel Like?  o The procedure feels different for each individual patient. Some patients report that they do not actually feel the needle enter the skin and overall the process is not painful. Very mild bleeding may occur. However, many patients feel a deep cramping in the muscle in which the needle was inserted. This is the local twitch response.   Marland Kitchen How Will I feel after the treatment? o Soreness is normal, and the onset of soreness may not occur for a few hours. Typically this soreness does not last longer than two days.  o Bruising is uncommon, however; ice can be used to decrease any possible bruising.  o In rare cases feeling tired or nauseous after the treatment is normal. In addition, your symptoms may get worse before they get better, this period will typically not last longer than 24 hours.   . What Can I do After My Treatment? o Increase your hydration by drinking more water for the next 24 hours. o You may place ice or heat on the areas treated that have become sore, however, do not use heat on inflamed or bruised areas. Heat often brings more relief post needling. o You can continue your regular activities, but vigorous activity is not recommended initially after the treatment for 24 hours. o DN is best combined with other physical therapy such as strengthening, stretching, and other therapies.   Voncille Lo, PT Certified Exercise Expert for the Aging Adult  08/10/20 8:55  AM Phone: [phone] Fax: [phone]

## 2020-08-15 ENCOUNTER — Other Ambulatory Visit: Payer: Self-pay

## 2020-08-15 ENCOUNTER — Ambulatory Visit: Payer: Medicare Other | Admitting: Physical Therapy

## 2020-08-15 DIAGNOSIS — M6281 Muscle weakness (generalized): Secondary | ICD-10-CM

## 2020-08-15 DIAGNOSIS — M25662 Stiffness of left knee, not elsewhere classified: Secondary | ICD-10-CM

## 2020-08-15 DIAGNOSIS — R600 Localized edema: Secondary | ICD-10-CM

## 2020-08-15 DIAGNOSIS — R2689 Other abnormalities of gait and mobility: Secondary | ICD-10-CM

## 2020-08-15 DIAGNOSIS — M25562 Pain in left knee: Secondary | ICD-10-CM

## 2020-08-15 DIAGNOSIS — G8929 Other chronic pain: Secondary | ICD-10-CM

## 2020-08-15 NOTE — Patient Instructions (Signed)
   Voncille Lo, PT Certified Exercise Expert for the Aging Adult  08/15/20 11:10 AM Phone: [phone] Fax: [phone]

## 2020-08-15 NOTE — Therapy (Signed)
[place], [place], [address] Phone: [phone]   Fax:  [phone]  Physical Therapy Treatment/ERO  Patient Details  Name: Betty Holmes MRN: [id number] Date of Birth: 1956-08-24 Referring Provider (PT): Sydnee Cabal, MD   Encounter Date: 08/15/2020   PT End of Session - 08/15/20 1053    Visit Number 13    Number of Visits 19    Date for PT Re-Evaluation 09/26/20    Authorization Type UHC MCR    PT Start Time 1017    PT Stop Time 1105    PT Time Calculation (min) 48 min    Activity Tolerance Patient tolerated treatment well    Behavior During Therapy [facility] for tasks assessed/performed           Past Medical History:  Diagnosis Date  . Anxiety   . Arthritis   . At high risk for tick borne illness    had abx--test her again--she was okey---at [facility]  . Depression   . Fibromyalgia   . Fibromyalgia     Past Surgical History:  Procedure Laterality Date  . CESAREAN SECTION    . CYST EXCISION Right 05/09/2017   Procedure: EXCISION RIGHT INGUINAL CYST;  Surgeon: Clovis Riley, MD;  Location: [place];  Service: General;  Laterality: Right;  . JOINT REPLACEMENT    . KNEE ARTHROSCOPY    . KNEE SURGERY    . OVARIAN CYST REMOVAL      There were no vitals filed for this visit.   Subjective Assessment - 08/15/20 1031    Subjective I am not sleeping well because I am cramping so much in my leg.    Limitations Sitting;Standing;House hold activities;Walking;Lifting    How long can you sit comfortably? 30 minutes    How long can you stand comfortably? 15 minutes    How long can you walk comfortably? 15 minutes    Patient Stated Goals Get back to prior level of function    Currently in Pain? Yes    Pain Score 5     Pain Location Knee   especially IT Band insertion and fat pad of knee   Pain Orientation Left    Pain Descriptors / Indicators  Sharp;Shooting;Sore    Pain Type Surgical pain;Chronic pain    Pain Onset More than a month ago    Pain Frequency Constant    Aggravating Factors  standing, walking, bending, sitting too long, stairs    Pain Relieving Factors rest, medication, ice, elevation              [facility] PT Assessment - 08/15/20 0001      Assessment   Medical Diagnosis Left TKA    Referring Provider (PT) Sydnee Cabal, MD    Onset Date/Surgical Date 06/20/20      Observation/Other Assessments   Focus on Therapeutic Outcomes (FOTO)  57% limitation      Functional Tests   Functional tests Squat      Squat   Comments Pt unable to box squat to chair without pain in LT knee/ IT band tract      Single Leg Stance   Comments SLS 15sec on LT      ROM / Strength   AROM / PROM / Strength AROM      AROM   AROM Assessment Site Knee    Right/Left Knee Left    Left Knee Extension -1    Left Knee Flexion  118   supine/pain on end range     PROM   Left Knee Extension +2    Left Knee Flexion 122      Strength   Overall Strength Deficits    Right Hip Flexion 4+/5    Right Hip Extension 4/5    Right Hip ABduction 4-/5    Left Hip Flexion 3+/5    Left Hip Extension 3-/5    Left Hip ABduction 3+/5    Right Knee Flexion 4+/5    Right Knee Extension 4+/5    Left Knee Flexion 4-/5    Left Knee Extension 4-/5   good quad contraction     Palpation   Patella mobility Pt patellar mobiity improved in side to side but needs additional work in  Inf/sup directions    Palpation comment TTP over IT Band insertions and fat pad of knee, over rectus femoris, vastus latrealis,  Pt also with irritation from fat pad taping and removed ,  K T tape for edema replaced for swelling in knee with former  good result.                          [facility] Adult PT Treatment/Exercise - 08/15/20 0001      Ambulation/Gait   Assistive device None    Gait Pattern Step-through pattern    Ambulation Surface Level     Stairs Yes    Stairs Assistance 7: Independent    Stair Management Technique One rail Right;Alternating pattern    Number of Stairs 24   12 wtih fat pad taping and decreased pain   Gait Comments pt enters clinic using a cane but does not utilize in  clinic.  Pt walking over unlevel and level surfaces.   Pt knee with decreased edema but does seam to increase with use during RX time.       Knee/Hip Exercises: Stretches   ITB Stretch Left;3 reps;30 seconds   against wall added to HEP   ITB Stretch Limitations with yoga strap 3 x 30 sec on  LT    Gastroc Stretch Right;Left;3 reps;30 seconds    Gastroc Stretch Limitations standing with hands against wall    Soleus Stretch 2 reps;30 seconds;Left      Manual Therapy   Manual Therapy Joint mobilization;Soft tissue mobilization;Passive ROM    Manual therapy comments retrograde massage prone knee/quad stretch  contract relax    Joint Mobilization knee mobs for flex & ext grade III to IVI. LT fibular head AP 3/4 grade    Soft tissue mobilization STW to LT hamstring, LT knee , IT band connectiions    Kinesiotex Edema;Create Space      Kinesiotix   Edema educating pt on edema basketweave KT tape for reducing edema    Create Space attempted knee fat pad for pain with steps.  Pt improved abilty to walk and go up and down steps. but taple caused a reaction even with pre wrap so tape was removed.              Trigger Point Dry Needling - 08/15/20 0001    Consent Given? Yes    Education Handout Provided Previously provided    Muscles Treated Lower Quadrant Rectus femoris;Vastus lateralis;Hamstring    Other Dry Needling 40 mm .30 gage    Rectus femoris Response Twitch response elicited;Palpable increased muscle length    Vastus lateralis Response Twitch response elicited;Palpable increased muscle length    Hamstring Response  Twitch response elicited;Palpable increased muscle length                PT Education - 08/15/20 1110    Education  Details added IT band strech with HEP and provided self taping education    Person(s) Educated Patient    Methods Explanation;Demonstration;Tactile cues;Verbal cues;Handout    Comprehension Verbalized understanding;Returned demonstration            PT Short Term Goals - 08/15/20 1129      PT SHORT TERM GOAL #1   Title Patient will be I with initial HEP to progress in PT.    Baseline uses app for exericises not always consistent    Time 4    Period Weeks    Status Achieved      PT SHORT TERM GOAL #2   Title Patient will be able to ambulate household distances with no AD to improve mobility    Baseline only uses cane in community    Time 4    Period Weeks    Status Achieved      PT SHORT TERM GOAL #3   Title Patient will exhibit improved knee active motion to 0-100 deg to improve gait mechanics    Baseline 118 AROM flexion    Time 4    Period Weeks    Status Achieved      PT SHORT TERM GOAL #4   Title Patient will be able to perform SLR without extension lag and sit<>stand without UE support to indicate improved strength    Time 4    Period Weeks    Status Achieved             PT Long Term Goals - 08/15/20 1054      PT LONG TERM GOAL #1   Title Patient will be I with final HEP to maintain progress from PT    Baseline working on closed kinetic chain and advanced HEP    Time 6    Period Weeks    Status On-going    Target Date 09/26/20      PT LONG TERM GOAL #2   Title Patient will exhibit knee AROM >/= 0-120 deg to allow for return to hiking activities    Baseline -3 ext > 118 LT knee AROM supine    Time 6    Period Weeks    Status On-going    Target Date 09/26/20      PT LONG TERM GOAL #3   Title Patient will exhibit improved knee strength to >/= 4+/5 MMT and hip strength >/= 4/5 MMT to be able to walk 1 mile with no limitation    Baseline Pt able to walk 1/4 mile  now pain 5/10  SEE flow chart.  LT LE 3+ to 4-/5 MMT    Time 6    Period Weeks    Status  On-going      PT LONG TERM GOAL #4   Title Patient will be able perform SLS >/= 20 sec on left to indicate improved balance and stability    Baseline able to stand on LT for 15 sec    Time 6    Period Weeks    Status On-going      PT LONG TERM GOAL #5   Title Patient will report improved functional level of </= 59% limitation on FOTO    Baseline 07-25-20 57%    Time 8    Period Weeks    Status Achieved  Target Date 09/26/20      Additional Long Term Goals   Additional Long Term Goals Yes      PT LONG TERM GOAL #6   Title Pt will be able to return to a community level fitness/ program before DC    Baseline Pt only able to walk 1/4 mile at this time    Time 6    Period Weeks    Status New    Target Date 09/19/20                 Plan - 08/15/20 1137    Clinical Impression Statement Pt returns with 5/10 level pain with pain with stairs and walking over IT band insertion sites especially distally.  Pt has improved AROM and MMT but has not completed LTG's.  Pt still has edema around knee and tried fat pad of knee taping but developed a reaction to the tape and was removed although it did improve her ability to walk and ascend/descend steps.   Pt would benefit from additional 6 visits to reinforce HEP and to find a community wellness opportunity to continue strengthening post DC.  Pt left today with KT tape for edema control in preparation for her trip to [facility].  Pt AROM -3 ext to 118 for LT knee.  Pt will benefit from 1 x a week for addtional 6 weeks.    Personal Factors and Comorbidities Past/Current Experience;Fitness    Examination-Activity Limitations Locomotion Level;Transfers;Sleep;Squat;Stairs;Stand;Lift;Dressing;Bed Mobility;Sit    Examination-Participation Restrictions Meal Prep;Cleaning;Community Activity;Driving;Shop;Laundry;Yard Work    Stability/Clinical Decision Making Stable/Uncomplicated    Clinical Decision Making Low    Rehab Potential Good    PT Frequency 1x /  week    PT Duration 6 weeks    PT Treatment/Interventions ADLs/Self Care Home Management;Cryotherapy;Electrical Stimulation;Iontophoresis 4mg /ml Dexamethasone;Moist Heat;Neuromuscular re-education;Balance training;Therapeutic exercise;Therapeutic activities;Functional mobility training;Stair training;Gait training;Patient/family education;Manual techniques;Dry needling;Passive range of motion;Taping;Vasopneumatic Device;Joint Manipulations    PT Next Visit Plan Continue bike, manual and stretching to improve knee motion, progress quad strengthening/standing, gait training, vaso. Progress squatting and steps. Consider balance/proprioception on next session. add gastroc stretch    PT Home Exercise Plan [id number]  [id number]    Consulted and Agree with Plan of Care Patient           Patient will benefit from skilled therapeutic intervention in order to improve the following deficits and impairments:  Abnormal gait, Decreased range of motion, Difficulty walking, Pain, Decreased activity tolerance, Decreased balance, Decreased strength, Increased edema  Visit Diagnosis: Chronic pain of left knee  Stiffness of left knee, not elsewhere classified  Muscle weakness (generalized)  Other abnormalities of gait and mobility  Localized edema     Problem List Patient Active Problem List   Diagnosis Date Noted  . GAD (generalized anxiety disorder) 09/21/2019  . Pain in left wrist 07/24/2017  . Painful wrist, right 07/24/2017  . Osteoporosis 04/21/2017  . Cervical stenosis of spine 04/21/2017  . Vitamin D deficiency 04/21/2017  . Recurrent major depressive disorder, in partial remission ([place]) 04/09/2017  . Chronic low back pain 04/09/2017  . Fibromyalgia     Voncille Lo, PT Certified Exercise Expert for the Aging Adult  08/15/20 11:46 AM Phone: [phone] Fax: [place] [facility] [address] [place], [place], [address]  Phone: [phone]   Fax:  [phone]  Name: Betty Holmes MRN: [id number] Date of Birth: [birth year]

## 2020-08-23 ENCOUNTER — Ambulatory Visit: Payer: Medicare Other | Admitting: Physical Therapy

## 2020-08-25 ENCOUNTER — Telehealth: Payer: Self-pay | Admitting: Nurse Practitioner

## 2020-08-25 NOTE — Telephone Encounter (Signed)
Betty Holmes 08/25/2020 Called pt regarding community resource referral received. Left message for pt to call me back, my info is [phone] please see ref notes for more details.  [place], Care Management

## 2020-08-29 ENCOUNTER — Other Ambulatory Visit: Payer: Self-pay

## 2020-08-29 ENCOUNTER — Ambulatory Visit: Payer: Medicare Other | Attending: Specialist | Admitting: Physical Therapy

## 2020-08-29 ENCOUNTER — Encounter: Payer: Self-pay | Admitting: Physical Therapy

## 2020-08-29 DIAGNOSIS — R600 Localized edema: Secondary | ICD-10-CM | POA: Diagnosis not present

## 2020-08-29 DIAGNOSIS — M6281 Muscle weakness (generalized): Secondary | ICD-10-CM | POA: Diagnosis not present

## 2020-08-29 DIAGNOSIS — R2689 Other abnormalities of gait and mobility: Secondary | ICD-10-CM | POA: Diagnosis not present

## 2020-08-29 DIAGNOSIS — G8929 Other chronic pain: Secondary | ICD-10-CM | POA: Diagnosis not present

## 2020-08-29 DIAGNOSIS — M25662 Stiffness of left knee, not elsewhere classified: Secondary | ICD-10-CM | POA: Diagnosis not present

## 2020-08-29 DIAGNOSIS — M25562 Pain in left knee: Secondary | ICD-10-CM | POA: Diagnosis not present

## 2020-08-29 NOTE — Therapy (Signed)
[facility] [address] [place], [place], [address] Phone: [phone]   Fax:  [phone]  Physical Therapy Treatment/Discharge Note  Patient Details  Name: Betty Holmes MRN: [id number] Date of Birth: 07/27/1956 Referring Provider (PT): Collins, Robert, MD   Encounter Date: 08/29/2020   PT End of Session - 08/29/20 1612    Visit Number 14    Number of Visits 19    Date for PT Re-Evaluation 09/26/20    Authorization Type UHC MCR    PT Start Time 1345    PT Stop Time 1445    PT Time Calculation (min) 60 min    Activity Tolerance Patient tolerated treatment well    Behavior During Therapy WFL for tasks assessed/performed           Past Medical History:  Diagnosis Date  . Anxiety   . Arthritis   . At high risk for tick borne illness    had abx--test her again--she was okey---at [facility]  . Depression   . Fibromyalgia   . Fibromyalgia     Past Surgical History:  Procedure Laterality Date  . CESAREAN SECTION    . CYST EXCISION Right 05/09/2017   Procedure: EXCISION RIGHT INGUINAL CYST;  Surgeon: Connor, Chelsea A, MD;  Location: [facility];  Service: General;  Laterality: Right;  . JOINT REPLACEMENT    . KNEE ARTHROSCOPY    . KNEE SURGERY    . OVARIAN CYST REMOVAL      There were no vitals filed for this visit.   Subjective Assessment - 08/29/20 1548    Subjective I had a 10 hour ride in car to [place].  When I got back I fell off my bed while putting up my curtains. I fell 2 feet backwards and I also hit my head. about a week ago.  I think I am doing ok.  My LT IT band really hurts.  I am planning to go to [place] to help my sister and so I need to go ahead and discharge. I plan to go to the [employer] to work on water exercise. I can walk a mile   I am going to walk now to my home about a mile away. I appreciate everything you have done for me    Limitations Sitting;Standing;House hold  activities;Walking;Lifting    How long can you sit comfortably? 30-45 minutes    How long can you stand comfortably? 15- 30 minutes    How long can you walk comfortably? 15  to 30 minutes   I go slow    Patient Stated Goals Get back to prior level of function    Currently in Pain? Yes    Pain Score 5     Pain Location Knee    Pain Orientation Left    Pain Descriptors / Indicators Sharp;Shooting;Sore    Pain Type Surgical pain;Chronic pain    Pain Onset More than a month ago    Pain Frequency Constant              [facility] PT Assessment - 08/29/20 0001      Assessment   Medical Diagnosis Left TKA    Referring Provider (PT) Collins, Robert, MD    Onset Date/Surgical Date 06/20/20      Observation/Other Assessments   Focus on Therapeutic Outcomes (FOTO)  43% limitation      Functional Tests   Functional tests Squat      Squat     Comments pt able to box squat with 15 # KB      Single Leg Stance   Comments SLS 16.67 sec      ROM / Strength   AROM / PROM / Strength AROM      AROM   AROM Assessment Site Knee    Right/Left Knee Left    Left Knee Extension -1    Left Knee Flexion 120   supine/pain on end range     PROM   Left Knee Extension +2    Left Knee Flexion 122      Strength   Overall Strength Deficits    Right Hip Flexion 4+/5    Right Hip Extension 4/5    Right Hip ABduction 4/5    Left Hip Flexion 4/5    Left Hip Extension 4/5    Left Hip ABduction 4/5    Right Knee Flexion 4+/5    Right Knee Extension 4+/5    Left Knee Flexion 4/5    Left Knee Extension 4/5   good quad contraction     Palpation   Patella mobility Pt with improved patelllar mobiity in all planes    Palpation comment TTP over IT Band insertions and fat pad of knee, over rectus femoris, vastus lateralis  Pt does improve with stretching and exercises      Ambulation/Gait   Gait Comments Pt bring cane but does not use in clinic                         [facility] Adult PT  Treatment/Exercise - 08/29/20 0001      Self-Care   Self-Care Other Self-Care Comments    Other Self-Care Comments  community wellness opportuities and benefits of water exercise      Knee/Hip Exercises: Stretches   ITB Stretch Left;3 reps;30 seconds    ITB Stretch Limitations with yoga strap 3 x 30 sec on  LT    Gastroc Stretch Right;Left;3 reps;30 seconds    Gastroc Stretch Limitations standing with hands against wall      Knee/Hip Exercises: Standing   Lateral Step Up Left;2 sets;10 reps;Hand Hold: 1    Forward Step Up Left;2 sets;10 reps;Hand Hold: 1    Other Standing Knee Exercises reverse lunge LT 1 x 10 then with t band medial to lateral pull with decreased pain 1 x 10      Other Standing Knee Exercises 15 # KB box squat x 10   cushion in chair to add height     Knee/Hip Exercises: Supine   Quad Sets Strengthening;Left;2 sets;10 reps    Straight Leg Raises Strengthening;Left;1 set;10 reps   no extension lag     Moist Heat Therapy   Number Minutes Moist Heat 15 Minutes    Moist Heat Location Knee   LT     Manual Therapy   Manual Therapy Joint mobilization;Soft tissue mobilization;Passive ROM    Manual therapy comments retrograde massage prone knee/quad stretch  contract relax    Joint Mobilization knee mobs for flex & ext grade III to IVI. LT fibular head AP 3/4 grade    Soft tissue mobilization STW to LT hamstring, LT knee , IT band connectiions            Trigger Point Dry Needling - 08/29/20 0001    Consent Given? Yes    Education Handout Provided Previously provided    Muscles Treated Lower Quadrant Rectus femoris;Vastus lateralis;Vastus medialis      Other Dry Needling 40 mm .30 gage    Rectus femoris Response Twitch response elicited;Palpable increased muscle length    Vastus lateralis Response Twitch response elicited;Palpable increased muscle length    Vastus medialis Response Palpable increased muscle length    Hamstring Response Twitch response  elicited;Palpable increased muscle length                PT Education - 08/29/20 1648    Education Details added reverse lunge, box squat  and reinforced HEP given    Person(s) Educated Patient    Methods Explanation;Demonstration    Comprehension Verbalized understanding;Returned demonstration            PT Short Term Goals - 08/29/20 1648      PT SHORT TERM GOAL #1   Title Patient will be I with initial HEP to progress in PT.    Baseline uses app for exercises and will go to the [employer]    Time 4    Period Weeks    Status Achieved    Target Date 07/20/20      PT SHORT TERM GOAL #2   Title Patient will be able to ambulate household distances with no AD to improve mobility    Baseline only uses cane in community    Time 4    Period Weeks    Status Achieved    Target Date 07/20/20      PT SHORT TERM GOAL #3   Title Patient will exhibit improved knee active motion to 0-100 deg to improve gait mechanics    Baseline 118 AROM flexion take 2 weeks ago    Time 4    Period Weeks    Status Achieved      PT SHORT TERM GOAL #4   Title Patient will be able to perform SLR without extension lag and sit<>stand without UE support to indicate improved strength    Baseline no extension lag now    Time 4    Period Weeks    Status Achieved    Target Date 07/20/20      PT SHORT TERM GOAL #5   Title Patient will report </= 4/10 pain level with activity    Baseline Pt 4/10 today after exercise    Time 4    Status Achieved    Target Date 07/20/20             PT Long Term Goals - 08/29/20 1636      PT LONG TERM GOAL #1   Title Patient will be I with final HEP to maintain progress from PT    Baseline Pt able to be independent with HEP given    Time 6    Period Weeks    Status Achieved      PT LONG TERM GOAL #2   Title Patient will exhibit knee AROM >/= 0-120 deg to allow for return to hiking activities    Baseline -1 ext  120 AROM flexion  125 PROM    Time 6    Period  Weeks    Status Achieved      PT LONG TERM GOAL #3   Title Patient will exhibit improved knee strength to >/= 4+/5 MMT and hip strength >/= 4/5 MMT to be able to walk 1 mile with no limitation    Baseline Pt able to walk home 1 mile but pain 4/10 post TPDN  LT LE 4/5 to 4+/5    Period Weeks    Status Partially   Met      PT LONG TERM GOAL #4   Title Patient will be able perform SLS >/= 20 sec on left to indicate improved balance and stability    Baseline able to stand 16. 67 sec    Time 6    Period Weeks    Status Partially Met      PT LONG TERM GOAL #5   Title Patient will report improved functional level of </= 59% limitation on FOTO    Baseline 08-29-20  43%    Time 8    Period Weeks    Status Achieved      PT LONG TERM GOAL #6   Title Pt will be able to return to a community level fitness/ program before DC    Baseline Pt able to walk home about 1 mile and will return to [facility] for water exercises    Time 6    Period Weeks    Status Achieved                 Plan - 08/29/20 1707    Clinical Impression Statement Pt returns with 5/10 pain in LT knee and also complaint from fall from last week when she was on bed and putting up frame and fell back but she is improving and she is concerned aobut knee pain/IT band pain.  Pt realizes with HEP review that she does not do all the stretches taught to her at clinic and at end of session she was 4/10 pain.   Ms Hakim consented to 3rd TPDN and was closely monitored and benefitedd from STW.   Pt SLS on LT 16 .67 sec ,but her AROM has improved to 120 flex and -1 ext.  She would greatly benefit from water exercise and she has access to [facility].  Ms [facility] requests DC and will be travelling to [place] to care for  sister. She then left clinic to walk 1 mile home.  FOTO improved from 79% limitation to 43% today. All LTG partially met/achieved.  will DC as per pt request.    Personal Factors and Comorbidities Past/Current Experience;Fitness     Examination-Activity Limitations Locomotion Level;Transfers;Sleep;Squat;Stairs;Stand;Lift;Dressing;Bed Mobility;Sit    Examination-Participation Restrictions Meal Prep;Cleaning;Community Activity;Driving;Shop;Laundry;Yard Work    PT Frequency 1x / week    PT Duration 6 weeks    PT Treatment/Interventions ADLs/Self Care Home Management;Cryotherapy;Electrical Stimulation;Iontophoresis 5m/ml Dexamethasone;Moist Heat;Neuromuscular re-education;Balance training;Therapeutic exercise;Therapeutic activities;Functional mobility training;Stair training;Gait training;Patient/family education;Manual techniques;Dry needling;Passive range of motion;Taping;Vasopneumatic Device;Joint Manipulations    PT Next Visit Plan DC as per pt request    PT Home Exercise Plan [id number]  [id number]    Consulted and Agree with Plan of Care Patient           Patient will benefit from skilled therapeutic intervention in order to improve the following deficits and impairments:  Abnormal gait, Decreased range of motion, Difficulty walking, Pain, Decreased activity tolerance, Decreased balance, Decreased strength, Increased edema  Visit Diagnosis: Chronic pain of left knee  Stiffness of left knee, not elsewhere classified  Muscle weakness (generalized)  Other abnormalities of gait and mobility  Localized edema     Problem List Patient Active Problem List   Diagnosis Date Noted  . GAD (generalized anxiety disorder) 09/21/2019  . Pain in left wrist 07/24/2017  . Painful wrist, right 07/24/2017  . Osteoporosis 04/21/2017  . Cervical stenosis of spine 04/21/2017  . Vitamin D deficiency 04/21/2017  . Recurrent major depressive disorder, in partial remission ([place] 04/09/2017  .  Chronic low back pain 04/09/2017  . Fibromyalgia     Lawrie Beardsley, PT [facility] Certified Exercise Expert for the Aging Adult  08/29/20 5:17 PM Phone: [phone] Fax: [phone]  [facility] [address] [place], [place], [address] Phone: [phone]   Fax:  [phone]  Name: Betty Holmes MRN: [id number] Date of Birth: 03/06/1956   PHYSICAL THERAPY DISCHARGE SUMMARY  Visits from Start of Care: 14  Current functional level related to goals / functional outcomes: As above   Remaining deficits: As above  4/10 pain in LT knee fat pad IT band   Education / Equipment: HEP  Education on community wellness Plan: Patient agrees to discharge.  Patient goals were partially met. Patient is being discharged due to the patient's request.  ?????    Pt requests to DC to care for sister in [place]  Lawrie Beardsley, PT Certified Exercise Expert for the Aging Adult  08/29/20 5:19 PM Phone: [phone] Fax: [phone]   

## 2020-09-20 DIAGNOSIS — Z4789 Encounter for other orthopedic aftercare: Secondary | ICD-10-CM | POA: Diagnosis not present

## 2020-09-27 ENCOUNTER — Other Ambulatory Visit: Payer: Self-pay

## 2020-09-27 ENCOUNTER — Ambulatory Visit
Admission: RE | Admit: 2020-09-27 | Discharge: 2020-09-27 | Disposition: A | Discharge status: Home / Self Care | Payer: Medicare Other | Source: Ambulatory Visit | Attending: Nurse Practitioner | Admitting: Nurse Practitioner

## 2020-09-27 DIAGNOSIS — Z1231 Encounter for screening mammogram for malignant neoplasm of breast: Secondary | ICD-10-CM | POA: Diagnosis not present

## 2020-09-27 DIAGNOSIS — M25661 Stiffness of right knee, not elsewhere classified: Secondary | ICD-10-CM | POA: Diagnosis not present

## 2020-10-02 DIAGNOSIS — M25661 Stiffness of right knee, not elsewhere classified: Secondary | ICD-10-CM | POA: Diagnosis not present

## 2020-10-06 DIAGNOSIS — M25661 Stiffness of right knee, not elsewhere classified: Secondary | ICD-10-CM | POA: Diagnosis not present

## 2020-10-10 DIAGNOSIS — M25662 Stiffness of left knee, not elsewhere classified: Secondary | ICD-10-CM | POA: Diagnosis not present

## 2020-10-13 DIAGNOSIS — M25661 Stiffness of right knee, not elsewhere classified: Secondary | ICD-10-CM | POA: Diagnosis not present

## 2020-10-17 DIAGNOSIS — M25662 Stiffness of left knee, not elsewhere classified: Secondary | ICD-10-CM | POA: Diagnosis not present

## 2020-10-19 DIAGNOSIS — M25662 Stiffness of left knee, not elsewhere classified: Secondary | ICD-10-CM | POA: Diagnosis not present

## 2020-10-25 DIAGNOSIS — Z96652 Presence of left artificial knee joint: Secondary | ICD-10-CM | POA: Diagnosis not present

## 2020-10-25 DIAGNOSIS — Z471 Aftercare following joint replacement surgery: Secondary | ICD-10-CM | POA: Diagnosis not present

## 2020-12-26 ENCOUNTER — Other Ambulatory Visit: Payer: Self-pay | Admitting: Nurse Practitioner

## 2020-12-26 DIAGNOSIS — F332 Major depressive disorder, recurrent severe without psychotic features: Secondary | ICD-10-CM

## 2020-12-27 NOTE — Telephone Encounter (Signed)
Last fill 01/05/20  #180/3 Last OV 05/11/20

## 2021-01-16 ENCOUNTER — Other Ambulatory Visit: Payer: Self-pay | Admitting: Nurse Practitioner

## 2021-01-16 DIAGNOSIS — B372 Candidiasis of skin and nail: Secondary | ICD-10-CM

## 2021-02-13 ENCOUNTER — Encounter: Payer: Self-pay | Admitting: Nurse Practitioner

## 2021-02-14 ENCOUNTER — Telehealth: Payer: Medicare Other | Admitting: Family

## 2021-02-14 DIAGNOSIS — R197 Diarrhea, unspecified: Secondary | ICD-10-CM | POA: Diagnosis not present

## 2021-02-14 NOTE — Progress Notes (Signed)
We are sorry that you are not feeling well.  Here is how we plan to help!  Based on what you have shared with me it looks like you have diarrhea.   I recommend starting daily fiber as this will bulk your stool and you can also take imodium daily or prior to stressful situations. I do recommend following with with GI specialists if this continues. You may also want to see your PCP about your anxiety.    HOME CARE  We recommend changing your diet to help with your symptoms for the next few days.  Drink plenty of fluids that contain water salt and sugar. Sports drinks such as Gatorade may help.   You may try broths, soups, bananas, applesauce, soft breads, mashed potatoes or crackers.   You are considered infectious for as long as the diarrhea continues. Hand washing or use of alcohol based hand sanitizers is recommend.  It is best to stay out of work or school until your symptoms stop.   GET HELP RIGHT AWAY  If you have dark yellow colored urine or do not pass urine frequently you should drink more fluids.    If your symptoms worsen   If you feel like you are going to pass out (faint)  You have a new problem  MAKE SURE YOU   Understand these instructions.  Will watch your condition.  Will get help right away if you are not doing well or get worse.  Your e-visit answers were reviewed by a board certified advanced clinical practitioner to complete your personal care plan.  Depending on the condition, your plan could have included both over the counter or prescription medications.  If there is a problem please reply  once you have received a response from your provider.  Your safety is important to [place].  If you have drug allergies check your prescription carefully.    You can use MyChart to ask questions about today's visit, request a non-urgent call back, or ask for a work or school excuse for 24 hours related to this e-Visit. If it has been greater than 24 hours you will need  to follow up with your provider, or enter a new e-Visit to address those concerns.   You will get an e-mail in the next two days asking about your experience.  I hope that your e-visit has been valuable and will speed your recovery. Thank you for using e-visits.  Approximately 5 minutes was spent documenting and reviewing patient's chart.

## 2021-03-03 ENCOUNTER — Telehealth: Payer: Medicare Other | Admitting: Nurse Practitioner

## 2021-03-03 DIAGNOSIS — R197 Diarrhea, unspecified: Secondary | ICD-10-CM | POA: Diagnosis not present

## 2021-03-03 NOTE — Progress Notes (Signed)
We are sorry that you are not feeling well.  Here is how we plan to help!  Based on what you have shared with me it looks like you have Acute Infectious Diarrhea.  Most cases of acute diarrhea are due to infections with virus and bacteria and are self-limited conditions lasting less than 14 days.  For your symptoms you may take Imodium 2 mg tablets that are over the counter at your local pharmacy. Take two tablet now and then one after each loose stool up to 6 a day.  Antibiotics are not needed for most people with diarrhea.   * because this has been going on so long you need  To make an appoikintment to have some test run to see what can be causing this recurrent diarrhea. There are many things that can be done once they determine the cause.   HOME CARE  We recommend changing your diet to help with your symptoms for the next few days.  Drink plenty of fluids that contain water salt and sugar. Sports drinks such as Gatorade may help.   You may try broths, soups, bananas, applesauce, soft breads, mashed potatoes or crackers.   You are considered infectious for as long as the diarrhea continues. Hand washing or use of alcohol based hand sanitizers is recommend.  It is best to stay out of work or school until your symptoms stop.   GET HELP RIGHT AWAY  If you have dark yellow colored urine or do not pass urine frequently you should drink more fluids.    If your symptoms worsen   If you feel like you are going to pass out (faint)  You have a new problem  MAKE SURE YOU   Understand these instructions.  Will watch your condition.  Will get help right away if you are not doing well or get worse.  Your e-visit answers were reviewed by a board certified advanced clinical practitioner to complete your personal care plan.  Depending on the condition, your plan could have included both over the counter or prescription medications.  If there is a problem please reply  once you have  received a response from your provider.  Your safety is important to [place].  If you have drug allergies check your prescription carefully.    You can use MyChart to ask questions about today's visit, request a non-urgent call back, or ask for a work or school excuse for 24 hours related to this e-Visit. If it has been greater than 24 hours you will need to follow up with your provider, or enter a new e-Visit to address those concerns.   You will get an e-mail in the next two days asking about your experience.  I hope that your e-visit has been valuable and will speed your recovery. Thank you for using e-visits.  5-10 minutes spent reviewing and documenting in chart.

## 2021-03-20 ENCOUNTER — Other Ambulatory Visit: Payer: Self-pay | Admitting: Nurse Practitioner

## 2021-03-20 DIAGNOSIS — F332 Major depressive disorder, recurrent severe without psychotic features: Secondary | ICD-10-CM

## 2021-03-21 NOTE — Telephone Encounter (Signed)
Called and left detailed message to call the office and schedule an appointment before next refills are due (per [facility]).

## 2021-03-24 IMAGING — MG DIGITAL SCREENING BILAT W/ TOMO W/ CAD
8 series · 8 of 24 positions shown · non-contrast
Comparison: Previous exam(s).

CLINICAL DATA: Screening.

EXAM:
DIGITAL SCREENING BILATERAL MAMMOGRAM WITH TOMO AND CAD

[R CC synth-2D]
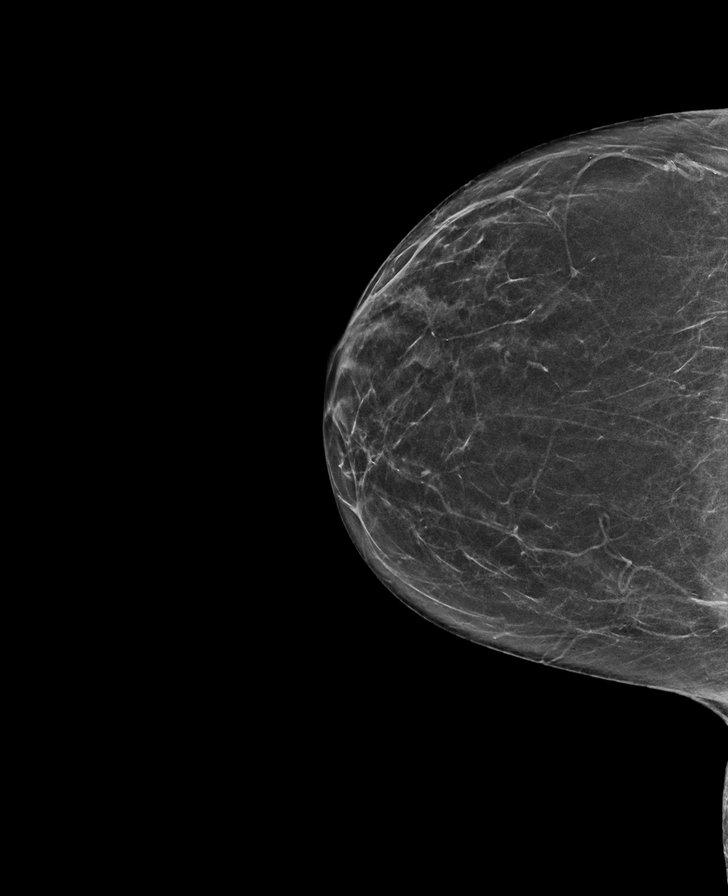

[R MLO synth-2D]
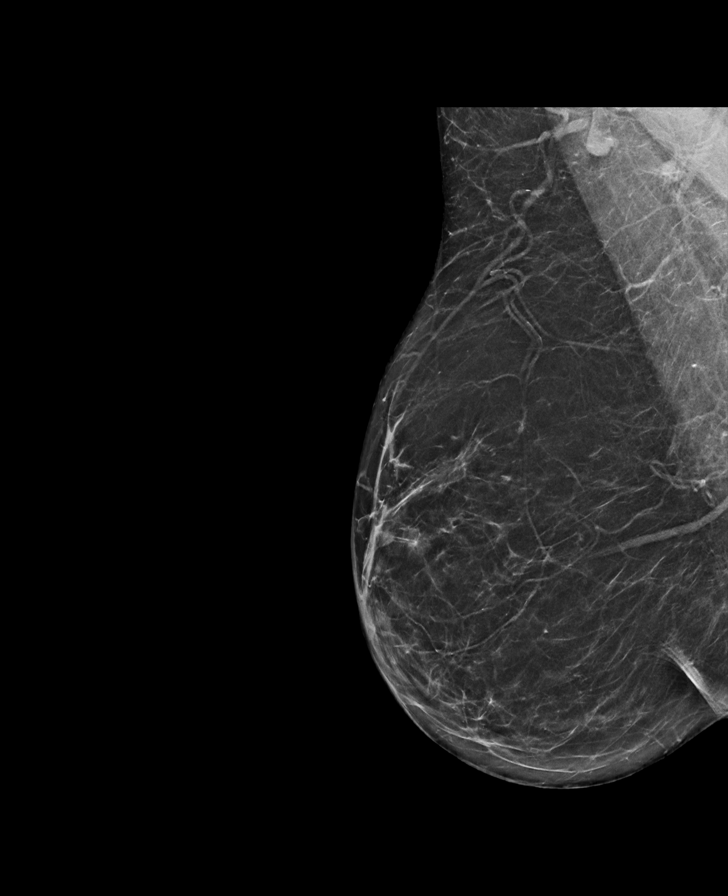

[L MLO synth-2D]
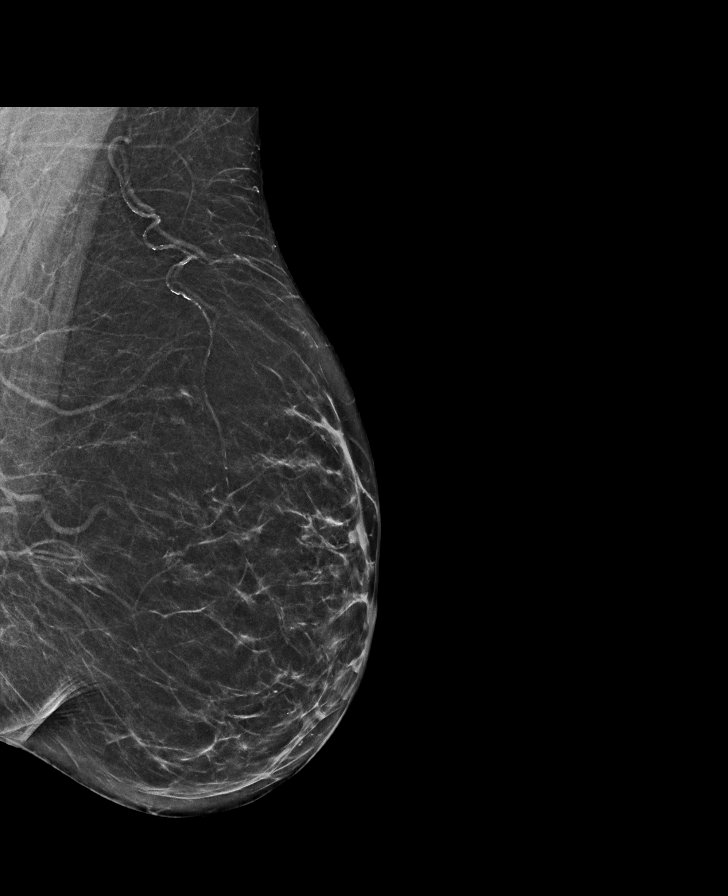

[L CC synth-2D]
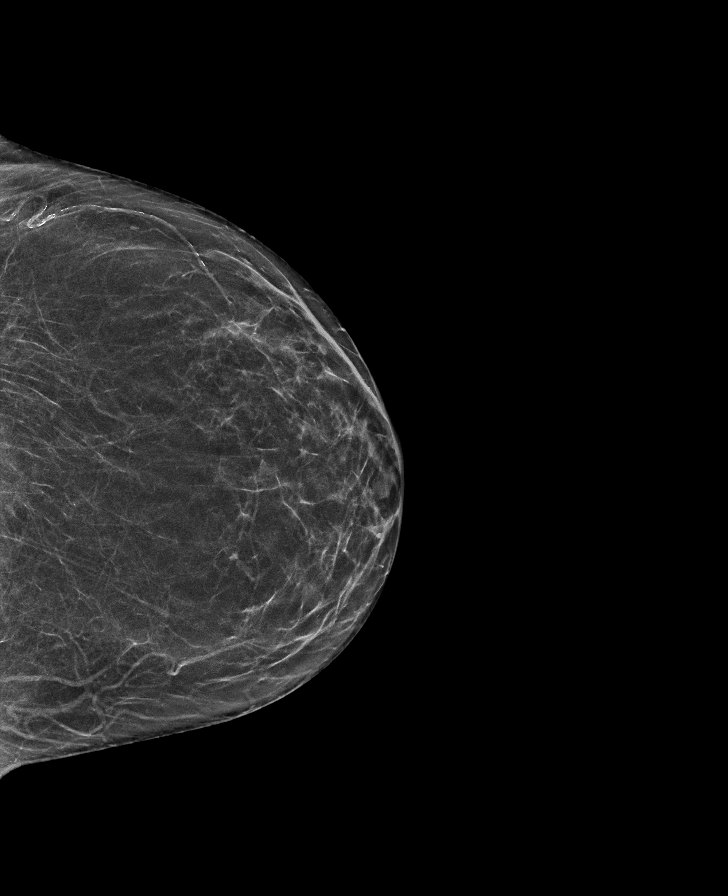

[R MLO tomo · tomo slice 33/65.0]
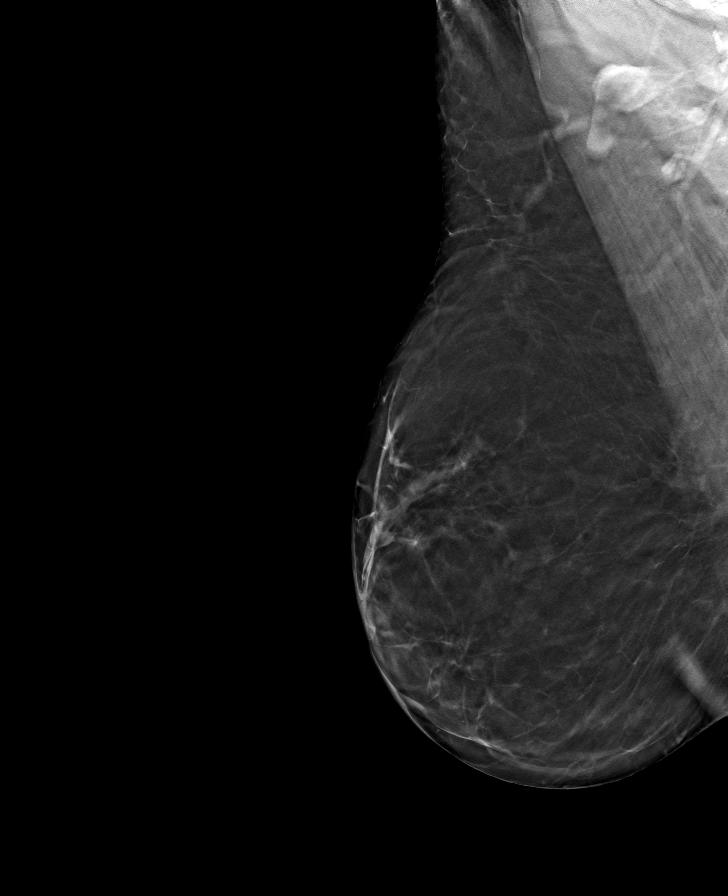

[L CC tomo · tomo slice 29/58.0]
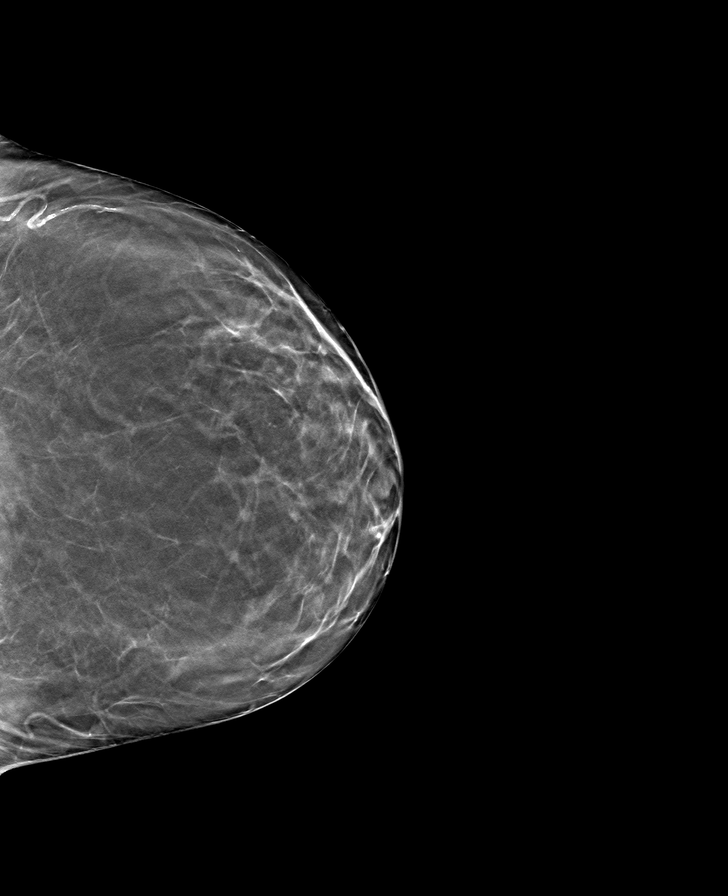

[L MLO tomo · tomo slice 31/61.0]
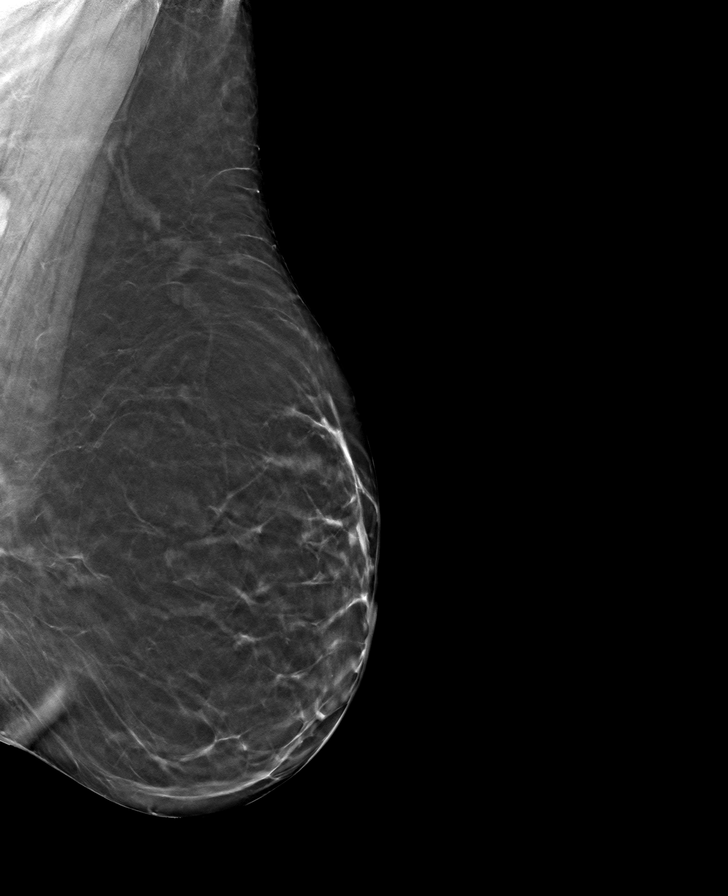

[R CC tomo · tomo slice 29/57.0]
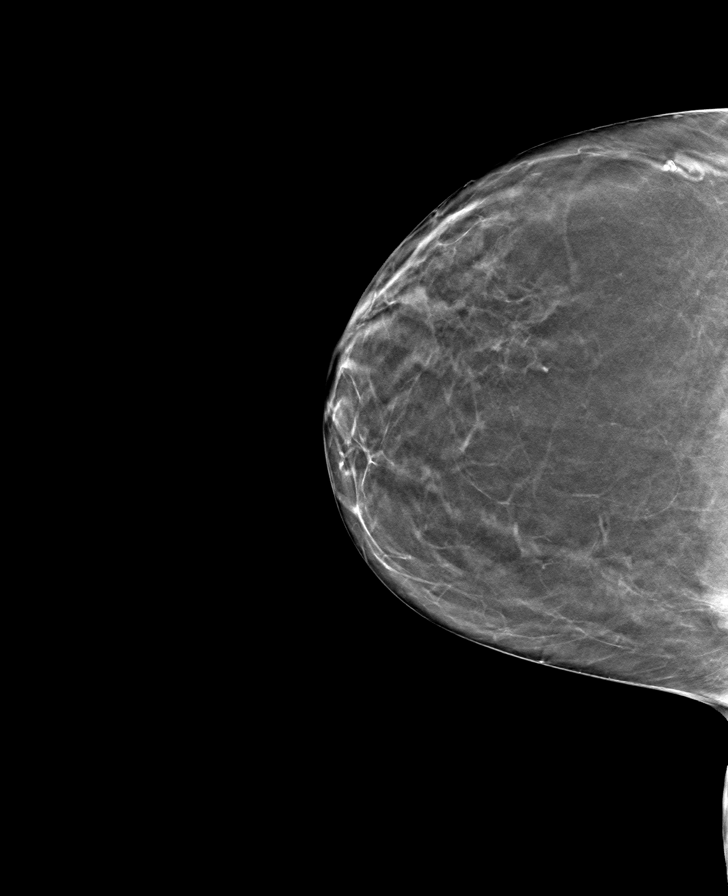

[8 of 24 positions shown; findings below may reference images not displayed]

ACR Breast Density Category b: There are scattered areas of
fibroglandular density.
FINDINGS: There are no findings suspicious for malignancy. Images were
processed with CAD.
IMPRESSION: No mammographic evidence of malignancy. A result letter of this
screening mammogram will be mailed directly to the patient.

RECOMMENDATION:
Screening mammogram in one year. (Code:[id number])

BI-RADS CATEGORY  1: Negative.

## 2021-03-29 ENCOUNTER — Other Ambulatory Visit: Payer: Self-pay

## 2021-03-30 ENCOUNTER — Ambulatory Visit (INDEPENDENT_AMBULATORY_CARE_PROVIDER_SITE_OTHER): Payer: Medicare Other | Admitting: Nurse Practitioner

## 2021-03-30 ENCOUNTER — Encounter: Payer: Self-pay | Admitting: Nurse Practitioner

## 2021-03-30 VITALS — BP 110/72 | HR 72 | Temp 97.8°F | Ht 63.0 in | Wt 184.6 lb

## 2021-03-30 DIAGNOSIS — Z136 Encounter for screening for cardiovascular disorders: Secondary | ICD-10-CM

## 2021-03-30 DIAGNOSIS — Z1211 Encounter for screening for malignant neoplasm of colon: Secondary | ICD-10-CM | POA: Diagnosis not present

## 2021-03-30 DIAGNOSIS — F3341 Major depressive disorder, recurrent, in partial remission: Secondary | ICD-10-CM

## 2021-03-30 DIAGNOSIS — Z13220 Encounter for screening for lipoid disorders: Secondary | ICD-10-CM

## 2021-03-30 DIAGNOSIS — F3342 Major depressive disorder, recurrent, in full remission: Secondary | ICD-10-CM | POA: Insufficient documentation

## 2021-03-30 DIAGNOSIS — B372 Candidiasis of skin and nail: Secondary | ICD-10-CM

## 2021-03-30 LAB — LIPID PANEL
Cholesterol: 183 mg/dL (ref 0–200)
HDL: 60.6 mg/dL (ref >39.00)
LDL Cholesterol: 102 mg/dL — ABNORMAL HIGH (ref 0–99)
NonHDL: 122.83
Total CHOL/HDL Ratio: 3
Triglycerides: 106 mg/dL (ref 0.0–149.0)
VLDL: 21.2 mg/dL (ref 0.0–40.0)

## 2021-03-30 LAB — COMPREHENSIVE METABOLIC PANEL
ALT: 10 U/L (ref 0–35)
AST: 14 U/L (ref 0–37)
Albumin: 3.7 g/dL (ref 3.5–5.2)
Alkaline Phosphatase: 48 U/L (ref 39–117)
BUN: 15 mg/dL (ref 6–23)
CO2: 28 mEq/L (ref 19–32)
Calcium: 8.9 mg/dL (ref 8.4–10.5)
Chloride: 104 mEq/L (ref 96–112)
Creatinine, Ser: 0.6 mg/dL (ref 0.40–1.20)
GFR: 93.5 mL/min (ref >60.00)
Glucose, Bld: 84 mg/dL (ref 70–99)
Potassium: 4.2 mEq/L (ref 3.5–5.1)
Sodium: 137 mEq/L (ref 135–145)
Total Bilirubin: 0.6 mg/dL (ref 0.2–1.2)
Total Protein: 6 g/dL (ref 6.0–8.3)

## 2021-03-30 MED ORDER — FLUCONAZOLE 200 MG PO TABS: 200.0000 mg | ORAL_TABLET | Freq: Once | ORAL | 0 refills | Status: AC

## 2021-03-30 MED ORDER — FLUOXETINE HCL 20 MG PO CAPS: 20.0000 mg | ORAL_CAPSULE | Freq: Two times a day (BID) | ORAL | 3 refills | Status: DC

## 2021-03-30 NOTE — Progress Notes (Signed)
Subjective:  Patient ID: Betty Holmes, female    DOB: 07/18/1956  Age: 65 y.o. MRN: [id number]  CC: Follow-up (1 year f/u on depression. Medication refill needed (prozac))   HPI Reports recurrent rash in groin region, resolved in past with diflucan last year. Due to increase humidity and sweating. No improvement with OTC antifungal powder.  She agreed to cologuard order Declined Prevenar vaccine  Recurrent major depressive disorder, in partial remission ([place]) Stable mood with fluoxetine Refill sent  Wt Readings from Last 3 Encounters:  03/30/21 184 lb 9.6 oz (83.7 kg)  08/02/20 171 lb (77.6 kg)  05/11/20 163 lb 3.2 oz (74 kg)   BP Readings from Last 3 Encounters:  03/30/21 110/72  05/11/20 116/84  01/05/20 124/80   Reviewed past Medical, Social and Family history today.  Outpatient Medications Prior to Visit  Medication Sig Dispense Refill  . acetaminophen (TYLENOL) 325 MG tablet Take 975 mg by mouth 3 (three) times daily.    . hydrOXYzine (ATARAX/VISTARIL) 25 MG tablet TAKE 1 TABLET (25 MG TOTAL) BY MOUTH DAILY AS NEEDED FOR ANXIETY OR ITCHING. 90 tablet 1  . Vitamin D, Cholecalciferol, 1000 units TABS Take 1 tablet by mouth 2 times daily at 12 noon and 4 pm. 180 tablet 1  . FLUoxetine (PROZAC) 20 MG capsule TAKE 1 CAPSULE (20 MG TOTAL) BY MOUTH 2 (TWO) TIMES DAILY. NO ADDITIONAL REFILL WITHOUT OFFICE VISIT 360 capsule 0  . miconazole (MICOTIN) 2 % powder Apply topically 2 (two) times daily as needed for itching. (Patient not taking: Reported on 03/30/2021) 70 g 0   Facility-Administered Medications Prior to Visit  Medication Dose Route Frequency Provider Last Rate Last Admin  . betamethasone acetate-betamethasone sodium phosphate (CELESTONE) injection 3 mg  3 mg Intramuscular Once Betty Holmes, DPM        ROS See HPI  Objective:  BP 110/72 (BP Location: Left Arm, Patient Position: Sitting, Cuff Size: Normal)   Pulse 72   Temp 97.8 F (36.6 C) (Temporal)    Ht 5\' 3"  (1.6 Holmes)   Wt 184 lb 9.6 oz (83.7 kg)   SpO2 99%   BMI 32.70 kg/Holmes   Physical Exam Constitutional:      General: She is not in acute distress. Neck:     Thyroid: No thyroid mass, thyromegaly or thyroid tenderness.  Cardiovascular:     Pulses: Normal pulses.     Heart sounds: Normal heart sounds.  Pulmonary:     Effort: Pulmonary effort is normal.     Breath sounds: Normal breath sounds.  Musculoskeletal:     Right lower leg: No edema.     Left lower leg: No edema.  Neurological:     Mental Status: She is alert and oriented to person, place, and time.  Psychiatric:        Mood and Affect: Mood normal.        Behavior: Behavior normal.        Thought Content: Thought content normal.    Assessment & Plan:  This visit occurred during the SARS-CoV-2 public health emergency.  Safety protocols were in place, including screening questions prior to the visit, additional usage of staff PPE, and extensive cleaning of exam room while observing appropriate contact time as indicated for disinfecting solutions.   Betty Holmes was seen today for follow-up.  Diagnoses and all orders for this visit:  Recurrent major depressive disorder, in partial remission ([place]) -     FLUoxetine (PROZAC) 20 MG capsule; Take  1 capsule (20 mg total) by mouth 2 (two) times daily. -     Comprehensive metabolic panel  Candidal intertrigo -     fluconazole (DIFLUCAN) 200 MG tablet; Take 1 tablet (200 mg total) by mouth once for 1 dose.  Colon cancer screening -     Cologuard  Encounter for lipid screening for cardiovascular disease -     Lipid panel    Problem List Items Addressed This Visit      Other   Recurrent major depressive disorder, in partial remission ([place]) - Primary    Stable mood with fluoxetine Refill sent      Relevant Medications   FLUoxetine (PROZAC) 20 MG capsule   Other Relevant Orders   Comprehensive metabolic panel    Other Visit Diagnoses    Candidal intertrigo        Relevant Medications   fluconazole (DIFLUCAN) 200 MG tablet   Colon cancer screening       Relevant Orders   Cologuard   Encounter for lipid screening for cardiovascular disease       Relevant Orders   Lipid panel      Follow-up: Return in about 6 months (around 09/29/2021) for CPE (fasting).  Betty Lacy, NP

## 2021-03-30 NOTE — Patient Instructions (Signed)
Go to lab for blood draw.

## 2021-03-30 NOTE — Assessment & Plan Note (Signed)
Stable mood with fluoxetine Refill sent 

## 2021-04-03 ENCOUNTER — Ambulatory Visit: Payer: Medicare Other | Admitting: Nurse Practitioner

## 2021-04-19 DIAGNOSIS — S76312A Strain of muscle, fascia and tendon of the posterior muscle group at thigh level, left thigh, initial encounter: Secondary | ICD-10-CM | POA: Diagnosis not present

## 2021-04-19 DIAGNOSIS — M1710 Unilateral primary osteoarthritis, unspecified knee: Secondary | ICD-10-CM | POA: Diagnosis not present

## 2021-04-19 DIAGNOSIS — Z96652 Presence of left artificial knee joint: Secondary | ICD-10-CM | POA: Diagnosis not present

## 2021-07-10 DIAGNOSIS — H43813 Vitreous degeneration, bilateral: Secondary | ICD-10-CM | POA: Diagnosis not present

## 2021-07-10 DIAGNOSIS — H2513 Age-related nuclear cataract, bilateral: Secondary | ICD-10-CM | POA: Diagnosis not present

## 2021-07-10 DIAGNOSIS — H40013 Open angle with borderline findings, low risk, bilateral: Secondary | ICD-10-CM | POA: Diagnosis not present

## 2021-08-03 DIAGNOSIS — H25812 Combined forms of age-related cataract, left eye: Secondary | ICD-10-CM | POA: Diagnosis not present

## 2021-08-15 DIAGNOSIS — H2511 Age-related nuclear cataract, right eye: Secondary | ICD-10-CM | POA: Diagnosis not present

## 2021-08-17 DIAGNOSIS — H25811 Combined forms of age-related cataract, right eye: Secondary | ICD-10-CM | POA: Diagnosis not present

## 2021-08-22 ENCOUNTER — Ambulatory Visit (INDEPENDENT_AMBULATORY_CARE_PROVIDER_SITE_OTHER): Payer: Medicare Other | Admitting: Nurse Practitioner

## 2021-08-22 ENCOUNTER — Other Ambulatory Visit: Payer: Self-pay

## 2021-08-22 ENCOUNTER — Encounter: Payer: Self-pay | Admitting: Nurse Practitioner

## 2021-08-22 VITALS — BP 104/74 | HR 70 | Temp 97.4°F | Wt 192.6 lb

## 2021-08-22 DIAGNOSIS — E669 Obesity, unspecified: Secondary | ICD-10-CM

## 2021-08-22 DIAGNOSIS — R519 Headache, unspecified: Secondary | ICD-10-CM

## 2021-08-22 DIAGNOSIS — R400 Somnolence: Secondary | ICD-10-CM

## 2021-08-22 DIAGNOSIS — B372 Candidiasis of skin and nail: Secondary | ICD-10-CM | POA: Insufficient documentation

## 2021-08-22 DIAGNOSIS — Z6834 Body mass index (BMI) 34.0-34.9, adult: Secondary | ICD-10-CM | POA: Diagnosis not present

## 2021-08-22 DIAGNOSIS — E559 Vitamin D deficiency, unspecified: Secondary | ICD-10-CM

## 2021-08-22 LAB — VITAMIN D 25 HYDROXY (VIT D DEFICIENCY, FRACTURES): VITD: 22.4 ng/mL — ABNORMAL LOW (ref 30.00–100.00)

## 2021-08-22 LAB — CBC
HCT: 40.5 % (ref 36.0–46.0)
Hemoglobin: 13.4 g/dL (ref 12.0–15.0)
MCHC: 33 g/dL (ref 30.0–36.0)
MCV: 88 fl (ref 78.0–100.0)
Platelets: 184 10*3/uL (ref 150.0–400.0)
RBC: 4.61 Mil/uL (ref 3.87–5.11)
RDW: 13.3 % (ref 11.5–15.5)
WBC: 4.2 10*3/uL (ref 4.0–10.5)

## 2021-08-22 LAB — IBC + FERRITIN
Ferritin: 24.1 ng/mL (ref 10.0–291.0)
Iron: 99 ug/dL (ref 42–145)
Saturation Ratios: 28.7 % (ref 20.0–50.0)
TIBC: 344.4 ug/dL (ref 250.0–450.0)
Transferrin: 246 mg/dL (ref 212.0–360.0)

## 2021-08-22 LAB — BASIC METABOLIC PANEL
BUN: 15 mg/dL (ref 6–23)
CO2: 25 mEq/L (ref 19–32)
Calcium: 9 mg/dL (ref 8.4–10.5)
Chloride: 105 mEq/L (ref 96–112)
Creatinine, Ser: 0.64 mg/dL (ref 0.40–1.20)
GFR: 91.8 mL/min (ref >60.00)
Glucose, Bld: 84 mg/dL (ref 70–99)
Potassium: 4 mEq/L (ref 3.5–5.1)
Sodium: 137 mEq/L (ref 135–145)

## 2021-08-22 LAB — VITAMIN B12: Vitamin B-12: 285 pg/mL (ref 211–911)

## 2021-08-22 LAB — LDL CHOLESTEROL, DIRECT: Direct LDL: 121 mg/dL

## 2021-08-22 LAB — HEMOGLOBIN A1C: Hgb A1c MFr Bld: 5.9 % (ref 4.6–6.5)

## 2021-08-22 LAB — TSH: TSH: 1.52 u[IU]/mL (ref 0.35–5.50)

## 2021-08-22 MED ORDER — VITAMIN D (ERGOCALCIFEROL) 1.25 MG (50000 UNIT) PO CAPS: 50000.0000 [IU] | ORAL_CAPSULE | ORAL | 0 refills | Status: DC

## 2021-08-22 MED ORDER — MICONAZOLE NITRATE 2 % EX POWD: Freq: Two times a day (BID) | CUTANEOUS | 3 refills | Status: DC | PRN

## 2021-08-22 MED ORDER — FLUCONAZOLE 150 MG PO TABS
ORAL_TABLET | ORAL | 0 refills | Status: DC
Start: 2021-08-22 — End: 2021-10-30

## 2021-08-22 NOTE — Patient Instructions (Signed)
Sign medical release to get records from [facility] vitamin D and diflucan You will be contacted tos chedule appt with neurology  Go to lab for blood draw.  Intertrigo Intertrigo is skin irritation (inflammation) that happens in warm, moist areas of the body. The irritation can cause a rash and make skin raw and itchy. The rash is usually pink or red. It happens mostly between folds of skin or where skin rubs together, such as: Between the toes. In the armpits. In the groin area. Under the belly. Under the breasts. Around the butt area. This condition is not passed from person to person (is not contagious). What are the causes? Heat, moisture, rubbing, and not enough air movement. The condition can be made worse by: Sweat. Bacteria. A fungus, such as yeast. What increases the risk? Moisture in your skin folds. You are more likely to develop this condition if you: Have diabetes. Are overweight. Are not able to move around. Live in a warm and moist climate. Wear splints, braces, or other medical devices. Are not able to control your pee (urine) or poop (stool). What are the signs or symptoms? A pink or red skin rash in the skin fold or near the skin fold. Raw or scaly skin. Itching. A burning feeling. Bleeding. Leaking fluid. A bad smell. How is this treated? Cleaning and drying your skin. Taking an antibiotic medicine or using an antibiotic skin cream for a bacterial infection. Using an antifungal cream on your skin or taking pills for an infection that was caused by a fungus, such as yeast. Using a steroid ointment to stop the itching and irritation. Separating the skin fold with a clean cotton cloth to absorb moisture and allow air to flow into the area. Follow these instructions at home: Keep the affected area clean and dry. Do not scratch your skin. Stay cool as much as you can. Use an air conditioner or a fan, if you have one. Apply over-the-counter and  prescription medicines only as told by your doctor. If you were prescribed an antibiotic medicine, use it as told by your doctor. Do not stop using the antibiotic even if your condition starts to get better. Keep all follow-up visits as told by your doctor. This is important. How is this prevented?  Stay at a healthy weight. Take care of your feet. This is very important if you have diabetes. You should: Wear shoes that fit well. Keep your feet dry. Wear clean cotton or wool socks. Protect the skin in your groin and butt area as told by your doctor. To do this: Follow a regular cleaning routine. Use creams, powders, or ointments that protect your skin. Change protection pads often. Do not wear tight clothes. Wear clothes that: Are loose. Take moisture away from your body. Are made of cotton. Wear a bra that gives good support, if needed. Shower and dry yourself well after being active. Use a hair dryer on a cool setting to dry between skin folds. Keep your blood sugar under control if you have diabetes. Contact a doctor if: Your symptoms do not get better with treatment. Your symptoms get worse or they spread. You notice more redness and warmth. You have a fever. Summary Intertrigo is skin irritation that occurs when folds of skin rub together. This condition is caused by heat, moisture, and rubbing. This condition may be treated by cleaning and drying your skin and with medicines. Apply over-the-counter and prescription medicines only as told by your doctor.  Keep all follow-up visits as told by your doctor. This is important. This information is not intended to replace advice given to you by your health care provider. Make sure you discuss any questions you have with your health care provider. Document Revised: 09/17/2018 Document Reviewed: 09/17/2018 Elsevier Patient Education  2022 [employer].

## 2021-08-22 NOTE — Assessment & Plan Note (Signed)
Chronic, waxing and waning due to humidity and skin folds. Present under breasts, bilateral underarms and groin region Associates with redness and itching. She needs refill on miconazole powder.  No sign of cellulitis Sent diflucan and miconazole powder.

## 2021-08-22 NOTE — Progress Notes (Signed)
Subjective:  Patient ID: Betty Holmes, female    DOB: 01/24/1956  Age: 65 y.o. MRN: [id number]  CC: Acute Visit (Pt c/o rash on underarms and private area that comes and goes for years. Pt would also like labs for blood sugars. )  HPI  Candidal intertrigo Chronic, waxing and waning due to humidity and skin folds. Present under breasts, bilateral underarms and groin region Associates with redness and itching. She needs refill on miconazole powder.  No sign of cellulitis Sent diflucan and miconazole powder. Wt Readings from Last 3 Encounters:  08/22/21 192 lb 9.6 oz (87.4 kg)  03/30/21 184 lb 9.6 oz (83.7 kg)  08/02/20 171 lb (77.6 kg)    Today she also reports persistent AM headache, daytime somnolence with naps, fatigue, and weight gain. Ongoing for over 1year. She has maintain a low carb vegetarian diet, and exercise 4x/week (swimming and weight training). Headache improve with use of excedrin Persistent fatigue a daytime somnolence despite 8hrs of sleep at night. Denies any snoring or witnessed apneic episodes. She takes a 1-2hrs nap daily.  Reviewed past Medical, Social and Family history today.  Outpatient Medications Prior to Visit  Medication Sig Dispense Refill   FLUoxetine (PROZAC) 20 MG capsule Take 1 capsule (20 mg total) by mouth 2 (two) times daily. 180 capsule 3   hydrOXYzine (ATARAX/VISTARIL) 25 MG tablet TAKE 1 TABLET (25 MG TOTAL) BY MOUTH DAILY AS NEEDED FOR ANXIETY OR ITCHING. 90 tablet 1   acetaminophen (TYLENOL) 325 MG tablet Take 975 mg by mouth 3 (three) times daily.     miconazole (MICOTIN) 2 % powder Apply topically 2 (two) times daily as needed for itching. (Patient not taking: Reported on 03/30/2021) 70 g 0   Vitamin D, Cholecalciferol, 1000 units TABS Take 1 tablet by mouth 2 times daily at 12 noon and 4 pm. (Patient not taking: Reported on 08/22/2021) 180 tablet 1   Facility-Administered Medications Prior to Visit  Medication Dose Route Frequency  Provider Last Rate Last Admin   betamethasone acetate-betamethasone sodium phosphate (CELESTONE) injection 3 mg  3 mg Intramuscular Once Evans, Brent M, DPM        ROS See HPI  Objective:  BP 104/74 (BP Location: Left Arm, Patient Position: Sitting, Cuff Size: Normal)   Pulse 70   Temp (!) 97.4 F (36.3 C) (Temporal)   Wt 192 lb 9.6 oz (87.4 kg)   SpO2 98%   BMI 34.12 kg/m   Physical Exam Constitutional:      Appearance: She is obese.  Neck:     Thyroid: No thyroid mass, thyromegaly or thyroid tenderness.  Cardiovascular:     Rate and Rhythm: Normal rate and regular rhythm.     Pulses: Normal pulses.     Heart sounds: Normal heart sounds.  Pulmonary:     Effort: Pulmonary effort is normal.     Breath sounds: Normal breath sounds.  Musculoskeletal:     Cervical back: Normal range of motion and neck supple.     Right lower leg: No edema.     Left lower leg: No edema.  Lymphadenopathy:     Cervical: No cervical adenopathy.  Skin:    Findings: Rash present.       Neurological:     Mental Status: She is alert and oriented to person, place, and time.  Psychiatric:        Mood and Affect: Mood normal.        Behavior: Behavior normal.  Thought Content: Thought content normal.   Assessment & Plan:  This visit occurred during the SARS-CoV-2 public health emergency.  Safety protocols were in place, including screening questions prior to the visit, additional usage of staff PPE, and extensive cleaning of exam room while observing appropriate contact time as indicated for disinfecting solutions.   Shanaiya was seen today for acute visit.  Diagnoses and all orders for this visit:  Candidal intertrigo -     fluconazole (DIFLUCAN) 150 MG tablet; Take 1tab every 3days till complete -     miconazole (MICOTIN) 2 % powder; Apply topically 2 (two) times daily as needed for itching.  Morning headache -     Ambulatory referral to Neurology  Daytime somnolence -      Ambulatory referral to Neurology -     CBC -     Basic metabolic panel -     123456 -     IBC + Ferritin  Class 1 obesity with serious comorbidity and body mass index (BMI) of 34.0 to 34.9 in adult, unspecified obesity type -     Ambulatory referral to Neurology -     TSH -     Direct LDL -     Hemoglobin A1c  Vitamin D deficiency -     VITAMIN D 25 Hydroxy (Vit-D Deficiency, Fractures) -     Vitamin D, Ergocalciferol, (DRISDOL) 1.25 MG (50000 UNIT) CAPS capsule; Take 1 capsule (50,000 Units total) by mouth every 7 (seven) days.  Problem List Items Addressed This Visit       Musculoskeletal and Integument   Candidal intertrigo - Primary    Chronic, waxing and waning due to humidity and skin folds. Present under breasts, bilateral underarms and groin region Associates with redness and itching. She needs refill on miconazole powder.  No sign of cellulitis Sent diflucan and miconazole powder.      Relevant Medications   fluconazole (DIFLUCAN) 150 MG tablet   miconazole (MICOTIN) 2 % powder     Other   Vitamin D deficiency   Relevant Medications   Vitamin D, Ergocalciferol, (DRISDOL) 1.25 MG (50000 UNIT) CAPS capsule   Other Relevant Orders   VITAMIN D 25 Hydroxy (Vit-D Deficiency, Fractures)   Other Visit Diagnoses     Morning headache       Relevant Orders   Ambulatory referral to Neurology   Daytime somnolence       Relevant Orders   Ambulatory referral to Neurology   CBC   Basic metabolic panel   123456   IBC + Ferritin   Class 1 obesity with serious comorbidity and body mass index (BMI) of 34.0 to 34.9 in adult, unspecified obesity type       Relevant Orders   Ambulatory referral to Neurology   TSH   Direct LDL   Hemoglobin A1c       Follow-up: Return in about 6 months (around 02/19/2022) for CPE (fasting).  Wilfred Lacy, NP

## 2021-09-20 ENCOUNTER — Encounter: Payer: Self-pay | Admitting: Nurse Practitioner

## 2021-09-20 DIAGNOSIS — F3341 Major depressive disorder, recurrent, in partial remission: Secondary | ICD-10-CM

## 2021-09-24 ENCOUNTER — Other Ambulatory Visit: Payer: Self-pay | Admitting: Nurse Practitioner

## 2021-09-24 DIAGNOSIS — F3341 Major depressive disorder, recurrent, in partial remission: Secondary | ICD-10-CM

## 2021-09-24 MED ORDER — FLUOXETINE HCL 20 MG PO TABS: 40.0000 mg | ORAL_TABLET | Freq: Every day | ORAL | 3 refills | Status: DC

## 2021-09-27 NOTE — Telephone Encounter (Signed)
Patient called and states pharmacy wanted to confirm that medication needed to be switched to tablet and not capsule. I spoke with pharmacy and they stated that the patient insurance will not cover the tablets and she would have to get the capsules in order for insurance to cover. Informed pharmacist I would inform patient and provider.   Patient notified and verbalized understanding

## 2021-09-28 NOTE — Telephone Encounter (Signed)
Pt does not want to pay otp cost and will keep Rx that is covered by insurance.

## 2021-10-08 DIAGNOSIS — R5382 Chronic fatigue, unspecified: Secondary | ICD-10-CM | POA: Diagnosis not present

## 2021-10-08 DIAGNOSIS — E7800 Pure hypercholesterolemia, unspecified: Secondary | ICD-10-CM | POA: Diagnosis not present

## 2021-10-08 LAB — HEPATIC FUNCTION PANEL
ALT: 17 (ref 7–35)
AST: 25 (ref 13–35)
Alkaline Phosphatase: 48 (ref 25–125)
Bilirubin, Total: 0.6

## 2021-10-08 LAB — BASIC METABOLIC PANEL
BUN: 14 (ref 4–21)
CO2: 24 — AB (ref 13–22)
Chloride: 106 (ref 99–108)
Creatinine: 0.7 (ref <=1.1)
Glucose: 97
Potassium: 4.3 (ref 3.4–5.3)
Sodium: 138 (ref 137–147)

## 2021-10-08 LAB — IRON,TIBC AND FERRITIN PANEL
Ferritin: 32
Iron: 86

## 2021-10-08 LAB — LIPID PANEL
Cholesterol: 194 (ref 0–200)
HDL: 67 (ref 35–70)
LDL Cholesterol: 114
Triglycerides: 64 (ref 40–160)

## 2021-10-08 LAB — CBC AND DIFFERENTIAL
HCT: 42 (ref 36–46)
Hemoglobin: 13.5 (ref 12.0–16.0)
WBC: 3.6

## 2021-10-08 LAB — TESTOSTERONE: Testosterone: 32

## 2021-10-08 LAB — TSH: TSH: 1.17 (ref <=5.90)

## 2021-10-08 LAB — CBC: RBC: 4.63 (ref 3.87–5.11)

## 2021-10-08 LAB — HEMOGLOBIN A1C: Hemoglobin A1C: 4.8

## 2021-10-08 LAB — COMPREHENSIVE METABOLIC PANEL
Albumin: 4 (ref 3.5–5.0)
Calcium: 9 (ref 8.7–10.7)
GFR calc Af Amer: 108
GFR calc non Af Amer: 89

## 2021-10-08 LAB — VITAMIN D 25 HYDROXY (VIT D DEFICIENCY, FRACTURES): Vit D, 25-Hydroxy: 33

## 2021-10-11 ENCOUNTER — Telehealth: Payer: Self-pay | Admitting: Nurse Practitioner

## 2021-10-11 NOTE — Telephone Encounter (Signed)
Left message for patient to call back and schedule Medicare Annual Wellness Visit (AWV) .   Please offer to do virtually or by telephone.  Left office number and my jabber [phone].  Last AWV:08/02/2020  Please schedule at anytime with Nurse Health Advisor.

## 2021-10-22 DIAGNOSIS — N8184 Pelvic muscle wasting: Secondary | ICD-10-CM | POA: Diagnosis not present

## 2021-10-22 DIAGNOSIS — E7800 Pure hypercholesterolemia, unspecified: Secondary | ICD-10-CM | POA: Diagnosis not present

## 2021-10-22 DIAGNOSIS — R5382 Chronic fatigue, unspecified: Secondary | ICD-10-CM | POA: Diagnosis not present

## 2021-10-22 DIAGNOSIS — R21 Rash and other nonspecific skin eruption: Secondary | ICD-10-CM | POA: Diagnosis not present

## 2021-10-22 DIAGNOSIS — R454 Irritability and anger: Secondary | ICD-10-CM | POA: Diagnosis not present

## 2021-10-29 DIAGNOSIS — E7800 Pure hypercholesterolemia, unspecified: Secondary | ICD-10-CM | POA: Diagnosis not present

## 2021-10-30 ENCOUNTER — Ambulatory Visit (INDEPENDENT_AMBULATORY_CARE_PROVIDER_SITE_OTHER): Payer: Medicare Other | Admitting: Nurse Practitioner

## 2021-10-30 ENCOUNTER — Other Ambulatory Visit: Payer: Self-pay

## 2021-10-30 ENCOUNTER — Encounter: Payer: Self-pay | Admitting: Nurse Practitioner

## 2021-10-30 VITALS — BP 104/78 | HR 61 | Temp 96.7°F | Ht 63.0 in | Wt 195.2 lb

## 2021-10-30 DIAGNOSIS — E6609 Other obesity due to excess calories: Secondary | ICD-10-CM

## 2021-10-30 DIAGNOSIS — Z6834 Body mass index (BMI) 34.0-34.9, adult: Secondary | ICD-10-CM

## 2021-10-30 DIAGNOSIS — Z0000 Encounter for general adult medical examination without abnormal findings: Secondary | ICD-10-CM

## 2021-10-30 DIAGNOSIS — Z1231 Encounter for screening mammogram for malignant neoplasm of breast: Secondary | ICD-10-CM

## 2021-10-30 DIAGNOSIS — Z13220 Encounter for screening for lipoid disorders: Secondary | ICD-10-CM

## 2021-10-30 DIAGNOSIS — M810 Age-related osteoporosis without current pathological fracture: Secondary | ICD-10-CM

## 2021-10-30 DIAGNOSIS — F3341 Major depressive disorder, recurrent, in partial remission: Secondary | ICD-10-CM

## 2021-10-30 DIAGNOSIS — Z1211 Encounter for screening for malignant neoplasm of colon: Secondary | ICD-10-CM

## 2021-10-30 DIAGNOSIS — Z136 Encounter for screening for cardiovascular disorders: Secondary | ICD-10-CM | POA: Diagnosis not present

## 2021-10-30 DIAGNOSIS — E669 Obesity, unspecified: Secondary | ICD-10-CM | POA: Insufficient documentation

## 2021-10-30 DIAGNOSIS — Z9849 Cataract extraction status, unspecified eye: Secondary | ICD-10-CM | POA: Insufficient documentation

## 2021-10-30 MED ORDER — FLUOXETINE HCL 20 MG PO CAPS: 40.0000 mg | ORAL_CAPSULE | Freq: Every day | ORAL | 3 refills | Status: DC

## 2021-10-30 NOTE — Patient Instructions (Addendum)
Go to lab for blood draw  You will be contacted to schedule appt for mammogram and bone density.  Call office if you do not receive cologuard kit in next 2weeks.  Sign medical release to get records from [facility] and colonoscopy report from [place].  Preventive Care 72 Years and Older, Female Preventive care refers to lifestyle choices and visits with your health care provider that can promote health and wellness. Preventive care visits are also called wellness exams. What can I expect for my preventive care visit? Counseling Your health care provider may ask you questions about your: Medical history, including: Past medical problems. Family medical history. Pregnancy and menstrual history. History of falls. Current health, including: Memory and ability to understand (cognition). Emotional well-being. Home life and relationship well-being. Sexual activity and sexual health. Lifestyle, including: Alcohol, nicotine or tobacco, and drug use. Access to firearms. Diet, exercise, and sleep habits. Work and work Statistician. Sunscreen use. Safety issues such as seatbelt and bike helmet use. Physical exam Your health care provider will check your: Height and weight. These may be used to calculate your BMI (body mass index). BMI is a measurement that tells if you are at a healthy weight. Waist circumference. This measures the distance around your waistline. This measurement also tells if you are at a healthy weight and may help predict your risk of certain diseases, such as type 2 diabetes and high blood pressure. Heart rate and blood pressure. Body temperature. Skin for abnormal spots. What immunizations do I need? Vaccines are usually given at various ages, according to a schedule. Your health care provider will recommend vaccines for you based on your age, medical history, and lifestyle or other factors, such as travel or where you work. What tests do I need? Screening Your health  care provider may recommend screening tests for certain conditions. This may include: Lipid and cholesterol levels. Hepatitis C test. Hepatitis B test. HIV (human immunodeficiency virus) test. STI (sexually transmitted infection) testing, if you are at risk. Lung cancer screening. Colorectal cancer screening. Diabetes screening. This is done by checking your blood sugar (glucose) after you have not eaten for a while (fasting). Mammogram. Talk with your health care provider about how often you should have regular mammograms. BRCA-related cancer screening. This may be done if you have a family history of breast, ovarian, tubal, or peritoneal cancers. Bone density scan. This is done to screen for osteoporosis. Talk with your health care provider about your test results, treatment options, and if necessary, the need for more tests. Follow these instructions at home: Eating and drinking  Eat a diet that includes fresh fruits and vegetables, whole grains, lean protein, and low-fat dairy products. Limit your intake of foods with high amounts of sugar, saturated fats, and salt. Take vitamin and mineral supplements as recommended by your health care provider. Do not drink alcohol if your health care provider tells you not to drink. If you drink alcohol: Limit how much you have to 0-1 drink a day. Know how much alcohol is in your drink. In the [place] one drink equals one 12 oz bottle of beer (355 mL), one 5 oz glass of wine (148 mL), or one 1 oz glass of hard liquor (44 mL). Lifestyle Brush your teeth every morning and night with fluoride toothpaste. Floss one time each day. Exercise for at least 30 minutes 5 or more days each week. Do not use any products that contain nicotine or tobacco. These products include cigarettes, chewing tobacco, and  vaping devices, such as e-cigarettes. If you need help quitting, ask your health care provider. Do not use drugs. If you are sexually active, practice safe  sex. Use a condom or other form of protection in order to prevent STIs. Take aspirin only as told by your health care provider. Make sure that you understand how much to take and what form to take. Work with your health care provider to find out whether it is safe and beneficial for you to take aspirin daily. Ask your health care provider if you need to take a cholesterol-lowering medicine (statin). Find healthy ways to manage stress, such as: Meditation, yoga, or listening to music. Journaling. Talking to a trusted person. Spending time with friends and family. Minimize exposure to UV radiation to reduce your risk of skin cancer. Safety Always wear your seat belt while driving or riding in a vehicle. Do not drive: If you have been drinking alcohol. Do not ride with someone who has been drinking. When you are tired or distracted. While texting. If you have been using any mind-altering substances or drugs. Wear a helmet and other protective equipment during sports activities. If you have firearms in your house, make sure you follow all gun safety procedures. What's next? Visit your health care provider once a year for an annual wellness visit. Ask your health care provider how often you should have your eyes and teeth checked. Stay up to date on all vaccines. This information is not intended to replace advice given to you by your health care provider. Make sure you discuss any questions you have with your health care provider. Document Revised: 06/06/2021 Document Reviewed: 06/06/2021 Elsevier Patient Education  [place].

## 2021-10-30 NOTE — Progress Notes (Signed)
Subjective:    Patient ID: Betty Holmes, female    DOB: [birth year], 65 y.o.   MRN: [id number]  Patient presents today for CPE   HPI Obesity Participates in weight loss progarm through [facility] Current use of phentermine 67m, denies any adverse side effects. States she is awre of possible side effects Phentermine has helped with decreased meal portion and increased motivation to exercise. Exercise regimen: walking 322mes a day, plans to start water aerobics and use of recumbent bicycle. Lost 2lbs in last 1week. BP Readings from Last 3 Encounters:  10/30/21 104/78  08/22/21 104/74  03/30/21 110/72    Recurrent major depressive disorder, in partial remission (HCCadizStable with fluoxetine Refill sent  Osteoporosis Repeat dexa scan Advised to start calcium and vitamin D supplement OTC  Vision:up to date Dental:up to date Diet:heart healthy Exercise:walking daily, count steps up to 10,000,  Weight:  Wt Readings from Last 3 Encounters:  10/30/21 195 lb 3.2 oz (88.5 kg)  08/22/21 192 lb 9.6 oz (87.4 kg)  03/30/21 184 lb 9.6 oz (83.7 kg)    Sexual History (orientation,birth control, marital status, STD):agreed to breast exam, needs repeat mammogram. [place] Agreed to cologuard (no constipation or diarrhea or colitis orFhx of colon cancer/colon polyp).  [facility] 08/22/2021 03/30/2021 08/02/2020 05/11/2020 01/05/2020 09/21/2019 04/01/2017  Decreased Interest 2 1 0 _0 Down, Depressed, Hopeless 2 1 0 1 3 - 3  PHQ - 2 Score 4 2 0 _1 Altered sleeping 2 2 - _2 Tired, decreased energy 2 2 - _3 Change in appetite 2 2 - _4 0  Feeling bad or failure about yourself  2 1 - _5 0  Trouble concentrating 1 1 - 0 _6 Moving slowly or fidgety/restless 0 0 - 0 0 2 0  Suicidal thoughts 0 0 - 0 2 2 0  PHQ-9 Score 13 10 - _7 Difficult doing  work/chores Somewhat difficult Somewhat difficult - - - - -   Immunizations: (TDAP, Hep C screen, Pneumovax, Influenza, zoster): declined all immunizations Health Maintenance  Topic Date Due   Cologuard (Stool DNA test)  Never done   Stool Blood Test  11/11/2018   COVID-19 Vaccine (4 - Booster for Pfizer series) 12/28/2020   Zoster (Shingles) Vaccine (1 of 2) 01/30/2022*   Flu Shot  03/22/2022*   Pneumonia Vaccine (1 - PCV) 10/30/2022*   Mammogram  09/27/2022   Tetanus Vaccine  04/22/2025   DEXA scan (bone density measurement)  Completed   Hepatitis C Screening: USPSTF Recommendation to screen - Ages 1844-79o.  Completed   HPV Vaccine  Aged Out  *Topic was postponed. The date shown is not the original due date.   Fall Risk: Fall Risk  08/22/2021 08/02/2020 05/11/2020 09/21/2019 04/01/2017  Falls in the past year? 0 0 0 0 No  Number falls in past yr: 0 0 0 - -  Injury with Fall? 0 0 0 - -  Risk for fall due to : No Fall Risks - - - -  Follow up Falls evaluation completed Falls prevention discussed - - -   Medications and allergies reviewed with patient and updated if appropriate.  Patient Active Problem List   Diagnosis Date Noted   Obesity 10/30/2021   S/P cataract extraction 10/30/2021  Candidal intertrigo 08/22/2021   GAD (generalized anxiety disorder) 09/21/2019   Pain in left wrist 07/24/2017   Painful wrist, right 07/24/2017   Osteoporosis 04/21/2017   Cervical stenosis of spine 04/21/2017   Vitamin D deficiency 04/21/2017   Recurrent major depressive disorder, in partial remission ([place]) 04/09/2017   Chronic low back pain 04/09/2017   Fibromyalgia     Current Outpatient Medications on File Prior to Visit  Medication Sig Dispense Refill   hydrOXYzine (ATARAX/VISTARIL) 25 MG tablet TAKE 1 TABLET (25 MG TOTAL) BY MOUTH DAILY AS NEEDED FOR ANXIETY OR ITCHING. 90 tablet 1   phentermine 15 MG capsule Take 15 mg by mouth every morning.     No current facility-administered  medications on file prior to visit.    Past Medical History:  Diagnosis Date   Anxiety    Arthritis    At high risk for tick borne illness    had abx--test her again--she was okey---[facility]   Depression    Fibromyalgia    Fibromyalgia     Past Surgical History:  Procedure Laterality Date   cataracts removal (bilateral) Bilateral    CESAREAN SECTION     CYST EXCISION Right 05/09/2017   Procedure: EXCISION RIGHT INGUINAL CYST;  Surgeon: Clovis Riley, MD;  Location: [place];  Service: General;  Laterality: Right;   JOINT REPLACEMENT     KNEE ARTHROSCOPY     KNEE SURGERY     OVARIAN CYST REMOVAL      Social History   Socioeconomic History   Marital status: Married    Spouse name: Not on file   Number of children: Not on file   Years of education: Not on file   Highest education level: Not on file  Occupational History   Occupation: Retired  Tobacco Use   Smoking status: Never   Smokeless tobacco: Never  Vaping Use   Vaping Use: Never used  Substance and Sexual Activity   Alcohol use: No   Drug use: No   Sexual activity: Not Currently    Birth control/protection: Post-menopausal  Other Topics Concern   Not on file  Social History Narrative   Not on file   Social Determinants of Health   Financial Resource Strain: Not on file  Food Insecurity: Not on file  Transportation Needs: Not on file  Physical Activity: Not on file  Stress: Not on file  Social Connections: Not on file    Family History  Problem Relation Age of Onset   Alzheimer's disease Mother    Heart disease Father    Alzheimer's disease Maternal Aunt    Alzheimer's disease Maternal Uncle    Alzheimer's disease Maternal Grandmother        Review of Systems  Constitutional:  Negative for fever, malaise/fatigue and weight loss.  HENT:  Negative for congestion and sore throat.   Eyes:        Negative for visual changes  Respiratory:  Negative for  cough and shortness of breath.   Cardiovascular:  Negative for chest pain, palpitations and leg swelling.  Gastrointestinal:  Negative for blood in stool, constipation, diarrhea and heartburn.  Genitourinary:  Negative for dysuria, frequency and urgency.  Musculoskeletal:  Positive for joint pain. Negative for falls and myalgias.       Chronic knee pain  Skin:  Negative for rash.  Neurological:  Negative for dizziness, sensory change and headaches.  Endo/Heme/Allergies:  Does not bruise/bleed easily.  Psychiatric/Behavioral:  Negative  for depression, hallucinations, memory loss, substance abuse and suicidal ideas. The patient is not nervous/anxious and does not have insomnia.    Objective:   Vitals:   10/30/21 1137  BP: 104/78  Pulse: 61  Temp: (!) 96.7 F (35.9 C)  SpO2: 97%    Body mass index is 34.58 kg/m.   Physical Examination:  Physical Exam Vitals reviewed.  Constitutional:      General: She is not in acute distress.    Appearance: She is well-developed. She is obese.  HENT:     Right Ear: Tympanic membrane, ear canal and external ear normal.     Left Ear: Tympanic membrane, ear canal and external ear normal.  Eyes:     Extraocular Movements: Extraocular movements intact.     Conjunctiva/sclera: Conjunctivae normal.  Cardiovascular:     Rate and Rhythm: Normal rate and regular rhythm.     Pulses: Normal pulses.     Heart sounds: Normal heart sounds.  Pulmonary:     Effort: Pulmonary effort is normal. No respiratory distress.     Breath sounds: Normal breath sounds.  Chest:     Chest wall: No tenderness.  Breasts:    Right: Normal.     Left: Normal.  Abdominal:     General: Bowel sounds are normal.     Palpations: Abdomen is soft.  Musculoskeletal:        General: Normal range of motion.     Cervical back: Normal range of motion and neck supple.     Right lower leg: No edema.     Left lower leg: No edema.  Lymphadenopathy:     Cervical: No cervical  adenopathy.     Upper Body:     Right upper body: No supraclavicular, axillary or pectoral adenopathy.     Left upper body: No supraclavicular, axillary or pectoral adenopathy.  Skin:    General: Skin is warm and dry.     Findings: Rash present.     Comments: Chronic rash under breast under breast and axilla region (candida)  Neurological:     Mental Status: She is alert and oriented to person, place, and time.     Deep Tendon Reflexes: Reflexes are normal and symmetric.  Psychiatric:        Mood and Affect: Mood normal.        Behavior: Behavior normal.        Thought Content: Thought content normal.    ASSESSMENT and PLAN: This visit occurred during the SARS-CoV-2 public health emergency.  Safety protocols were in place, including screening questions prior to the visit, additional usage of staff PPE, and extensive cleaning of exam room while observing appropriate contact time as indicated for disinfecting solutions.   Tiya was seen today for annual exam.  Diagnoses and all orders for this visit:  Preventative health care  Encounter for lipid screening for cardiovascular disease  Age-related osteoporosis without current pathological fracture -     DG Bone Density; Future  Breast cancer screening by mammogram -     MM 3D SCREEN BREAST BILATERAL; Future  Colon cancer screening -     Cologuard; Future  Recurrent major depressive disorder, in partial remission (HCC) -     FLUoxetine (PROZAC) 20 MG capsule; Take 2 capsules (40 mg total) by mouth daily.  Class 1 obesity due to excess calories with serious comorbidity and body mass index (BMI) of 34.0 to 34.9 in adult    Obtain labs recently completed by [facility]  clinic. Continue heart healthy diet and daily exercise. schedule appt for mammogram and bone density. Call office if you do not receive cologuard kit in next 2weeks. Sign medical release to get records from [facility] and colonoscopy report from [place].  Problem  List Items Addressed This Visit       Musculoskeletal and Integument   Osteoporosis    Repeat dexa scan Advised to start calcium and vitamin D supplement OTC      Relevant Orders   DG Bone Density     Other   Obesity    Participates in weight loss progarm through [facility] Current use of phentermine 37m, denies any adverse side effects. States she is awre of possible side effects Phentermine has helped with decreased meal portion and increased motivation to exercise. Exercise regimen: walking 33mes a day, plans to start water aerobics and use of recumbent bicycle. Lost 2lbs in last 1week. BP Readings from Last 3 Encounters:  10/30/21 104/78  08/22/21 104/74  03/30/21 110/72       Relevant Medications   phentermine 15 MG capsule   Recurrent major depressive disorder, in partial remission ([place]CMalta   Stable with fluoxetine Refill sent      Relevant Medications   FLUoxetine (PROZAC) 20 MG capsule   Other Visit Diagnoses     Preventative health care    -  Primary   Encounter for lipid screening for cardiovascular disease       Breast cancer screening by mammogram       Relevant Orders   MM 3D SCREEN BREAST BILATERAL   Colon cancer screening       Relevant Orders   Cologuard       Follow up: Return in about 1 year (around 10/30/2022) for CPE (fasting).  ChWilfred LacyNP

## 2021-10-30 NOTE — Assessment & Plan Note (Signed)
Repeat dexa scan Advised to start calcium and vitamin D supplement OTC

## 2021-10-30 NOTE — Assessment & Plan Note (Signed)
Stable with fluoxetine Refill sent

## 2021-10-30 NOTE — Assessment & Plan Note (Signed)
Participates in weight loss progarm through [facility] Current use of phentermine 15mg , denies any adverse side effects. States she is awre of possible side effects Phentermine has helped with decreased meal portion and increased motivation to exercise. Exercise regimen: walking 75miles a day, plans to start water aerobics and use of recumbent bicycle. Lost 2lbs in last 1week. BP Readings from Last 3 Encounters:  10/30/21 104/78  08/22/21 104/74  03/30/21 110/72

## 2021-11-05 DIAGNOSIS — B379 Candidiasis, unspecified: Secondary | ICD-10-CM | POA: Diagnosis not present

## 2021-11-05 DIAGNOSIS — E7800 Pure hypercholesterolemia, unspecified: Secondary | ICD-10-CM | POA: Diagnosis not present

## 2021-11-12 DIAGNOSIS — E7800 Pure hypercholesterolemia, unspecified: Secondary | ICD-10-CM | POA: Diagnosis not present

## 2021-11-19 DIAGNOSIS — R5382 Chronic fatigue, unspecified: Secondary | ICD-10-CM | POA: Diagnosis not present

## 2021-11-19 DIAGNOSIS — B372 Candidiasis of skin and nail: Secondary | ICD-10-CM | POA: Diagnosis not present

## 2021-12-03 ENCOUNTER — Encounter: Payer: Medicare Other | Admitting: Nurse Practitioner

## 2021-12-25 ENCOUNTER — Telehealth: Payer: Self-pay | Admitting: Nurse Practitioner

## 2021-12-25 NOTE — Telephone Encounter (Signed)
Left message for patient to call back and schedule Medicare Annual Wellness Visit (AWV) in office.   If not able to come in office, please offer to do virtually or by telephone.  Left office number and my jabber [phone].  Last AWV:08/02/2020  Please schedule at anytime with Nurse Health Advisor.

## 2022-01-22 ENCOUNTER — Encounter: Payer: Self-pay | Admitting: Nurse Practitioner

## 2022-01-22 DIAGNOSIS — F411 Generalized anxiety disorder: Secondary | ICD-10-CM

## 2022-01-29 ENCOUNTER — Ambulatory Visit: Payer: Medicare Other

## 2022-02-05 ENCOUNTER — Ambulatory Visit (INDEPENDENT_AMBULATORY_CARE_PROVIDER_SITE_OTHER): Payer: Medicare Other | Admitting: *Deleted

## 2022-02-05 DIAGNOSIS — Z0000 Encounter for general adult medical examination without abnormal findings: Secondary | ICD-10-CM | POA: Diagnosis not present

## 2022-02-05 NOTE — Progress Notes (Signed)
Subjective:   Betty Holmes is a 66 y.o. female who presents for Medicare Annual (Subsequent) preventive examination.  I connected with  SAMADHI MAHURIN on 02/05/22 by a  telephone enabled telemedicine application and verified that I am speaking with the correct person using two identifiers.   I discussed the limitations of evaluation and management by telemedicine. The patient expressed understanding and agreed to proceed.  Patient location: home  Provider location: Tele-Health  not in office    Review of Systems     Cardiac Risk Factors include: advanced age (>80men, >26 women);obesity (BMI >30kg/m2)     Objective:    Today's Vitals   There is no height or weight on file to calculate BMI.  Advanced Directives 02/05/2022 08/02/2020 06/22/2020 12/02/2019 07/09/2017 06/05/2017 05/16/2017  Does Patient Have a Medical Advance Directive? No No No No No No No  Would patient like information on creating a medical advance directive? No - Patient declined Yes (MAU/Ambulatory/Procedural Areas - Information given) No - Patient declined No - Patient declined - - No - Patient declined    Current Medications (verified) Outpatient Encounter Medications as of 02/05/2022  Medication Sig   FLUoxetine (PROZAC) 20 MG capsule Take 2 capsules (40 mg total) by mouth daily.   hydrOXYzine (ATARAX/VISTARIL) 25 MG tablet TAKE 1 TABLET (25 MG TOTAL) BY MOUTH DAILY AS NEEDED FOR ANXIETY OR ITCHING.   phentermine 15 MG capsule Take 15 mg by mouth every morning.   No facility-administered encounter medications on file as of 02/05/2022.    Allergies (verified) Penicillins and Mineral oil   History: Past Medical History:  Diagnosis Date   Anxiety    Arthritis    At high risk for tick borne illness    had abx--test her again--she was okey---[facility]   Depression    Fibromyalgia    Fibromyalgia    Past Surgical History:  Procedure Laterality Date   cataracts removal  (bilateral) Bilateral    CESAREAN SECTION     CYST EXCISION Right 05/09/2017   Procedure: EXCISION RIGHT INGUINAL CYST;  Surgeon: Clovis Riley, MD;  Location: [facility];  Service: General;  Laterality: Right;   JOINT REPLACEMENT     KNEE ARTHROSCOPY     KNEE SURGERY     OVARIAN CYST REMOVAL     Family History  Problem Relation Age of Onset   Alzheimer's disease Mother    Heart disease Father    Alzheimer's disease Maternal Aunt    Alzheimer's disease Maternal Uncle    Alzheimer's disease Maternal Grandmother    Social History   Socioeconomic History   Marital status: Married    Spouse name: Not on file   Number of children: Not on file   Years of education: Not on file   Highest education level: Not on file  Occupational History   Occupation: Retired  Tobacco Use   Smoking status: Never   Smokeless tobacco: Never  Vaping Use   Vaping Use: Never used  Substance and Sexual Activity   Alcohol use: No   Drug use: No   Sexual activity: Not Currently    Birth control/protection: Post-menopausal  Other Topics Concern   Not on file  Social History Narrative   Not on file   Social Determinants of Health   Financial Resource Strain: Low Risk    Difficulty of Paying Living Expenses: Not hard at all  Food Insecurity: No Food Insecurity   Worried About Running Out  of Food in the Last Year: Never true   [place] in the Last Year: Never true  Transportation Needs: No Transportation Needs   Lack of Transportation (Medical): No   Lack of Transportation (Non-Medical): No  Physical Activity: Insufficiently Active   Days of Exercise per Week: 3 days   Minutes of Exercise per Session: 30 min  Stress: No Stress Concern Present   Feeling of Stress : Not at all  Social Connections: Moderately Integrated   Frequency of Communication with Friends and Family: More than three times a week   Frequency of Social Gatherings with Friends and Family: Twice a week    Attends Religious Services: More than 4 times per year   Active Member of [employer] or Organizations: No   Attends Music therapist: Never   Marital Status: Married    Tobacco Counseling Counseling given: Not Answered   Clinical Intake:  Pre-visit preparation completed: Yes  Pain : No/denies pain     Nutritional Risks: None Diabetes: No  How often do you need to have someone help you when you read instructions, pamphlets, or other written materials from your doctor or pharmacy?: 1 - Never  Diabetic?  no  Interpreter Needed?: No  Information entered by :: Leroy Kennedy LPN   Activities of Daily Living In your present state of health, do you have any difficulty performing the following activities: 02/05/2022  Hearing? N  Vision? N  Difficulty concentrating or making decisions? N  Walking or climbing stairs? N  Dressing or bathing? N  Preparing Food and eating ? N  Using the Toilet? N  In the past six months, have you accidently leaked urine? N  Do you have problems with loss of bowel control? N  Managing your Medications? N  Managing your Finances? N  Housekeeping or managing your Housekeeping? N  Some recent data might be hidden    Patient Care Team: Nche, Charlene Brooke, NP as PCP - General (Internal Medicine)  Indicate any recent Medical Services you may have received from other than [facility] providers in the past year (date may be approximate).     Assessment:   This is a routine wellness examination for [place].  Hearing/Vision screen Hearing Screening - Comments:: Has hearing aids Does not wear them  everything still sounds muffled.. Vision Screening - Comments:: Up to date [facility]  Dietary issues and exercise activities discussed: Current Exercise Habits: Home exercise routine, Type of exercise: walking, Time (Minutes): 30, Frequency (Times/Week): 5, Weekly Exercise (Minutes/Week): 150, Intensity: Mild   Goals Addressed             This  Visit's Progress    Weight (lb) < 200 lb (90.7 kg)         Depression Screen PHQ 2/9 Scores 02/05/2022 08/22/2021 03/30/2021 08/02/2020 05/11/2020 01/05/2020 09/21/2019  PHQ - 2 Score 3 4 2  0 2 6 2   PHQ- 9 Score 13 13 10  - 8 22 21     Fall Risk Fall Risk  02/05/2022 08/22/2021 08/02/2020 05/11/2020 09/21/2019  Falls in the past year? 0 0 0 0 0  Number falls in past yr: 0 0 0 0 -  Injury with Fall? 0 0 0 0 -  Risk for fall due to : - No Fall Risks - - -  Follow up Falls evaluation completed;Falls prevention discussed Falls evaluation completed Falls prevention discussed - -    FALL RISK PREVENTION PERTAINING TO THE HOME:  Any stairs in or around the  home? No  If so, are there any without handrails? No  Home free of loose throw rugs in walkways, pet beds, electrical cords, etc? Yes  Adequate lighting in your home to reduce risk of falls? Yes   ASSISTIVE DEVICES UTILIZED TO PREVENT FALLS:  Life alert? No  Use of a cane, walker or w/c? No  Grab bars in the bathroom? No  Shower chair or bench in shower? No  Elevated toilet seat or a handicapped toilet? No   TIMED UP AND GO:  Was the test performed? No .     Cognitive Function:  Normal cognitive status assessed by direct observation by this Nurse Health Advisor. No abnormalities found.          Immunizations Immunization History  Administered Date(s) Administered   PFIZER(Purple Top)SARS-COV-2 Vaccination 04/27/2020, 05/18/2020, 11/02/2020    TDAP status: Due, Education has been provided regarding the importance of this vaccine. Advised may receive this vaccine at local pharmacy or Health Dept. Aware to provide a copy of the vaccination record if obtained from local pharmacy or Health Dept. Verbalized acceptance and understanding.  Flu Vaccine status: Due, Education has been provided regarding the importance of this vaccine. Advised may receive this vaccine at local pharmacy or Health Dept. Aware to provide a copy of the  vaccination record if obtained from local pharmacy or Health Dept. Verbalized acceptance and understanding.  Pneumococcal vaccine status: Declined,  Education has been provided regarding the importance of this vaccine but patient still declined. Advised may receive this vaccine at local pharmacy or Health Dept. Aware to provide a copy of the vaccination record if obtained from local pharmacy or Health Dept. Verbalized acceptance and understanding.   Covid-19 vaccine status: Information provided on how to obtain vaccines.   Qualifies for Shingles Vaccine? Yes   Zostavax completed No   Shingrix Completed?: No.    Education has been provided regarding the importance of this vaccine. Patient has been advised to call insurance company to determine out of pocket expense if they have not yet received this vaccine. Advised may also receive vaccine at local pharmacy or Health Dept. Verbalized acceptance and understanding.  Screening Tests Health Maintenance  Topic Date Due   Fecal DNA (Cologuard)  Never done   COLON CANCER SCREENING ANNUAL FOBT  11/11/2018   COVID-19 Vaccine (4 - Booster for Pfizer series) 12/28/2020   INFLUENZA VACCINE  03/22/2022 (Originally 07/23/2021)   Zoster Vaccines- Shingrix (1 of 2) 05/05/2022 (Originally 10/10/2004)   Pneumonia Vaccine 29+ Years old (1 - PCV) 10/30/2022 (Originally 10/11/2019)   MAMMOGRAM  09/27/2022   TETANUS/TDAP  04/22/2025   DEXA SCAN  Completed   Hepatitis C Screening  Completed   HPV VACCINES  Aged Out   COLONOSCOPY (Pts 45-4yrs Insurance coverage will need to be confirmed)  Discontinued    Health Maintenance  Health Maintenance Due  Topic Date Due   Fecal DNA (Cologuard)  Never done   COLON CANCER SCREENING ANNUAL FOBT  11/11/2018   COVID-19 Vaccine (4 - Booster for [place] series) 12/28/2020    Colorectal cancer screening: Type of screening: Colonoscopy. Completed  . Repeat every due 2025 years  Mammogram status: Ordered  . Pt provided  with contact info and advised to call to schedule appt.   Bone Density status: Ordered  . Pt provided with contact info and advised to call to schedule appt.  Lung Cancer Screening: (Low Dose CT Chest recommended if Age 56-80 years, 30 pack-year currently smoking OR have quit  w/in 15years.) does not qualify.   Lung Cancer Screening Referral:   Additional Screening:  Hepatitis C Screening: does not qualify; Completed 2018  Vision Screening: Recommended annual ophthalmology exams for early detection of glaucoma and other disorders of the eye. Is the patient up to date with their annual eye exam?  Yes  Who is the provider or what is the name of the office in which the patient attends annual eye exams? [facility] If pt is not established with a provider, would they like to be referred to a provider to establish care? No .   Dental Screening: Recommended annual dental exams for proper oral hygiene  Community Resource Referral / Chronic Care Management: CRR required this visit?  No   CCM required this visit?  No      Plan:     I have personally reviewed and noted the following in the patients chart:   Medical and social history Use of alcohol, tobacco or illicit drugs  Current medications and supplements including opioid prescriptions.  Functional ability and status Nutritional status Physical activity Advanced directives List of other physicians Hospitalizations, surgeries, and ER visits in previous 12 months Vitals Screenings to include cognitive, depression, and falls Referrals and appointments  In addition, I have reviewed and discussed with patient certain preventive protocols, quality metrics, and best practice recommendations. A written personalized care plan for preventive services as well as general preventive health recommendations were provided to patient.     Leroy Kennedy, LPN   [id number]   Nurse Notes:

## 2022-02-05 NOTE — Patient Instructions (Signed)
Betty Holmes , Thank you for taking time to come for your Medicare Wellness Visit. I appreciate your ongoing commitment to your health goals. Please review the following plan we discussed and let me know if I can assist you in the future.   Screening recommendations/referrals: Colonoscopy: up to date Mammogram: Education provided Bone Density: Education provided Recommended yearly ophthalmology/optometry visit for glaucoma screening and checkup Recommended yearly dental visit for hygiene and checkup  Vaccinations: Influenza vaccine:  Pneumococcal vaccine:  Tdap vaccine:  Shingles vaccine:     Advanced directives: Education provided  Conditions/risks identified:   Next appointment:    Preventive Care 74 Years and Older, Female Preventive care refers to lifestyle choices and visits with your health care provider that can promote health and wellness. What does preventive care include? A yearly physical exam. This is also called an annual well check. Dental exams once or twice a year. Routine eye exams. Ask your health care provider how often you should have your eyes checked. Personal lifestyle choices, including: Daily care of your teeth and gums. Regular physical activity. Eating a healthy diet. Avoiding tobacco and drug use. Limiting alcohol use. Practicing safe sex. Taking low-dose aspirin every day. Taking vitamin and mineral supplements as recommended by your health care provider. What happens during an annual well check? The services and screenings done by your health care provider during your annual well check will depend on your age, overall health, lifestyle risk factors, and family history of disease. Counseling  Your health care provider may ask you questions about your: Alcohol use. Tobacco use. Drug use. Emotional well-being. Home and relationship well-being. Sexual activity. Eating habits. History of falls. Memory and ability to understand  (cognition). Work and work Statistician. Reproductive health. Screening  You may have the following tests or measurements: Height, weight, and BMI. Blood pressure. Lipid and cholesterol levels. These may be checked every 5 years, or more frequently if you are over 80 years old. Skin check. Lung cancer screening. You may have this screening every year starting at age 36 if you have a 30-pack-year history of smoking and currently smoke or have quit within the past 15 years. Fecal occult blood test (FOBT) of the stool. You may have this test every year starting at age 57. Flexible sigmoidoscopy or colonoscopy. You may have a sigmoidoscopy every 5 years or a colonoscopy every 10 years starting at age 2. Hepatitis C blood test. Hepatitis B blood test. Sexually transmitted disease (STD) testing. Diabetes screening. This is done by checking your blood sugar (glucose) after you have not eaten for a while (fasting). You may have this done every 1-3 years. Bone density scan. This is done to screen for osteoporosis. You may have this done starting at age 102. Mammogram. This may be done every 1-2 years. Talk to your health care provider about how often you should have regular mammograms. Talk with your health care provider about your test results, treatment options, and if necessary, the need for more tests. Vaccines  Your health care provider may recommend certain vaccines, such as: Influenza vaccine. This is recommended every year. Tetanus, diphtheria, and acellular pertussis (Tdap, Td) vaccine. You may need a Td booster every 10 years. Zoster vaccine. You may need this after age 54. Pneumococcal 13-valent conjugate (PCV13) vaccine. One dose is recommended after age 19. Pneumococcal polysaccharide (PPSV23) vaccine. One dose is recommended after age 37. Talk to your health care provider about which screenings and vaccines you need and how often you need  them. This information is not intended to  replace advice given to you by your health care provider. Make sure you discuss any questions you have with your health care provider. Document Released: 01/05/2016 Document Revised: 08/28/2016 Document Reviewed: 10/10/2015 Elsevier Interactive Patient Education  2017 [place] Prevention in the Home Falls can cause injuries. They can happen to people of all ages. There are many things you can do to make your home safe and to help prevent falls. What can I do on the outside of my home? Regularly fix the edges of walkways and driveways and fix any cracks. Remove anything that might make you trip as you walk through a door, such as a raised step or threshold. Trim any bushes or trees on the path to your home. Use bright outdoor lighting. Clear any walking paths of anything that might make someone trip, such as rocks or tools. Regularly check to see if handrails are loose or broken. Make sure that both sides of any steps have handrails. Any raised decks and porches should have guardrails on the edges. Have any leaves, snow, or ice cleared regularly. Use sand or salt on walking paths during winter. Clean up any spills in your garage right away. This includes oil or grease spills. What can I do in the bathroom? Use night lights. Install grab bars by the toilet and in the tub and shower. Do not use towel bars as grab bars. Use non-skid mats or decals in the tub or shower. If you need to sit down in the shower, use a plastic, non-slip stool. Keep the floor dry. Clean up any water that spills on the floor as soon as it happens. Remove soap buildup in the tub or shower regularly. Attach bath mats securely with double-sided non-slip rug tape. Do not have throw rugs and other things on the floor that can make you trip. What can I do in the bedroom? Use night lights. Make sure that you have a light by your bed that is easy to reach. Do not use any sheets or blankets that are too big for  your bed. They should not hang down onto the floor. Have a firm chair that has side arms. You can use this for support while you get dressed. Do not have throw rugs and other things on the floor that can make you trip. What can I do in the kitchen? Clean up any spills right away. Avoid walking on wet floors. Keep items that you use a lot in easy-to-reach places. If you need to reach something above you, use a strong step stool that has a grab bar. Keep electrical cords out of the way. Do not use floor polish or wax that makes floors slippery. If you must use wax, use non-skid floor wax. Do not have throw rugs and other things on the floor that can make you trip. What can I do with my stairs? Do not leave any items on the stairs. Make sure that there are handrails on both sides of the stairs and use them. Fix handrails that are broken or loose. Make sure that handrails are as long as the stairways. Check any carpeting to make sure that it is firmly attached to the stairs. Fix any carpet that is loose or worn. Avoid having throw rugs at the top or bottom of the stairs. If you do have throw rugs, attach them to the floor with carpet tape. Make sure that you have a light switch at  the top of the stairs and the bottom of the stairs. If you do not have them, ask someone to add them for you. What else can I do to help prevent falls? Wear shoes that: Do not have high heels. Have rubber bottoms. Are comfortable and fit you well. Are closed at the toe. Do not wear sandals. If you use a stepladder: Make sure that it is fully opened. Do not climb a closed stepladder. Make sure that both sides of the stepladder are locked into place. Ask someone to hold it for you, if possible. Clearly mark and make sure that you can see: Any grab bars or handrails. First and last steps. Where the edge of each step is. Use tools that help you move around (mobility aids) if they are needed. These  include: Canes. Walkers. Scooters. Crutches. Turn on the lights when you go into a dark area. Replace any light bulbs as soon as they burn out. Set up your furniture so you have a clear path. Avoid moving your furniture around. If any of your floors are uneven, fix them. If there are any pets around you, be aware of where they are. Review your medicines with your doctor. Some medicines can make you feel dizzy. This can increase your chance of falling. Ask your doctor what other things that you can do to help prevent falls. This information is not intended to replace advice given to you by your health care provider. Make sure you discuss any questions you have with your health care provider. Document Released: 10/05/2009 Document Revised: 05/16/2016 Document Reviewed: 01/13/2015 Elsevier Interactive Patient Education  2017 [employer].

## 2022-02-13 MED ORDER — HYDROXYZINE HCL 25 MG PO TABS: 25.0000 mg | ORAL_TABLET | Freq: Every day | ORAL | 1 refills | Status: DC | PRN

## 2022-07-02 ENCOUNTER — Encounter: Payer: Self-pay | Admitting: Nurse Practitioner

## 2022-07-02 ENCOUNTER — Ambulatory Visit (INDEPENDENT_AMBULATORY_CARE_PROVIDER_SITE_OTHER): Payer: Medicare Other | Admitting: Nurse Practitioner

## 2022-07-02 VITALS — BP 118/78 | HR 62 | Temp 97.0°F | Ht 63.0 in | Wt 200.2 lb

## 2022-07-02 DIAGNOSIS — M85852 Other specified disorders of bone density and structure, left thigh: Secondary | ICD-10-CM | POA: Diagnosis not present

## 2022-07-02 DIAGNOSIS — M85851 Other specified disorders of bone density and structure, right thigh: Secondary | ICD-10-CM | POA: Diagnosis not present

## 2022-07-02 DIAGNOSIS — Z1211 Encounter for screening for malignant neoplasm of colon: Secondary | ICD-10-CM | POA: Diagnosis not present

## 2022-07-02 DIAGNOSIS — F3341 Major depressive disorder, recurrent, in partial remission: Secondary | ICD-10-CM | POA: Diagnosis not present

## 2022-07-02 DIAGNOSIS — Z136 Encounter for screening for cardiovascular disorders: Secondary | ICD-10-CM

## 2022-07-02 DIAGNOSIS — Z13220 Encounter for screening for lipoid disorders: Secondary | ICD-10-CM

## 2022-07-02 DIAGNOSIS — M797 Fibromyalgia: Secondary | ICD-10-CM | POA: Diagnosis not present

## 2022-07-02 LAB — LIPID PANEL
Cholesterol: 198 mg/dL (ref 0–200)
HDL: 68.6 mg/dL (ref >39.00)
LDL Cholesterol: 117 mg/dL — ABNORMAL HIGH (ref 0–99)
NonHDL: 129.18
Total CHOL/HDL Ratio: 3
Triglycerides: 59 mg/dL (ref 0.0–149.0)
VLDL: 11.8 mg/dL (ref 0.0–40.0)

## 2022-07-02 LAB — BASIC METABOLIC PANEL
BUN: 15 mg/dL (ref 6–23)
CO2: 25 mEq/L (ref 19–32)
Calcium: 9.3 mg/dL (ref 8.4–10.5)
Chloride: 103 mEq/L (ref 96–112)
Creatinine, Ser: 0.64 mg/dL (ref 0.40–1.20)
GFR: 91.25 mL/min (ref >60.00)
Glucose, Bld: 93 mg/dL (ref 70–99)
Potassium: 4 mEq/L (ref 3.5–5.1)
Sodium: 137 mEq/L (ref 135–145)

## 2022-07-02 MED ORDER — FLUOXETINE HCL 20 MG PO CAPS: 40.0000 mg | ORAL_CAPSULE | Freq: Every day | ORAL | 3 refills | Status: DC

## 2022-07-02 NOTE — Assessment & Plan Note (Addendum)
Reports recurrent hip pain, Knee pain and generalized fatigue. Associated with stiffness if prolonged inactivity. No joint swelling or effusion Hx of positive RH-factor: had evaluation by rheumatology-Dr. Darlin Coco 2019, x-ray hands (no derformity, OA), x-ray knees (OA, need for left arthroplasty but declined, cortisone injection administered in past), dexa scan (osteopenia), hx of cervical spine stenosis (declined surgery). positive tender points (diagnosed with fibromyalgia). Unable to tolerate lyrica in past Pain improved with mobic, but reports rebound headache when dose is missed. Mo improvement with ibuprofen Some improvement with excedrin Improved pain low impact exercise (water aerobics and recumbent bicycle). Exercise discontinue for several months while visitng daughter in [place].  Advised to resume low impact cardio exercise and leg strengthening exercises. Use naproxen or excedrin as needed for pain, advised not to combine medications and to take with food.

## 2022-07-02 NOTE — Assessment & Plan Note (Addendum)
Stable mood with fluoxetine daily and vistaril prn She tried to wean of medication because of husband's disapproval. Reports worsening depression and anxiety with decreased dose or missing dose. We discussed pros and cons of medication, and ways to wean of medications. She opted to continue medications at this time. I also advised about need for counseling sessions. She declined at this time.  Maintain fluoxetine dose F/up in 3-72month

## 2022-07-02 NOTE — Progress Notes (Signed)
Established Patient Visit  Patient: Betty Holmes   DOB: [birth year]   66 y.o. Female  MRN: [id number] Visit Date: 07/02/2022  Subjective:    Chief Complaint  Patient presents with   Office Visit    Prozac F/u, says she has been trying to ween herself off of it. Has been taking 1 a day, Says she was having bad side affects one she came off.   HPI Fibromyalgia Reports recurrent hip pain, Knee pain and generalized fatigue. Associated with stiffness if prolonged inactivity. No joint swelling or effusion Hx of positive RH-factor: had evaluation by rheumatology-Dr. Darlin Coco 2019, x-ray hands (no derformity, OA), x-ray knees (OA, need for left arthroplasty but declined, cortisone injection administered in past), dexa scan (osteopenia), hx of cervical spine stenosis (declined surgery). positive tender points (diagnosed with fibromyalgia). Unable to tolerate lyrica in past Pain improved with mobic, but reports rebound headache when dose is missed. Mo improvement with ibuprofen Some improvement with excedrin Improved pain low impact exercise (water aerobics and recumbent bicycle). Exercise discontinue for several months while visitng daughter in [place].  Advised to resume low impact cardio exercise and leg strengthening exercises. Use naproxen or excedrin as needed for pain, advised not to combine medications and to take with food.  Osteopenia Last dexa scan 2018 Advised to maintain calcium 600-'800mg'$  and vit. D 1000IU daily. Repeat dexa scan  Recurrent major depressive disorder, in partial remission (HCC) Stable mood with fluoxetine daily and vistaril prn She tried to wean of medication because of husband's disapproval. Reports worsening depression and anxiety with decreased dose or missing dose. We discussed pros and cons of medication, and ways to wean of medications. She opted to continue medications at this time. I also advised about need for counseling sessions. She  declined at this time.  Maintain fluoxetine dose F/up in 3-77month  Reviewed medical, surgical, and social history today  Medications: Outpatient Medications Prior to Visit  Medication Sig   hydrOXYzine (ATARAX) 25 MG tablet Take 1 tablet (25 mg total) by mouth daily as needed for anxiety or itching.   [DISCONTINUED] FLUoxetine (PROZAC) 20 MG capsule Take 2 capsules (40 mg total) by mouth daily.   [DISCONTINUED] phentermine 15 MG capsule Take 15 mg by mouth every morning.   No facility-administered medications prior to visit.   Reviewed past medical and social history.   ROS per HPI above      Objective:  BP 118/78 (BP Location: Right Arm, Patient Position: Sitting, Cuff Size: Normal)   Pulse 62   Temp (!) 97 F (36.1 C) (Temporal)   Ht '5\' 3"'$  (1.6 m)   Wt 200 lb 3.2 oz (90.8 kg)   SpO2 93%   BMI 35.46 kg/m      Physical Exam Vitals and nursing note reviewed.  Cardiovascular:     Rate and Rhythm: Normal rate and regular rhythm.     Pulses: Normal pulses.     Heart sounds: Normal heart sounds.  Pulmonary:     Effort: Pulmonary effort is normal.     Breath sounds: Normal breath sounds.  Musculoskeletal:     Right lower leg: No edema.     Left lower leg: No edema.  Neurological:     Mental Status: She is alert and oriented to person, place, and time.     No results found for any visits on 07/02/22.    Assessment & Plan:  Problem List Items Addressed This Visit       Musculoskeletal and Integument   Osteopenia    Last dexa scan 2018 Advised to maintain calcium 600-'800mg'$  and vit. D 1000IU daily. Repeat dexa scan      Relevant Orders   DG Bone Density     Other   Fibromyalgia    Reports recurrent hip pain, Knee pain and generalized fatigue. Associated with stiffness if prolonged inactivity. No joint swelling or effusion Hx of positive RH-factor: had evaluation by rheumatology-Dr. Darlin Coco 2019, x-ray hands (no derformity, OA), x-ray knees (OA,  need for left arthroplasty but declined, cortisone injection administered in past), dexa scan (osteopenia), hx of cervical spine stenosis (declined surgery). positive tender points (diagnosed with fibromyalgia). Unable to tolerate lyrica in past Pain improved with mobic, but reports rebound headache when dose is missed. Mo improvement with ibuprofen Some improvement with excedrin Improved pain low impact exercise (water aerobics and recumbent bicycle). Exercise discontinue for several months while visitng daughter in [place].  Advised to resume low impact cardio exercise and leg strengthening exercises. Use naproxen or excedrin as needed for pain, advised not to combine medications and to take with food.      Relevant Medications   FLUoxetine (PROZAC) 20 MG capsule   Recurrent major depressive disorder, in partial remission (HCC)    Stable mood with fluoxetine daily and vistaril prn She tried to wean of medication because of husband's disapproval. Reports worsening depression and anxiety with decreased dose or missing dose. We discussed pros and cons of medication, and ways to wean of medications. She opted to continue medications at this time. I also advised about need for counseling sessions. She declined at this time.  Maintain fluoxetine dose F/up in 3-38month      Relevant Medications   FLUoxetine (PROZAC) 20 MG capsule   Other Relevant Orders   Basic metabolic panel   Other Visit Diagnoses     Colon cancer screening    -  Primary   Relevant Orders   Cologuard   Encounter for lipid screening for cardiovascular disease       Relevant Orders   Lipid panel      Return in about 6 months (around 01/02/2023) for anxiety and depression.     CWilfred Lacy NP

## 2022-07-02 NOTE — Patient Instructions (Addendum)
You will be contacted about cologuard order Sign medical release to get records from previous GI.  Go to lab Maintain med doses  Resume low impact exercise.

## 2022-07-02 NOTE — Assessment & Plan Note (Addendum)
Last dexa scan 2018 Advised to maintain calcium 600-'800mg'$  and vit. D 1000IU daily. Repeat dexa scan

## 2022-07-08 ENCOUNTER — Ambulatory Visit (INDEPENDENT_AMBULATORY_CARE_PROVIDER_SITE_OTHER): Payer: Medicare Other | Admitting: Podiatry

## 2022-07-08 ENCOUNTER — Encounter: Payer: Self-pay | Admitting: Podiatry

## 2022-07-08 ENCOUNTER — Ambulatory Visit (INDEPENDENT_AMBULATORY_CARE_PROVIDER_SITE_OTHER): Payer: Medicare Other

## 2022-07-08 DIAGNOSIS — M2041 Other hammer toe(s) (acquired), right foot: Secondary | ICD-10-CM | POA: Diagnosis not present

## 2022-07-08 DIAGNOSIS — L600 Ingrowing nail: Secondary | ICD-10-CM | POA: Diagnosis not present

## 2022-07-08 DIAGNOSIS — M778 Other enthesopathies, not elsewhere classified: Secondary | ICD-10-CM

## 2022-07-08 DIAGNOSIS — M2042 Other hammer toe(s) (acquired), left foot: Secondary | ICD-10-CM | POA: Diagnosis not present

## 2022-07-08 MED ORDER — TRIAMCINOLONE ACETONIDE 10 MG/ML IJ SUSP
10.0000 mg | Freq: Once | INTRAMUSCULAR | Status: AC
  Administered 2022-07-08: 10 mg

## 2022-07-08 NOTE — Patient Instructions (Signed)

## 2022-07-08 NOTE — Progress Notes (Signed)
Subjective:   Patient ID: Betty Holmes, female   DOB: 66 y.o.   MRN: [id number]   HPI Patient presents with numerous different problems with 1 being chronic ingrown toenail of the right big toe medial border painful #2 being abnormal position of the left second toe that gets sore #3 pain on top of the foot right over left.  States that she is tried wider shoes she is tried soaks and ice without relief of symptoms and patient does not smoke likes to be active   Review of Systems  All other systems reviewed and are negative.       Objective:  Physical Exam Vitals and nursing note reviewed.  Constitutional:      Appearance: She is well-developed.  Pulmonary:     Effort: Pulmonary effort is normal.  Musculoskeletal:        General: Normal range of motion.  Skin:    General: Skin is warm.  Neurological:     Mental Status: She is alert.     Neurovascular status intact muscle strength found to be adequate range of motion within normal limits.  Patient is found to have an incurvated medial border right hallux painful when pressed that makes it hard to wear shoe gear comfortably inflammation pain of the midtarsal joint right with fluid buildup and medial dislocation of the second digit left with mild discomfort within the joint surface.  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  At this point I recommended correction of the ingrown toenail due to the length of time its been bothering her along with trying to reduce the inflammation midtarsal joint right and at 1 point consideration for surgery of the left.  I went ahead  and I discussed the ingrown toenail allowed her to read consent form explaining procedure risk and she wants to go ahead with this.  She signed consent form and today I infiltrated the right hallux 60 mg Xylocaine Marcaine mixture sterile prep done using sterile instrumentation remove the medial border exposed matrix applied phenol 3 applications 30 seconds followed by alcohol lavage sterile dressing and instructed on leaving it on 24 hours but taken off earlier if throbbing occurred.  For the dorsal right foot I went ahead and I explained injection and risk of tendon or other issues and she is willing to accept and I did sterile prep and injected the extensor complex and the midtarsal joint 3 mg Dexasone Kenalog 5 mg Xylocaine in the left second toe we will leave alone currently but may ultimately require repositioning if symptoms were to persist or get worse  X-rays indicate that there is medial dislocation second digit left and there is some indications of arthritis of the midtarsal joint on the right foot

## 2022-07-15 DIAGNOSIS — H40013 Open angle with borderline findings, low risk, bilateral: Secondary | ICD-10-CM | POA: Diagnosis not present

## 2022-07-15 DIAGNOSIS — H43813 Vitreous degeneration, bilateral: Secondary | ICD-10-CM | POA: Diagnosis not present

## 2022-07-15 DIAGNOSIS — H52203 Unspecified astigmatism, bilateral: Secondary | ICD-10-CM | POA: Diagnosis not present

## 2022-07-15 DIAGNOSIS — Z961 Presence of intraocular lens: Secondary | ICD-10-CM | POA: Diagnosis not present

## 2022-08-12 ENCOUNTER — Other Ambulatory Visit: Payer: Self-pay | Admitting: Nurse Practitioner

## 2022-08-12 DIAGNOSIS — F411 Generalized anxiety disorder: Secondary | ICD-10-CM

## 2022-08-12 NOTE — Telephone Encounter (Signed)
Chart supports Rx Last OV: 06/2022 Next OV: not scheduled

## 2022-08-14 ENCOUNTER — Encounter: Payer: Self-pay | Admitting: Nurse Practitioner

## 2022-08-15 ENCOUNTER — Other Ambulatory Visit: Payer: Self-pay

## 2022-08-15 DIAGNOSIS — Z1211 Encounter for screening for malignant neoplasm of colon: Secondary | ICD-10-CM

## 2022-08-28 DIAGNOSIS — Z1211 Encounter for screening for malignant neoplasm of colon: Secondary | ICD-10-CM | POA: Diagnosis not present

## 2022-09-01 LAB — COLOGUARD: COLOGUARD: NEGATIVE

## 2022-09-27 ENCOUNTER — Encounter: Payer: Self-pay | Admitting: Nurse Practitioner

## 2022-10-14 ENCOUNTER — Ambulatory Visit (INDEPENDENT_AMBULATORY_CARE_PROVIDER_SITE_OTHER): Payer: Medicare Other | Admitting: Nurse Practitioner

## 2022-10-14 ENCOUNTER — Encounter: Payer: Self-pay | Admitting: Nurse Practitioner

## 2022-10-14 VITALS — BP 120/80 | HR 61 | Temp 96.9°F | Ht 63.0 in | Wt 197.6 lb

## 2022-10-14 DIAGNOSIS — F3341 Major depressive disorder, recurrent, in partial remission: Secondary | ICD-10-CM | POA: Diagnosis not present

## 2022-10-14 DIAGNOSIS — K529 Noninfective gastroenteritis and colitis, unspecified: Secondary | ICD-10-CM

## 2022-10-14 DIAGNOSIS — F411 Generalized anxiety disorder: Secondary | ICD-10-CM

## 2022-10-14 DIAGNOSIS — K5909 Other constipation: Secondary | ICD-10-CM | POA: Insufficient documentation

## 2022-10-14 MED ORDER — FLUOXETINE HCL 10 MG PO CAPS: 10.0000 mg | ORAL_CAPSULE | Freq: Every day | ORAL | 0 refills | Status: DC

## 2022-10-14 NOTE — Patient Instructions (Addendum)
Take fluoxetine '20mg'$  daily x 19month then '10mg'$  daily x 115monththen '10mg'$  every other day x 1week, then stop For diarrhea: avoid diary, about simple carbs and sugar. Try metamucil or benfiber 1Tsp in 16oz of water daily

## 2022-10-14 NOTE — Assessment & Plan Note (Addendum)
Waxing and waning, onset several years ago. She thinks this might be related to use of fluoxetine. No blood in stool, no ABD pain or nausea, no weight loss. No improvement with gluten free diet  Advised to avoid diary, keep food log. Try metamucil or benfiber 1Tsp in 16oz of water daily. Consider stool studies,if no improvement with discontinuation of fluoxetine

## 2022-10-14 NOTE — Progress Notes (Signed)
Established Patient Visit  Patient: Betty Holmes   DOB: 1956-02-11   66 y.o. Female  MRN: [id number] Visit Date: 10/14/2022  Subjective:    Chief Complaint  Patient presents with   URI    Pt would like to taper off of Prozac  Says depression has been better  No concerns    HPI Chronic diarrhea Waxing and waning, onset several years ago. She thinks this might be related to use of fluoxetine. No blood in stool, no ABD pain or nausea, no weight loss. No improvement with gluten free diet  Advised to avoid diary, keep food log. Try metamucil or benfiber 1Tsp in 16oz of water daily. Consider stool studies,if no improvement with discontinuation of fluoxetine   Recurrent major depressive disorder, in partial remission ([place]) She wants to wean off fluoxetine '40mg'$ . States mood has been stable for last 37month. She has already decreased dose to '20mg'$  daily for last 1week. She denies any adverse side effects. She wants to wean medication over the next 2-355month  Provided weaning instructions: '20mg'$  daily x 29m529monthhen '10mg'$  daily x 29mo24monthen '10mg'$  every other day x 1week, then stop. F/up in 3mon76monthviewed medical, surgical, and social history today  Medications: Outpatient Medications Prior to Visit  Medication Sig   hydrOXYzine (ATARAX) 25 MG tablet TAKE 1 TABLET (25 MG TOTAL) BY MOUTH DAILY AS NEEDED FOR ANXIETY OR ITCHING.   [DISCONTINUED] FLUoxetine (PROZAC) 20 MG capsule Take 2 capsules (40 mg total) by mouth daily.   No facility-administered medications prior to visit.   Reviewed past medical and social history.   ROS per HPI above  Last CBC Lab Results  Component Value Date   WBC 3.6 10/08/2021   HGB 13.5 10/08/2021   HCT 42 10/08/2021   MCV 88.0 08/22/2021   MCH 29.4 04/11/2017   RDW 13.3 08/22/2021   PLT 184.0 07/3176/93/9030st metabolic panel Lab Results  Component Value Date   GLUCOSE 93 07/02/2022   NA 137 07/02/2022   K 4.0  07/02/2022   CL 103 07/02/2022   CO2 25 07/02/2022   BUN 15 07/02/2022   CREATININE 0.64 07/02/2022   GFRNONAA 89 10/08/2021   CALCIUM 9.3 07/02/2022   PROT 6.0 03/30/2021   ALBUMIN 4.0 10/08/2021   BILITOT 0.6 03/30/2021   ALKPHOS 48 10/08/2021   AST 25 10/08/2021   ALT 17 10/08/2021   ANIONGAP 6 03/24/2017   Last thyroid functions Lab Results  Component Value Date   TSH 1.17 10/08/2021      Objective:  BP 120/80 (BP Location: Right Arm, Patient Position: Sitting, Cuff Size: Normal)   Pulse 61   Temp (!) 96.9 F (36.1 C) (Temporal)   Ht '5\' 3"'$  (1.6 m)   Wt 197 lb 9.6 oz (89.6 kg)   SpO2 97%   BMI 35.00 kg/m      Physical Exam Neurological:     Mental Status: She is alert and oriented to person, place, and time.  Psychiatric:        Mood and Affect: Mood normal.        Behavior: Behavior normal.     No results found for any visits on 10/14/22.    Assessment & Plan:    Problem List Items Addressed This Visit       Digestive   Chronic diarrhea    Waxing and waning, onset several years ago. She  thinks this might be related to use of fluoxetine. No blood in stool, no ABD pain or nausea, no weight loss. No improvement with gluten free diet  Advised to avoid diary, keep food log. Try metamucil or benfiber 1Tsp in 16oz of water daily. Consider stool studies,if no improvement with discontinuation of fluoxetine         Other   GAD (generalized anxiety disorder) - Primary   Relevant Medications   FLUoxetine (PROZAC) 10 MG capsule   Recurrent major depressive disorder, in partial remission ([place])    She wants to wean off fluoxetine '40mg'$ . States mood has been stable for last 89month. She has already decreased dose to '20mg'$  daily for last 1week. She denies any adverse side effects. She wants to wean medication over the next 2-339month  Provided weaning instructions: '20mg'$  daily x 53m15monthhen '10mg'$  daily x 53mo68monthen '10mg'$  every other day x 1week, then stop. F/up  in 3mon56month  Relevant Medications   FLUoxetine (PROZAC) 10 MG capsule   Return in about 3 months (around 01/14/2023) for depression and anxiety.     CharlWilfred Lacy

## 2022-10-14 NOTE — Assessment & Plan Note (Signed)
She wants to wean off fluoxetine '40mg'$ . States mood has been stable for last 84month. She has already decreased dose to '20mg'$  daily for last 1week. She denies any adverse side effects. She wants to wean medication over the next 2-32month  Provided weaning instructions: '20mg'$  daily x 79m53monthhen '10mg'$  daily x 79mo56monthen '10mg'$  every other day x 1week, then stop. F/up in 3mon24month

## 2022-10-31 ENCOUNTER — Encounter: Payer: Self-pay | Admitting: Nurse Practitioner

## 2022-11-07 ENCOUNTER — Other Ambulatory Visit: Payer: Self-pay | Admitting: Nurse Practitioner

## 2022-11-07 DIAGNOSIS — F411 Generalized anxiety disorder: Secondary | ICD-10-CM

## 2022-11-07 NOTE — Telephone Encounter (Signed)
Chart supports Rx Last OV: 09/2022 Next OV: not scheduled

## 2022-11-15 ENCOUNTER — Telehealth: Payer: Medicare Other | Admitting: Physician Assistant

## 2022-11-15 ENCOUNTER — Encounter: Payer: Self-pay | Admitting: Nurse Practitioner

## 2022-11-15 DIAGNOSIS — L304 Erythema intertrigo: Secondary | ICD-10-CM

## 2022-11-15 MED ORDER — CLOTRIMAZOLE 1 % EX CREA: 1.0000 | TOPICAL_CREAM | Freq: Two times a day (BID) | CUTANEOUS | 0 refills | Status: DC

## 2022-11-15 NOTE — Progress Notes (Signed)
E Visit for Rash  We are sorry that you are not feeling well. Here is how we plan to help!  Based upon your presentation it appears you have a fungal infection.  I have prescribed:Clotrimazole 1% cream Apply to affected areas twice daily as needed.   HOME CARE:  Take cool showers and avoid direct sunlight. Apply cool compress or wet dressings. Take a bath in an oatmeal bath.  Sprinkle content of one Aveeno packet under running faucet with comfortably warm water.  Bathe for 15-20 minutes, 1-2 times daily.  Pat dry with a towel. Do not rub the rash. Use hydrocortisone cream. Take an antihistamine like Benadryl for widespread rashes that itch.  The adult dose of Benadryl is 25-50 mg by mouth 4 times daily. Caution:  This type of medication may cause sleepiness.  Do not drink alcohol, drive, or operate dangerous machinery while taking antihistamines.  Do not take these medications if you have prostate enlargement.  Read package instructions thoroughly on all medications that you take.  GET HELP RIGHT AWAY IF:  Symptoms don't go away after treatment. Severe itching that persists. If you rash spreads or swells. If you rash begins to smell. If it blisters and opens or develops a yellow-brown crust. You develop a fever. You have a sore throat. You become short of breath.  MAKE SURE YOU:  Understand these instructions. Will watch your condition. Will get help right away if you are not doing well or get worse.  Thank you for choosing an e-visit.  Your e-visit answers were reviewed by a board certified advanced clinical practitioner to complete your personal care plan. Depending upon the condition, your plan could have included both over the counter or prescription medications.  Please review your pharmacy choice. Make sure the pharmacy is open so you can pick up prescription now. If there is a problem, you may contact your provider through [employer] and have the prescription routed  to another pharmacy.  Your safety is important to [place]. If you have drug allergies check your prescription carefully.   For the next 24 hours you can use MyChart to ask questions about today's visit, request a non-urgent call back, or ask for a work or school excuse. You will get an email in the next two days asking about your experience. I hope that your e-visit has been valuable and will speed your recovery.  I have spent 5 minutes in review of e-visit questionnaire, review and updating patient chart, medical decision making and response to patient.   Mar Daring, PA-C

## 2022-12-01 ENCOUNTER — Other Ambulatory Visit: Payer: Self-pay | Admitting: Nurse Practitioner

## 2022-12-01 DIAGNOSIS — F411 Generalized anxiety disorder: Secondary | ICD-10-CM

## 2022-12-02 ENCOUNTER — Other Ambulatory Visit: Payer: Self-pay | Admitting: Nurse Practitioner

## 2022-12-02 DIAGNOSIS — L304 Erythema intertrigo: Secondary | ICD-10-CM

## 2022-12-02 MED ORDER — CLOTRIMAZOLE 1 % EX CREA: 1.0000 | TOPICAL_CREAM | Freq: Two times a day (BID) | CUTANEOUS | 0 refills | Status: DC

## 2023-01-08 ENCOUNTER — Other Ambulatory Visit: Payer: Self-pay | Admitting: Nurse Practitioner

## 2023-01-08 DIAGNOSIS — F411 Generalized anxiety disorder: Secondary | ICD-10-CM

## 2023-01-08 NOTE — Telephone Encounter (Signed)
Chart supports Rx Last OV: 09/2022 Next OV: not scheduled

## 2023-01-16 ENCOUNTER — Telehealth: Payer: Self-pay | Admitting: Nurse Practitioner

## 2023-01-16 NOTE — Telephone Encounter (Signed)
Tried calling patient to schedule Medicare Annual Wellness Visit (AWV) either virtually or in office.  my jabber number [phone]  No answer    Last AWV 02/05/22 please schedule with Nurse Health Adviser   45 min for awv-i  in office appointments 30 min for awv-s & awv-i phone/virtual appointments

## 2023-01-20 ENCOUNTER — Other Ambulatory Visit: Payer: Medicare Other

## 2023-02-13 ENCOUNTER — Other Ambulatory Visit: Payer: Self-pay | Admitting: Nurse Practitioner

## 2023-02-13 DIAGNOSIS — L304 Erythema intertrigo: Secondary | ICD-10-CM

## 2023-02-20 ENCOUNTER — Telehealth: Payer: Self-pay | Admitting: Nurse Practitioner

## 2023-02-20 NOTE — Telephone Encounter (Signed)
Called patient to schedule Medicare Annual Wellness Visit (AWV). No voicemail available to leave a message.  Last date of AWV: 02/05/22  Please schedule an appointment at any time with [facility] .  If any questions, please contact me at [phone].  Thank you ,  Barkley Boards AWV direct phone # [phone]

## 2023-04-21 ENCOUNTER — Telehealth: Payer: Self-pay | Admitting: Nurse Practitioner

## 2023-04-21 NOTE — Telephone Encounter (Signed)
Contacted Betty Holmes to schedule their annual wellness visit. Appointment made for 04/25/23.  Rudell Cobb AWV direct phone # [phone]

## 2023-04-21 NOTE — Telephone Encounter (Signed)
Called patient to schedule Medicare Annual Wellness Visit (AWV). Left message for patient to call back and schedule Medicare Annual Wellness Visit (AWV).  Last date of AWV: 02/05/22  Please schedule an appointment at any time with [facility].  If any questions, please contact me at [phone].  Thank you ,  Rudell Cobb AWV direct phone # [phone]

## 2023-04-25 ENCOUNTER — Ambulatory Visit (INDEPENDENT_AMBULATORY_CARE_PROVIDER_SITE_OTHER): Payer: 59

## 2023-04-25 VITALS — Ht 63.0 in | Wt 201.0 lb

## 2023-04-25 DIAGNOSIS — Z0000 Encounter for general adult medical examination without abnormal findings: Secondary | ICD-10-CM | POA: Diagnosis not present

## 2023-04-25 NOTE — Progress Notes (Signed)
I connected with  Betty Holmes on 04/25/23 by a audio enabled telemedicine application and verified that I am speaking with the correct person using two identifiers.  Patient Location: Home  Provider Location: Office/Clinic  I discussed the limitations of evaluation and management by telemedicine. The patient expressed understanding and agreed to proceed.  Subjective:   Betty Holmes is a 67 y.o. female who presents for Medicare Annual (Subsequent) preventive examination.  Review of Systems     Cardiac Risk Factors include: advanced age (>24men, >23 women);obesity (BMI >30kg/m2)     Objective:    Today's Vitals   04/25/23 1011 04/25/23 1012  Weight: 201 lb (91.2 kg)   Height: 5\' 3"  (1.6 m)   PainSc:  7    Body mass index is 35.61 kg/m.     04/25/2023   10:16 AM 02/05/2022    8:37 AM 08/02/2020    3:05 PM 06/22/2020    9:31 AM 12/02/2019    9:59 AM 07/09/2017   11:15 AM 06/05/2017   10:17 AM  Advanced Directives  Does Patient Have a Medical Advance Directive? No No No No No No No  Would patient like information on creating a medical advance directive?  No - Patient declined Yes (MAU/Ambulatory/Procedural Areas - Information given) No - Patient declined No - Patient declined      Current Medications (verified) Outpatient Encounter Medications as of 04/25/2023  Medication Sig   clotrimazole (LOTRIMIN) 1 % cream APPLY TO AFFECTED AREA TWICE A DAY   hydrOXYzine (ATARAX) 25 MG tablet TAKE 1 TABLET (25 MG TOTAL) BY MOUTH DAILY AS NEEDED FOR ANXIETY OR ITCHING.   FLUoxetine (PROZAC) 10 MG capsule TAKE 1 CAPSULE BY MOUTH EVERY DAY (Patient not taking: Reported on 04/25/2023)   No facility-administered encounter medications on file as of 04/25/2023.    Allergies (verified) Penicillins and Mineral oil   History: Past Medical History:  Diagnosis Date   Anxiety    Arthritis    At high risk for tick borne illness    had abx--test her again--she was okey---at [employer]  [employer]   Depression    Fibromyalgia    Fibromyalgia    Past Surgical History:  Procedure Laterality Date   cataracts removal (bilateral) Bilateral    CESAREAN SECTION     CYST EXCISION Right 05/09/2017   Procedure: EXCISION RIGHT INGUINAL CYST;  Surgeon: Berna Bue, MD;  Location: [facility];  Service: General;  Laterality: Right;   JOINT REPLACEMENT     KNEE ARTHROSCOPY     KNEE SURGERY     OVARIAN CYST REMOVAL     Family History  Problem Relation Age of Onset   Alzheimer's disease Mother    Heart disease Father    Alzheimer's disease Maternal Aunt    Alzheimer's disease Maternal Uncle    Alzheimer's disease Maternal Grandmother    Social History   Socioeconomic History   Marital status: Married    Spouse name: Not on file   Number of children: Not on file   Years of education: Not on file   Highest education level: Not on file  Occupational History   Occupation: Retired  Tobacco Use   Smoking status: Never   Smokeless tobacco: Never  Vaping Use   Vaping Use: Never used  Substance and Sexual Activity   Alcohol use: No   Drug use: No   Sexual activity: Not Currently    Birth control/protection: Post-menopausal  Other Topics Concern  Not on file  Social History Narrative   Not on file   Social Determinants of Health   Financial Resource Strain: Low Risk  (04/25/2023)   Overall Financial Resource Strain (CARDIA)    Difficulty of Paying Living Expenses: Not hard at all  Food Insecurity: No Food Insecurity (04/25/2023)   Hunger Vital Sign    Worried About Running Out of Food in the Last Year: Never true    Ran Out of Food in the Last Year: Never true  Transportation Needs: No Transportation Needs (04/25/2023)   PRAPARE - Administrator, Civil Service (Medical): No    Lack of Transportation (Non-Medical): No  Physical Activity: Insufficiently Active (04/25/2023)   Exercise Vital Sign    Days of Exercise per Week: 2 days     Minutes of Exercise per Session: 30 min  Stress: No Stress Concern Present (04/25/2023)   [employer] of Occupational Health - Occupational Stress Questionnaire    Feeling of Stress : Not at all  Social Connections: Moderately Integrated (02/05/2022)   Social Connection and Isolation Panel [NHANES]    Frequency of Communication with Friends and Family: More than three times a week    Frequency of Social Gatherings with Friends and Family: Twice a week    Attends Religious Services: More than 4 times per year    Active Member of [employer] or Organizations: No    Attends Engineer, structural: Never    Marital Status: Married    Tobacco Counseling Counseling given: Not Answered   Clinical Intake:  Pre-visit preparation completed: Yes  Pain : 0-10 Pain Score: 7  Pain Type: Chronic pain Pain Location: Foot Pain Orientation: Left, Right Pain Descriptors / Indicators: Aching Pain Onset: More than a month ago Pain Frequency: Constant     Nutritional Status: BMI > 30  Obese Nutritional Risks: None Diabetes: No  How often do you need to have someone help you when you read instructions, pamphlets, or other written materials from your doctor or pharmacy?: 1 - Never  Diabetic? no  Interpreter Needed?: No  Information entered by :: NAllen LPN   Activities of Daily Living    04/25/2023   10:18 AM  In your present state of health, do you have any difficulty performing the following activities:  Hearing? 1  Comment has hearing aids  Vision? 0  Difficulty concentrating or making decisions? 0  Walking or climbing stairs? 1  Dressing or bathing? 0  Doing errands, shopping? 0  Preparing Food and eating ? N  Using the Toilet? N  In the past six months, have you accidently leaked urine? Y  Do you have problems with loss of bowel control? N  Managing your Medications? N  Managing your Finances? N  Housekeeping or managing your Housekeeping? N    Patient Care  Team: Nche, Bonna Gains, NP as PCP - General (Internal Medicine)  Indicate any recent Medical Services you may have received from other than [facility] providers in the past year (date may be approximate).     Assessment:   This is a routine wellness examination for Betty Holmes.  Hearing/Vision screen Vision Screening - Comments:: Regular eye exams, [facility]  Dietary issues and exercise activities discussed: Current Exercise Habits: Home exercise routine, Type of exercise: Other - see comments (swimming), Time (Minutes): 30, Frequency (Times/Week): 2, Weekly Exercise (Minutes/Week): 60   Goals Addressed             This Visit's Progress  Patient Stated       04/25/2023, wants to lose weight       Depression Screen    04/25/2023   10:17 AM 10/14/2022   11:45 AM 07/02/2022    1:33 PM 02/05/2022    8:38 AM 08/22/2021    8:58 AM 03/30/2021    9:08 AM 08/02/2020    3:07 PM  PHQ 2/9 Scores  PHQ - 2 Score 1 0 5 3 4 2  0  PHQ- 9 Score  3 19 13 13 10      Fall Risk    04/25/2023   10:16 AM 02/05/2022    8:31 AM 08/22/2021    8:54 AM 08/02/2020    3:06 PM 05/11/2020    2:12 PM  Fall Risk   Falls in the past year? 0 0 0 0 0  Number falls in past yr: 0 0 0 0 0  Injury with Fall? 0 0 0 0 0  Risk for fall due to : Medication side effect  No Fall Risks    Follow up Falls prevention discussed;Education provided;Falls evaluation completed Falls evaluation completed;Falls prevention discussed Falls evaluation completed Falls prevention discussed     FALL RISK PREVENTION PERTAINING TO THE HOME:  Any stairs in or around the home? Yes  If so, are there any without handrails? No  Home free of loose throw rugs in walkways, pet beds, electrical cords, etc? Yes  Adequate lighting in your home to reduce risk of falls? Yes   ASSISTIVE DEVICES UTILIZED TO PREVENT FALLS:  Life alert? No  Use of a cane, walker or w/c? No  Grab bars in the bathroom? No  Shower chair or bench in shower? No   Elevated toilet seat or a handicapped toilet? No   TIMED UP AND GO:  Was the test performed? No .      Cognitive Function:        04/25/2023   10:20 AM  6CIT Screen  What Year? 0 points  What month? 0 points  What time? 0 points  Count back from 20 0 points  Months in reverse 0 points  Repeat phrase 0 points  Total Score 0 points    Immunizations Immunization History  Administered Date(s) Administered   PFIZER(Purple Top)SARS-COV-2 Vaccination 04/27/2020, 05/18/2020, 11/02/2020    TDAP status: Up to date  Flu Vaccine status: Declined, Education has been provided regarding the importance of this vaccine but patient still declined. Advised may receive this vaccine at local pharmacy or Health Dept. Aware to provide a copy of the vaccination record if obtained from local pharmacy or Health Dept. Verbalized acceptance and understanding.  Pneumococcal vaccine status: Declined,  Education has been provided regarding the importance of this vaccine but patient still declined. Advised may receive this vaccine at local pharmacy or Health Dept. Aware to provide a copy of the vaccination record if obtained from local pharmacy or Health Dept. Verbalized acceptance and understanding.   Covid-19 vaccine status: Completed vaccines  Qualifies for Shingles Vaccine? Yes   Zostavax completed No   Shingrix Completed?: No.    Education has been provided regarding the importance of this vaccine. Patient has been advised to call insurance company to determine out of pocket expense if they have not yet received this vaccine. Advised may also receive vaccine at local pharmacy or Health Dept. Verbalized acceptance and understanding.  Screening Tests Health Maintenance  Topic Date Due   DTaP/Tdap/Td (1 - Tdap) Never done   Zoster Vaccines- Shingrix (1 of  2) Never done   Pneumonia Vaccine 88+ Years old (1 of 1 - PCV) Never done   COVID-19 Vaccine (4 - 2023-24 season) 08/23/2022   MAMMOGRAM   09/27/2022   Medicare Annual Wellness (AWV)  02/05/2023   INFLUENZA VACCINE  07/24/2023   COLON CANCER SCREENING ANNUAL FOBT  08/29/2023   Fecal DNA (Cologuard)  08/28/2025   DEXA SCAN  Completed   Hepatitis C Screening  Completed   HPV VACCINES  Aged Out   COLONOSCOPY (Pts 45-110yrs Insurance coverage will need to be confirmed)  Discontinued    Health Maintenance  Health Maintenance Due  Topic Date Due   DTaP/Tdap/Td (1 - Tdap) Never done   Zoster Vaccines- Shingrix (1 of 2) Never done   Pneumonia Vaccine 33+ Years old (1 of 1 - PCV) Never done   COVID-19 Vaccine (4 - 2023-24 season) 08/23/2022   MAMMOGRAM  09/27/2022   Medicare Annual Wellness (AWV)  02/05/2023    Colorectal cancer screening: Type of screening: Cologuard. Completed 08/28/2022. Repeat every 3 years  Mammogram status: Completed 09/27/2020. Repeat every year  Bone Density status: Ordered 07/02/2022. Pt provided with contact info and advised to call to schedule appt.  Lung Cancer Screening: (Low Dose CT Chest recommended if Age 2-80 years, 30 pack-year currently smoking OR have quit w/in 15years.) does not qualify.   Lung Cancer Screening Referral: no  Additional Screening:  Hepatitis C Screening: does qualify; Completed 05/01/2017  Vision Screening: Recommended annual ophthalmology exams for early detection of glaucoma and other disorders of the eye. Is the patient up to date with their annual eye exam?  Yes  Who is the provider or what is the name of the office in which the patient attends annual eye exams? [facility] If pt is not established with a provider, would they like to be referred to a provider to establish care? No .   Dental Screening: Recommended annual dental exams for proper oral hygiene  Community Resource Referral / Chronic Care Management: CRR required this visit?  No   CCM required this visit?  No      Plan:     I have personally reviewed and noted the following in the patient's  chart:   Medical and social history Use of alcohol, tobacco or illicit drugs  Current medications and supplements including opioid prescriptions. Patient is not currently taking opioid prescriptions. Functional ability and status Nutritional status Physical activity Advanced directives List of other physicians Hospitalizations, surgeries, and ER visits in previous 12 months Vitals Screenings to include cognitive, depression, and falls Referrals and appointments  In addition, I have reviewed and discussed with patient certain preventive protocols, quality metrics, and best practice recommendations. A written personalized care plan for preventive services as well as general preventive health recommendations were provided to patient.     Barb Merino, LPN   07/24/9561   Nurse Notes: none  Due to this being a virtual visit, the after visit summary with patients personalized plan was offered to patient via mail or my-chart. Patient would like to access on my-chart

## 2023-04-25 NOTE — Patient Instructions (Signed)
Betty Holmes , Thank you for taking time to come for your Medicare Wellness Visit. I appreciate your ongoing commitment to your health goals. Please review the following plan we discussed and let me know if I can assist you in the future.   These are the goals we discussed:  Goals      Patient Stated     Maintain current health     Patient Stated     04/25/2023, wants to lose weight     Weight (lb) < 200 lb (90.7 kg)        This is a list of the screening recommended for you and due dates:  Health Maintenance  Topic Date Due   DTaP/Tdap/Td vaccine (1 - Tdap) Never done   Zoster (Shingles) Vaccine (1 of 2) Never done   Pneumonia Vaccine (1 of 1 - PCV) Never done   COVID-19 Vaccine (4 - 2023-24 season) 08/23/2022   Mammogram  09/27/2022   Flu Shot  07/24/2023   Stool Blood Test  08/29/2023   Medicare Annual Wellness Visit  04/24/2024   Cologuard (Stool DNA test)  08/28/2025   DEXA scan (bone density measurement)  Completed   Hepatitis C Screening: USPSTF Recommendation to screen - Ages 39-79 yo.  Completed   HPV Vaccine  Aged Out   Colon Cancer Screening  Discontinued    Advanced directives: Advance directive discussed with you today.   Conditions/risks identified: none  Next appointment: Follow up in one year for your annual wellness visit    Preventive Care 65 Years and Older, Female Preventive care refers to lifestyle choices and visits with your health care provider that can promote health and wellness. What does preventive care include? A yearly physical exam. This is also called an annual well check. Dental exams once or twice a year. Routine eye exams. Ask your health care provider how often you should have your eyes checked. Personal lifestyle choices, including: Daily care of your teeth and gums. Regular physical activity. Eating a healthy diet. Avoiding tobacco and drug use. Limiting alcohol use. Practicing safe sex. Taking low-dose aspirin every  day. Taking vitamin and mineral supplements as recommended by your health care provider. What happens during an annual well check? The services and screenings done by your health care provider during your annual well check will depend on your age, overall health, lifestyle risk factors, and family history of disease. Counseling  Your health care provider may ask you questions about your: Alcohol use. Tobacco use. Drug use. Emotional well-being. Home and relationship well-being. Sexual activity. Eating habits. History of falls. Memory and ability to understand (cognition). Work and work Astronomer. Reproductive health. Screening  You may have the following tests or measurements: Height, weight, and BMI. Blood pressure. Lipid and cholesterol levels. These may be checked every 5 years, or more frequently if you are over 65 years old. Skin check. Lung cancer screening. You may have this screening every year starting at age 33 if you have a 30-pack-year history of smoking and currently smoke or have quit within the past 15 years. Fecal occult blood test (FOBT) of the stool. You may have this test every year starting at age 59. Flexible sigmoidoscopy or colonoscopy. You may have a sigmoidoscopy every 5 years or a colonoscopy every 10 years starting at age 70. Hepatitis C blood test. Hepatitis B blood test. Sexually transmitted disease (STD) testing. Diabetes screening. This is done by checking your blood sugar (glucose) after you have not eaten for a  while (fasting). You may have this done every 1-3 years. Bone density scan. This is done to screen for osteoporosis. You may have this done starting at age 53. Mammogram. This may be done every 1-2 years. Talk to your health care provider about how often you should have regular mammograms. Talk with your health care provider about your test results, treatment options, and if necessary, the need for more tests. Vaccines  Your health care  provider may recommend certain vaccines, such as: Influenza vaccine. This is recommended every year. Tetanus, diphtheria, and acellular pertussis (Tdap, Td) vaccine. You may need a Td booster every 10 years. Zoster vaccine. You may need this after age 38. Pneumococcal 13-valent conjugate (PCV13) vaccine. One dose is recommended after age 81. Pneumococcal polysaccharide (PPSV23) vaccine. One dose is recommended after age 2. Talk to your health care provider about which screenings and vaccines you need and how often you need them. This information is not intended to replace advice given to you by your health care provider. Make sure you discuss any questions you have with your health care provider. Document Released: 01/05/2016 Document Revised: 08/28/2016 Document Reviewed: 10/10/2015 Elsevier Interactive Patient Education  2017 [place] Prevention in the Home Falls can cause injuries. They can happen to people of all ages. There are many things you can do to make your home safe and to help prevent falls. What can I do on the outside of my home? Regularly fix the edges of walkways and driveways and fix any cracks. Remove anything that might make you trip as you walk through a door, such as a raised step or threshold. Trim any bushes or trees on the path to your home. Use bright outdoor lighting. Clear any walking paths of anything that might make someone trip, such as rocks or tools. Regularly check to see if handrails are loose or broken. Make sure that both sides of any steps have handrails. Any raised decks and porches should have guardrails on the edges. Have any leaves, snow, or ice cleared regularly. Use sand or salt on walking paths during winter. Clean up any spills in your garage right away. This includes oil or grease spills. What can I do in the bathroom? Use night lights. Install grab bars by the toilet and in the tub and shower. Do not use towel bars as grab  bars. Use non-skid mats or decals in the tub or shower. If you need to sit down in the shower, use a plastic, non-slip stool. Keep the floor dry. Clean up any water that spills on the floor as soon as it happens. Remove soap buildup in the tub or shower regularly. Attach bath mats securely with double-sided non-slip rug tape. Do not have throw rugs and other things on the floor that can make you trip. What can I do in the bedroom? Use night lights. Make sure that you have a light by your bed that is easy to reach. Do not use any sheets or blankets that are too big for your bed. They should not hang down onto the floor. Have a firm chair that has side arms. You can use this for support while you get dressed. Do not have throw rugs and other things on the floor that can make you trip. What can I do in the kitchen? Clean up any spills right away. Avoid walking on wet floors. Keep items that you use a lot in easy-to-reach places. If you need to reach something above you,  use a strong step stool that has a grab bar. Keep electrical cords out of the way. Do not use floor polish or wax that makes floors slippery. If you must use wax, use non-skid floor wax. Do not have throw rugs and other things on the floor that can make you trip. What can I do with my stairs? Do not leave any items on the stairs. Make sure that there are handrails on both sides of the stairs and use them. Fix handrails that are broken or loose. Make sure that handrails are as long as the stairways. Check any carpeting to make sure that it is firmly attached to the stairs. Fix any carpet that is loose or worn. Avoid having throw rugs at the top or bottom of the stairs. If you do have throw rugs, attach them to the floor with carpet tape. Make sure that you have a light switch at the top of the stairs and the bottom of the stairs. If you do not have them, ask someone to add them for you. What else can I do to help prevent  falls? Wear shoes that: Do not have high heels. Have rubber bottoms. Are comfortable and fit you well. Are closed at the toe. Do not wear sandals. If you use a stepladder: Make sure that it is fully opened. Do not climb a closed stepladder. Make sure that both sides of the stepladder are locked into place. Ask someone to hold it for you, if possible. Clearly mark and make sure that you can see: Any grab bars or handrails. First and last steps. Where the edge of each step is. Use tools that help you move around (mobility aids) if they are needed. These include: Canes. Walkers. Scooters. Crutches. Turn on the lights when you go into a dark area. Replace any light bulbs as soon as they burn out. Set up your furniture so you have a clear path. Avoid moving your furniture around. If any of your floors are uneven, fix them. If there are any pets around you, be aware of where they are. Review your medicines with your doctor. Some medicines can make you feel dizzy. This can increase your chance of falling. Ask your doctor what other things that you can do to help prevent falls. This information is not intended to replace advice given to you by your health care provider. Make sure you discuss any questions you have with your health care provider. Document Released: 10/05/2009 Document Revised: 05/16/2016 Document Reviewed: 01/13/2015 Elsevier Interactive Patient Education  2017 [employer].

## 2023-05-29 DIAGNOSIS — D485 Neoplasm of uncertain behavior of skin: Secondary | ICD-10-CM | POA: Diagnosis not present

## 2023-05-29 DIAGNOSIS — L814 Other melanin hyperpigmentation: Secondary | ICD-10-CM | POA: Diagnosis not present

## 2023-06-18 ENCOUNTER — Telehealth: Payer: Self-pay

## 2023-06-18 NOTE — Telephone Encounter (Signed)
Lmtrc to have Mammgram scheduled

## 2023-07-02 ENCOUNTER — Other Ambulatory Visit: Payer: Self-pay | Admitting: Nurse Practitioner

## 2023-07-02 DIAGNOSIS — F411 Generalized anxiety disorder: Secondary | ICD-10-CM

## 2023-07-03 DIAGNOSIS — Z961 Presence of intraocular lens: Secondary | ICD-10-CM | POA: Diagnosis not present

## 2023-07-03 DIAGNOSIS — H04123 Dry eye syndrome of bilateral lacrimal glands: Secondary | ICD-10-CM | POA: Diagnosis not present

## 2023-07-03 DIAGNOSIS — H26492 Other secondary cataract, left eye: Secondary | ICD-10-CM | POA: Diagnosis not present

## 2023-07-03 DIAGNOSIS — H40013 Open angle with borderline findings, low risk, bilateral: Secondary | ICD-10-CM | POA: Diagnosis not present

## 2023-07-15 ENCOUNTER — Other Ambulatory Visit (HOSPITAL_COMMUNITY)
Admission: RE | Admit: 2023-07-15 | Discharge: 2023-07-15 | Disposition: A | Discharge status: Home / Self Care | Payer: 59 | Source: Ambulatory Visit | Attending: Nurse Practitioner | Admitting: Nurse Practitioner

## 2023-07-15 ENCOUNTER — Ambulatory Visit: Payer: 59 | Admitting: Nurse Practitioner

## 2023-07-15 ENCOUNTER — Encounter: Payer: Self-pay | Admitting: Nurse Practitioner

## 2023-07-15 VITALS — BP 100/74 | HR 73 | Temp 98.6°F | Ht 63.0 in | Wt 192.6 lb

## 2023-07-15 DIAGNOSIS — B372 Candidiasis of skin and nail: Secondary | ICD-10-CM | POA: Diagnosis not present

## 2023-07-15 DIAGNOSIS — F411 Generalized anxiety disorder: Secondary | ICD-10-CM | POA: Diagnosis not present

## 2023-07-15 DIAGNOSIS — M85852 Other specified disorders of bone density and structure, left thigh: Secondary | ICD-10-CM

## 2023-07-15 DIAGNOSIS — M85851 Other specified disorders of bone density and structure, right thigh: Secondary | ICD-10-CM | POA: Diagnosis not present

## 2023-07-15 DIAGNOSIS — N898 Other specified noninflammatory disorders of vagina: Secondary | ICD-10-CM

## 2023-07-15 DIAGNOSIS — F3341 Major depressive disorder, recurrent, in partial remission: Secondary | ICD-10-CM

## 2023-07-15 DIAGNOSIS — N3946 Mixed incontinence: Secondary | ICD-10-CM | POA: Insufficient documentation

## 2023-07-15 DIAGNOSIS — L304 Erythema intertrigo: Secondary | ICD-10-CM | POA: Diagnosis not present

## 2023-07-15 DIAGNOSIS — Z1231 Encounter for screening mammogram for malignant neoplasm of breast: Secondary | ICD-10-CM

## 2023-07-15 MED ORDER — FLUCONAZOLE 150 MG PO TABS: 150.0000 mg | ORAL_TABLET | Freq: Every day | ORAL | 0 refills | Status: DC

## 2023-07-15 MED ORDER — CLOTRIMAZOLE 1 % EX CREA: TOPICAL_CREAM | Freq: Two times a day (BID) | CUTANEOUS | 5 refills | Status: DC

## 2023-07-15 NOTE — Progress Notes (Signed)
Established Patient Visit  Patient: Betty Holmes   DOB: Nov 09, 1956   67 y.o. Female  MRN: [id number] Visit Date: 07/15/2023  Subjective:    Chief Complaint  Patient presents with   Vaginal Bleeding    Pt states she took out her pessary x  days ago and there was blood on it. Pt is concern.    fungus of skin    Pt concerns with yeast under breast, arm pits and between thighs.    HPI Candidal intertrigo Chronic, waxing and waning, no improvement with nystatin and miconazole. Significant improvement with miconazole cream. Refill sent  GAD (generalized anxiety disorder) Prozac discontinued by patient 7months Current use of vistaril 25mg  at hs Reports mood is stable Med refill sent  Osteopenia Agreed to repeat dexa scan  Recurrent major depressive disorder, in partial remission (HCC) Denies need for prozac. She discontinued me 7months ago  Urinary incontinence, mixed Secondary to pelvic weakness and bladder prolapse. Use of pessary for several years. Noticed vaginal irritation with removal of pessary. Entered referral to Urogyn  Reviewed medical, surgical, and social history today  Medications: Outpatient Medications Prior to Visit  Medication Sig   hydrOXYzine (ATARAX) 25 MG tablet TAKE 1 TABLET (25 MG TOTAL) BY MOUTH DAILY AS NEEDED FOR ANXIETY OR ITCHING.   [DISCONTINUED] clotrimazole (LOTRIMIN) 1 % cream APPLY TO AFFECTED AREA TWICE A DAY   [DISCONTINUED] FLUoxetine (PROZAC) 10 MG capsule TAKE 1 CAPSULE BY MOUTH EVERY DAY (Patient not taking: Reported on 07/15/2023)   No facility-administered medications prior to visit.   Reviewed past medical and social history.   ROS per HPI above      Objective:  BP 100/74   Pulse 73   Temp 98.6 F (37 C)   Ht 5\' 3"  (1.6 m)   Wt 192 lb 9.6 oz (87.4 kg)   SpO2 99%   BMI 34.12 kg/m      Physical Exam Vitals and nursing note reviewed. Exam conducted with a chaperone present.  Constitutional:       Appearance: She is obese.  Cardiovascular:     Rate and Rhythm: Normal rate and regular rhythm.     Pulses: Normal pulses.     Heart sounds: Normal heart sounds.  Pulmonary:     Effort: Pulmonary effort is normal.     Breath sounds: Normal breath sounds.  Genitourinary:    General: Normal vulva.     Labia:        Right: No rash, tenderness or lesion.        Left: No rash, tenderness or lesion.      Vagina: Vaginal discharge and erythema present. No bleeding.     Cervix: Discharge and erythema present. No cervical motion tenderness, friability, lesion or cervical bleeding.     Adnexa: Right adnexa normal and left adnexa normal.  Lymphadenopathy:     Lower Body: No right inguinal adenopathy. No left inguinal adenopathy.  Skin:    Findings: No erythema.       Neurological:     Mental Status: She is alert and oriented to person, place, and time.  Psychiatric:        Mood and Affect: Mood normal.        Behavior: Behavior normal.        Thought Content: Thought content normal.     No results found for any visits on 07/15/23.    Assessment &  Plan:    Problem List Items Addressed This Visit       Musculoskeletal and Integument   Candidal intertrigo - Primary    Chronic, waxing and waning, no improvement with nystatin and miconazole. Significant improvement with miconazole cream. Refill sent      Relevant Medications   clotrimazole (LOTRIMIN) 1 % cream   fluconazole (DIFLUCAN) 150 MG tablet   Osteopenia    Agreed to repeat dexa scan      Relevant Orders   DG Bone Density     Other   GAD (generalized anxiety disorder)    Prozac discontinued by patient 7months Current use of vistaril 25mg  at hs Reports mood is stable Med refill sent      Recurrent major depressive disorder, in partial remission (HCC)    Denies need for prozac. She discontinued me 7months ago      Urinary incontinence, mixed    Secondary to pelvic weakness and bladder prolapse. Use of pessary  for several years. Noticed vaginal irritation with removal of pessary. Entered referral to Urogyn      Relevant Orders   Ambulatory referral to Urogynecology   Other Visit Diagnoses     Intertrigo       Relevant Medications   clotrimazole (LOTRIMIN) 1 % cream   Breast cancer screening by mammogram       Relevant Orders   MM 3D SCREENING MAMMOGRAM BILATERAL BREAST   Vaginal itching       Relevant Medications   fluconazole (DIFLUCAN) 150 MG tablet   Other Relevant Orders   Cervicovaginal ancillary only( )      Return in about 6 months (around 01/15/2024) for CPE (fasting).     Alysia Penna, NP

## 2023-07-15 NOTE — Assessment & Plan Note (Signed)
Secondary to pelvic weakness and bladder prolapse. Use of pessary for several years. Noticed vaginal irritation with removal of pessary. Entered referral to Urogyn

## 2023-07-15 NOTE — Assessment & Plan Note (Signed)
Prozac discontinued by patient 7months Current use of vistaril 25mg  at hs Reports mood is stable Med refill sent

## 2023-07-15 NOTE — Assessment & Plan Note (Signed)
Denies need for prozac. She discontinued me 7months ago

## 2023-07-15 NOTE — Assessment & Plan Note (Signed)
Chronic, waxing and waning, no improvement with nystatin and miconazole. Significant improvement with miconazole cream. Refill sent

## 2023-07-15 NOTE — Assessment & Plan Note (Signed)
Agreed to repeat dexa scan

## 2023-07-16 ENCOUNTER — Ambulatory Visit: Payer: 59 | Admitting: Nurse Practitioner

## 2023-07-16 LAB — CERVICOVAGINAL ANCILLARY ONLY
Bacterial Vaginitis (gardnerella): NEGATIVE
Candida Glabrata: NEGATIVE
Candida Vaginitis: NEGATIVE
Chlamydia: NEGATIVE
Comment: NEGATIVE
Comment: NEGATIVE
Comment: NEGATIVE
Comment: NEGATIVE
Comment: NEGATIVE
Comment: NORMAL
Neisseria Gonorrhea: NEGATIVE
Trichomonas: NEGATIVE

## 2023-07-17 ENCOUNTER — Encounter: Payer: Self-pay | Admitting: Nurse Practitioner

## 2023-07-21 ENCOUNTER — Encounter: Payer: Self-pay | Admitting: Nurse Practitioner

## 2023-07-23 ENCOUNTER — Encounter: Payer: Self-pay | Admitting: Nurse Practitioner

## 2023-07-24 ENCOUNTER — Ambulatory Visit: Payer: 59

## 2023-07-24 ENCOUNTER — Ambulatory Visit (INDEPENDENT_AMBULATORY_CARE_PROVIDER_SITE_OTHER): Payer: 59 | Admitting: Nurse Practitioner

## 2023-07-24 ENCOUNTER — Encounter: Payer: Self-pay | Admitting: Nurse Practitioner

## 2023-07-24 VITALS — BP 110/70 | HR 72 | Temp 98.0°F | Resp 16 | Ht 63.0 in | Wt 188.0 lb

## 2023-07-24 DIAGNOSIS — E559 Vitamin D deficiency, unspecified: Secondary | ICD-10-CM

## 2023-07-24 DIAGNOSIS — H8112 Benign paroxysmal vertigo, left ear: Secondary | ICD-10-CM

## 2023-07-24 LAB — CBC
HCT: 44.5 % (ref 36.0–46.0)
Hemoglobin: 14.5 g/dL (ref 12.0–15.0)
MCHC: 32.6 g/dL (ref 30.0–36.0)
MCV: 88.4 fl (ref 78.0–100.0)
Platelets: 205 10*3/uL (ref 150.0–400.0)
RBC: 5.04 Mil/uL (ref 3.87–5.11)
RDW: 14 % (ref 11.5–15.5)
WBC: 4.2 10*3/uL (ref 4.0–10.5)

## 2023-07-24 LAB — COMPREHENSIVE METABOLIC PANEL
ALT: 16 U/L (ref 0–35)
AST: 19 U/L (ref 0–37)
Albumin: 4.3 g/dL (ref 3.5–5.2)
Alkaline Phosphatase: 67 U/L (ref 39–117)
BUN: 14 mg/dL (ref 6–23)
CO2: 25 mEq/L (ref 19–32)
Calcium: 9.7 mg/dL (ref 8.4–10.5)
Chloride: 104 mEq/L (ref 96–112)
Creatinine, Ser: 0.64 mg/dL (ref 0.40–1.20)
GFR: 90.57 mL/min (ref >60.00)
Glucose, Bld: 101 mg/dL — ABNORMAL HIGH (ref 70–99)
Potassium: 4.3 mEq/L (ref 3.5–5.1)
Sodium: 137 mEq/L (ref 135–145)
Total Bilirubin: 0.6 mg/dL (ref 0.2–1.2)
Total Protein: 6.9 g/dL (ref 6.0–8.3)

## 2023-07-24 LAB — TSH: TSH: 1.23 u[IU]/mL (ref 0.35–5.50)

## 2023-07-24 LAB — VITAMIN D 25 HYDROXY (VIT D DEFICIENCY, FRACTURES): VITD: 47.98 ng/mL (ref 30.00–100.00)

## 2023-07-24 MED ORDER — MECLIZINE HCL 12.5 MG PO TABS
12.5000 mg | ORAL_TABLET | Freq: Two times a day (BID) | ORAL | 0 refills | Status: DC | PRN
Start: 2023-07-24 — End: 2024-04-13

## 2023-07-24 NOTE — Patient Instructions (Signed)
Go to lab  Vertigo Vertigo is the feeling that you or the things around you are moving or spinning when they're not. It's different than feeling dizzy. It can also cause: Loss of balance. Trouble standing or walking. Nausea and vomiting. This feeling can come and go at any time. It can last from a few seconds to minutes or even hours. It may go away on its own or be treated with medicine. What are the types of vertigo? There are two types of vertigo: Peripheral vertigo happens when parts of your inner ear don't work like they should. This is the more common type. Central vertigo happens when your brain and spinal cord don't work like they should. Your health care provider will do tests to find out what kind of vertigo you have. This will help them decide on the right treatment for you. Follow these instructions at home: Eating and drinking Drink enough fluid to keep your pee (urine) pale yellow. Do not drink alcohol. Activity When you get up in the morning, first sit up on the side of the bed. When you feel okay, stand slowly while holding onto something. Move slowly. Avoid sudden body or head movements. Avoid certain positions, as told by your provider. Use a cane if you have trouble standing or walking. Sit down right away if you feel unsteady. Place items in your home so they're easy for you to reach without bending or leaning over. Return to normal activities when you're told. Ask what things are safe for you to do. General instructions Take your medicines only as told by your provider. Contact a health care provider if: Your medicines don't help or make your vertigo worse. You get new symptoms. You have a fever. You have nausea or vomiting. Your family or friends spot any changes in how you're acting. A part of your body goes numb. You feel tingling and prickling in a part of your body. You get very bad headaches. Get help right away if: You're always dizzy or you  faint. You have a stiff neck. You have trouble moving or speaking. Your hands, arms, or legs feel weak. Your hearing or eyesight changes. These symptoms may be an emergency. Call 911 right away. Do not wait to see if the symptoms will go away. Do not drive yourself to the hospital. This information is not intended to replace advice given to you by your health care provider. Make sure you discuss any questions you have with your health care provider. Document Revised: 03/14/2023 Document Reviewed: 03/14/2023 Elsevier Patient Education  2024 [employer].

## 2023-07-24 NOTE — Progress Notes (Signed)
Stable Follow instructions as discussed during office visit.

## 2023-07-24 NOTE — Progress Notes (Signed)
Established Patient Visit  Patient: Betty Holmes   DOB: 1956/06/15   67 y.o. Female  MRN: [id number] Visit Date: 07/24/2023  Subjective:    Chief Complaint  Patient presents with   Dizziness    Fasting-  - She c/o spinning, even while lying down. Almost feels like motion sickness. Started Monday    Dizziness This is a recurrent problem. The current episode started more than 1 month ago. The problem occurs intermittently. The problem has been waxing and waning. Associated symptoms include nausea and vertigo. Pertinent negatives include no abdominal pain, anorexia, arthralgias, congestion, coughing, diaphoresis, fatigue, fever, headaches, joint swelling, myalgias, neck pain, numbness, rash, sore throat, swollen glands, urinary symptoms, visual change, vomiting or weakness. The symptoms are aggravated by bending. She has tried rest for the symptoms. The treatment provided significant relief.  Denies any head injury  Reviewed medical, surgical, and social history today  Medications: Outpatient Medications Prior to Visit  Medication Sig   clotrimazole (LOTRIMIN) 1 % cream Apply topically 2 (two) times daily.   fluconazole (DIFLUCAN) 150 MG tablet Take 1 tablet (150 mg total) by mouth daily. Take second tab 3days apart from first tab   hydrOXYzine (ATARAX) 25 MG tablet TAKE 1 TABLET (25 MG TOTAL) BY MOUTH DAILY AS NEEDED FOR ANXIETY OR ITCHING.   No facility-administered medications prior to visit.   Reviewed past medical and social history.   ROS per HPI above      Objective:  BP 110/70 (BP Location: Left Arm, Patient Position: Sitting, Cuff Size: Normal)   Pulse 72   Temp 98 F (36.7 C) (Temporal)   Resp 16   Ht 5\' 3"  (1.6 m)   Wt 188 lb (85.3 kg)   SpO2 98%   BMI 33.30 kg/m      Physical Exam Vitals reviewed.  HENT:     Right Ear: Hearing, tympanic membrane, ear canal and external ear normal.     Left Ear: Hearing, tympanic membrane, ear canal and  external ear normal.     Nose: Nose normal.     Right Nostril: No occlusion.     Left Nostril: No occlusion.     Right Turbinates: Not swollen.     Left Turbinates: Not swollen.     Right Sinus: No maxillary sinus tenderness or frontal sinus tenderness.     Left Sinus: No maxillary sinus tenderness or frontal sinus tenderness.  Musculoskeletal:     Cervical back: Normal range of motion and neck supple.  Neurological:     Mental Status: She is alert and oriented to person, place, and time.     Cranial Nerves: No cranial nerve deficit.     Coordination: Coordination is intact.     Gait: Gait is intact.     Comments: Reports vertigo with supine position and head turned to left. No nystagmus noted. Vertigo resolves when she turn right and sits upright.     No results found for any visits on 07/24/23.    Assessment & Plan:    Problem List Items Addressed This Visit       Other   Vitamin D deficiency   Relevant Orders   VITAMIN D 25 Hydroxy (Vit-D Deficiency, Fractures)   Other Visit Diagnoses     Benign paroxysmal positional vertigo of left ear    -  Primary   Relevant Medications   meclizine (ANTIVERT) 12.5 MG tablet  Other Relevant Orders   CBC   Comprehensive metabolic panel   TSH      Return if symptoms worsen or fail to improve.     Alysia Penna, NP

## 2023-08-13 ENCOUNTER — Encounter: Payer: Self-pay | Admitting: Obstetrics and Gynecology

## 2023-08-13 ENCOUNTER — Ambulatory Visit (INDEPENDENT_AMBULATORY_CARE_PROVIDER_SITE_OTHER): Payer: 59 | Admitting: Obstetrics and Gynecology

## 2023-08-13 ENCOUNTER — Other Ambulatory Visit (HOSPITAL_COMMUNITY)
Admission: RE | Admit: 2023-08-13 | Discharge: 2023-08-13 | Disposition: A | Discharge status: Home / Self Care | Payer: 59 | Source: Other Acute Inpatient Hospital | Attending: Obstetrics and Gynecology | Admitting: Obstetrics and Gynecology

## 2023-08-13 VITALS — BP 100/66 | HR 60 | Ht 60.8 in | Wt 192.4 lb

## 2023-08-13 DIAGNOSIS — R351 Nocturia: Secondary | ICD-10-CM

## 2023-08-13 DIAGNOSIS — R350 Frequency of micturition: Secondary | ICD-10-CM | POA: Diagnosis not present

## 2023-08-13 DIAGNOSIS — R21 Rash and other nonspecific skin eruption: Secondary | ICD-10-CM

## 2023-08-13 DIAGNOSIS — N8110 Cystocele, unspecified: Secondary | ICD-10-CM | POA: Diagnosis not present

## 2023-08-13 DIAGNOSIS — R82998 Other abnormal findings in urine: Secondary | ICD-10-CM

## 2023-08-13 DIAGNOSIS — R152 Fecal urgency: Secondary | ICD-10-CM

## 2023-08-13 DIAGNOSIS — N952 Postmenopausal atrophic vaginitis: Secondary | ICD-10-CM | POA: Diagnosis not present

## 2023-08-13 DIAGNOSIS — N816 Rectocele: Secondary | ICD-10-CM | POA: Diagnosis not present

## 2023-08-13 DIAGNOSIS — R159 Full incontinence of feces: Secondary | ICD-10-CM

## 2023-08-13 LAB — POCT URINALYSIS DIPSTICK
Bilirubin, UA: NEGATIVE
Glucose, UA: NEGATIVE
Ketones, UA: NEGATIVE
Nitrite, UA: NEGATIVE
Protein, UA: NEGATIVE
Spec Grav, UA: 1.01 (ref 1.010–1.025)
Urobilinogen, UA: 0.2 E.U./dL
pH, UA: 5.5 (ref 5.0–8.0)

## 2023-08-13 MED ORDER — CLOBETASOL PROPIONATE E 0.05 % EX CREA: 1.0000 | TOPICAL_CREAM | Freq: Every evening | CUTANEOUS | 2 refills | Status: DC

## 2023-08-13 MED ORDER — ESTRADIOL 0.1 MG/GM VA CREA
0.5000 g | TOPICAL_CREAM | VAGINAL | 11 refills | Status: DC
Start: 2023-08-14 — End: 2023-09-11

## 2023-08-13 NOTE — Patient Instructions (Addendum)
Today we talked about ways to manage bladder urgency such as altering your diet to avoid irritative beverages and foods (bladder diet) as well as attempting to decrease stress and other exacerbating factors.  There is a website with helpful information for people with bladder irritation, called the IC Network at https://www.ic-network.com. This website has more information about a healthy bladder diet and patient forums for support.  The Most Bothersome Foods* The Least Bothersome Foods*  Coffee - Regular & Decaf Tea - caffeinated Carbonated beverages - cola, non-colas, diet & caffeine-free Alcohols - Beer, Red Wine, White Wine, [address] - Grapefruit, [place], Orange, [employer] - Cranberry, Grapefruit, Orange, Pineapple Vegetables - Tomato & Tomato Products Flavor Enhancers - Hot peppers, Spicy foods, Chili, Horseradish, Vinegar, Monosodium glutamate (MSG) Artificial Sweeteners - NutraSweet, Sweet 'N Low, Equal (sweetener), Saccharin Ethnic foods - [place], [place], [place] food [employer] - low-fat & whole Fruits - Bananas, Blueberries, Honeydew melon, Pears, Raisins, Watermelon Vegetables - Broccoli, [address] Sprouts, [place], Carrots, Cauliflower, [place], Cucumber, Mushrooms, Peas, Radishes, Squash, Zucchini, White potatoes, Sweet potatoes & yams Poultry - Chicken, Eggs, [place], [employer] - Beef, Diplomatic Services operational officer, Lamb Seafood - Shrimp, [place] fish, Salmon Grains - Oat, Rice Snacks - Pretzels, Popcorn  *Lenward Chancellor et al. Diet and its role in interstitial cystitis/bladder pain syndrome (IC/BPS) and comorbid conditions. [employer]. BJU Int. 2012 Jan 11.    Start estrogen cream nightly until you return for a pessary fitting.   Do the clobetasol for the dermatitis for the skin irritation nightly for the next week.   Start pelvic floor PT for the incontinence.   Start fiber to help with stool bulking.

## 2023-08-13 NOTE — Progress Notes (Unsigned)
[facility] New Patient Evaluation and Consultation  Referring Provider: Nche, Bonna Gains, NP PCP: Anne Ng, NP Date of Service: 08/13/2023  SUBJECTIVE Chief Complaint: New Patient (Initial Visit) Betty Holmes is a 67 y.o. female is here for Prolapse. Patient has Pessary in place.)  History of Present Illness: Betty Holmes is a 67 y.o. White or Caucasian female seen in consultation at the request of Dr. Elease Etienne for evaluation of prolapse. Also endorses IBS and she uses low FODMAP diet.   Review of records significant for: Has had a pessary for about 10 years. She reports vaginal bleeding. She takes the pessary out almost every day. She reports she has to remove it to have a bowel movement. Denies use of vaginal estrogen. Denies ever doing pelvic floor PT.   Urinary Symptoms: Does not leak urine.    Day time voids 6.  Nocturia: 4-7 times per night to void. Voiding dysfunction: she empties her bladder well.  does not use a catheter to empty bladder.  When urinating, she feels the need to urinate multiple times in a row Drinks: 24oz Coffee, 24oz Sparkling water/fizzy water per day  UTIs: 0 UTI's in the last year.   Denies history of blood in urine, kidney or bladder stones, pyelonephritis, bladder cancer, and kidney cancer  Pelvic Organ Prolapse Symptoms:                  She Admits to a feeling of a bulge the vaginal area. It has been present for 10 years.  She Denies seeing a bulge.  This bulge is bothersome.  Bowel Symptom: Bowel movements: 5 time(s) per day Stool consistency: soft  Straining: yes.  Splinting: no.  Incomplete evacuation: yes.  She Admits to accidental bowel leakage / fecal incontinence  Occurs: 2 time(s) per month  Consistency with leakage: liquid Bowel regimen: fiber Last colonoscopy: Never, has done Cologuard  Sexual Function Sexually active: no.  Sexual orientation: Straight Pain with sex: No  Pelvic  Pain Denies pelvic pain    Past Medical History:  Past Medical History:  Diagnosis Date   Anxiety    Arthritis    At high risk for tick borne illness    had abx--test her again--she was okey---at [facility]   Depression    Fibromyalgia    Fibromyalgia      Past Surgical History:   Past Surgical History:  Procedure Laterality Date   cataracts removal (bilateral) Bilateral    CESAREAN SECTION     CYST EXCISION Right 05/09/2017   Procedure: EXCISION RIGHT INGUINAL CYST;  Surgeon: Berna Bue, MD;  Location: [facility];  Service: General;  Laterality: Right;   JOINT REPLACEMENT     KNEE ARTHROSCOPY     KNEE SURGERY     OVARIAN CYST REMOVAL       Past OB/GYN History: G4 P4 Vaginal deliveries: 3,  Forceps/ Vacuum deliveries: Forceps with second child, Cesarean section: 1 Menopausal: Yes Last pap smear was 11/11/2017.  Any history of abnormal pap smears: no.   Medications: She has a current medication list which includes the following prescription(s): clobetasol propionate e, clotrimazole, [START ON 08/14/2023] estradiol, fluconazole, hydroxyzine, and meclizine.   Allergies: Patient is allergic to penicillins and mineral oil.   Social History:  Social History   Tobacco Use   Smoking status: Never   Smokeless tobacco: Never  Vaping Use   Vaping status: Never Used  Substance Use Topics   Alcohol  use: No   Drug use: No    Relationship status: married She lives with husband.   She is not employed. Regular exercise: Yes: swimming History of abuse: No  Family History:   Family History  Problem Relation Age of Onset   Alzheimer's disease Mother    Heart disease Father    Alzheimer's disease Maternal Aunt    Alzheimer's disease Maternal Uncle    Alzheimer's disease Maternal Grandmother      Review of Systems: Review of Systems  Constitutional:  Positive for malaise/fatigue. Negative for chills and fever.       +Weight Gain   Respiratory:  Negative for cough and shortness of breath.   Cardiovascular:  Negative for chest pain and palpitations.  Gastrointestinal:  Negative for abdominal pain, blood in stool, constipation and diarrhea.  Skin:  Negative for rash.  Neurological:  Positive for dizziness and headaches. Negative for weakness.  Endo/Heme/Allergies:  Does not bruise/bleed easily.  Psychiatric/Behavioral:  Positive for depression. Negative for suicidal ideas. The patient is nervous/anxious.      OBJECTIVE Physical Exam: Vitals:   08/13/23 0926  BP: 100/66  Pulse: 60  Weight: 192 lb 6.4 oz (87.3 kg)  Height: 5' 0.8" (1.544 m)    Physical Exam Constitutional:      Appearance: Normal appearance.  Abdominal:     General: Abdomen is flat.     Palpations: Abdomen is soft.  Skin:    General: Skin is warm and dry.  Neurological:     Mental Status: She is alert and oriented to person, place, and time.  Psychiatric:        Mood and Affect: Mood normal.        Thought Content: Thought content normal.        Judgment: Judgment normal.      GU / Detailed Urogynecologic Evaluation:  Pelvic Exam: Normal external female genitalia; Bartholin's and Skene's glands normal in appearance; urethral meatus normal in appearance, no urethral masses or discharge.   CST: negative   Speculum exam reveals normal vaginal mucosa with atrophy. Cervix normal appearance. Uterus normal single, nontender. Adnexa normal adnexa.    With apex supported, anterior compartment defect was present  Pelvic floor strength I/V  Pelvic floor musculature: Right levator non-tender, Right obturator non-tender, Left levator non-tender, Left obturator non-tender  POP-Q:   POP-Q  -1.5                                            Aa   -1.5                                           Ba  -6                                              C   5.5                                            Gh  4.5  Pb  8.5                                            tvl   -1                                            Ap  -1                                            Bp  -6.5                                              D      Rectal Exam:  Normal external exam.  Post-Void Residual (PVR) by Bladder Scan: In order to evaluate bladder emptying, we discussed obtaining a postvoid residual and she agreed to this procedure.  Procedure: The ultrasound unit was placed on the patient's abdomen in the suprapubic region after the patient had voided. A PVR of 4 ml was obtained by bladder scan.  Laboratory Results: POC Urine: Positive for small blood and small leukocytes  ASSESSMENT AND PLAN Betty Holmes is a 67 y.o. with:  1. Prolapse of anterior vaginal wall   2. Prolapse of posterior vaginal wall   3. Vaginal atrophy   4. Nocturia   5. Urinary frequency   6. Leukocytes in urine   7. Incontinence of feces with fecal urgency    Patient has stage I/IV Anterior, I/IV Uterine, and II/IV posterior prolapse.  She has had a pessary in place, so her prolapse may be more pronounced with it out. She has had a size 5 ring pessary with support but it is not reducing her posterior prolapse. We discussed that we can try a pessary fitting with a different style of pessary or we can discuss surgical options. She would like to try a different pessary. Will plan for pessary fitting.  Patient has vaginal atrophy on exam. She would benefit from estrogen cream. Patient to use a blueberry sized amount into the vagina. She may use this nightly for 2 weeks and then twice weekly after. We discussed using her finger instead of using the applicator.   We discussed the symptoms of overactive bladder (OAB), which include urinary urgency, urinary frequency, nocturia, with or without urge incontinence.  While we do not know the exact etiology of OAB, several treatment options exist. We discussed management including  behavioral therapy (decreasing bladder irritants, urge suppression strategies, timed voids, bladder retraining), physical therapy, medication; for refractory cases posterior tibial nerve stimulation, sacral neuromodulation, and intravesical botulinum toxin injection. For anticholinergic medications, we discussed the potential side effects of anticholinergics including dry eyes, dry mouth, constipation, cognitive impairment and urinary retention. For Beta-3 agonist medication, we discussed the potential side effect of elevated blood pressure which is more likely to occur in individuals with uncontrolled hypertension. Will start patient in pelvic floor PT for her OAB symptoms as well as her FI. POC Urine today has positive leukocytes, will send for culture to rule out UTI.  POC urine sent for culture.  Patient has some frequent loss of stools (A few times a month). Will plan to start patient on pelvic floor PT due to her poor muscle tone in the pelvic floor.   Patient to follow up for pessary fitting     Selmer Dominion, NP

## 2023-08-15 ENCOUNTER — Encounter: Payer: Self-pay | Admitting: Obstetrics and Gynecology

## 2023-08-15 LAB — URINE CULTURE: Culture: 70000 — AB

## 2023-08-15 MED ORDER — NITROFURANTOIN MONOHYD MACRO 100 MG PO CAPS
100.0000 mg | ORAL_CAPSULE | Freq: Two times a day (BID) | ORAL | 0 refills | Status: AC
Start: 2023-08-15 — End: 2023-08-20

## 2023-08-15 NOTE — Addendum Note (Signed)
Addended by: Selmer Dominion on: 08/15/2023 09:31 AM   Modules accepted: Orders

## 2023-08-18 NOTE — Telephone Encounter (Signed)
LM on the VM for the patient to call back [id number] cream

## 2023-08-20 ENCOUNTER — Encounter: Payer: Self-pay | Admitting: Nurse Practitioner

## 2023-08-20 DIAGNOSIS — H8112 Benign paroxysmal vertigo, left ear: Secondary | ICD-10-CM

## 2023-08-27 ENCOUNTER — Ambulatory Visit (INDEPENDENT_AMBULATORY_CARE_PROVIDER_SITE_OTHER): Payer: 59 | Admitting: Obstetrics and Gynecology

## 2023-08-27 ENCOUNTER — Encounter: Payer: Self-pay | Admitting: Obstetrics and Gynecology

## 2023-08-27 VITALS — BP 108/72 | HR 60

## 2023-08-27 DIAGNOSIS — N816 Rectocele: Secondary | ICD-10-CM

## 2023-08-27 DIAGNOSIS — N8110 Cystocele, unspecified: Secondary | ICD-10-CM | POA: Diagnosis not present

## 2023-08-27 DIAGNOSIS — R350 Frequency of micturition: Secondary | ICD-10-CM

## 2023-08-27 NOTE — Progress Notes (Addendum)
[facility]   Subjective:     Chief Complaint: Pessary Fitting [name] is a 67 y.o. female is here for pessary fitting)  History of Present Illness: Betty Holmes is a 67 y.o. female with stage II pelvic organ prolapse who presents today for a pessary fitting.    Past Medical History: Patient  has a past medical history of Anxiety, Arthritis, At high risk for tick borne illness, Depression, Fibromyalgia, and Fibromyalgia.   Past Surgical History: She  has a past surgical history that includes Cesarean section; Knee surgery; Ovarian cyst removal; Knee arthroscopy; Cyst excision (Right, 05/09/2017); Joint replacement; and cataracts removal (bilateral) (Bilateral).   Medications: She has a current medication list which includes the following prescription(s): clobetasol propionate e, clotrimazole, estradiol, fluconazole, hydroxyzine, and meclizine.   Allergies: Patient is allergic to penicillins and mineral oil.   Social History: Patient  reports that she has never smoked. She has never used smokeless tobacco. She reports that she does not drink alcohol and does not use drugs.      Objective:    BP 108/72   Pulse 60  Gen: No apparent distress, A&O x 3. Pelvic Exam: Normal external female genitalia; Bartholin's and Skene's glands normal in appearance; urethral meatus normal in appearance, no urethral masses or discharge.   A size #5 incontinence dish pessary (Lot [id number]) was fitted. It was comfortable, stayed in place with valsalva and was an appropriate size on examination, with one finger fitting between the pessary and the vaginal walls. Patient demonstrated proper removal and replacement and was able to urinate without pessary expulsion.     Assessment/Plan:    Assessment: Betty Holmes is a 67 y.o. with stage II pelvic organ prolapse who presents for a pessary fitting. Plan: She was fitted with a #5 incontinence dish pessary. She will remove  once a week at least . She will use estrogen.   Follow-up in 6 weeks for a pessary check or sooner as needed.  All questions were answered.    Selmer Dominion, NP

## 2023-08-28 ENCOUNTER — Ambulatory Visit: Payer: 59 | Admitting: Obstetrics and Gynecology

## 2023-09-01 ENCOUNTER — Encounter: Payer: Self-pay | Admitting: Physical Therapy

## 2023-09-01 ENCOUNTER — Ambulatory Visit: Payer: 59 | Attending: Nurse Practitioner | Admitting: Physical Therapy

## 2023-09-01 DIAGNOSIS — H8112 Benign paroxysmal vertigo, left ear: Secondary | ICD-10-CM | POA: Diagnosis not present

## 2023-09-01 NOTE — Therapy (Unsigned)
OUTPATIENT PHYSICAL THERAPY VESTIBULAR EVALUATION     Patient Name: Betty Holmes MRN: [id number] DOB:June 16, 1956, 67 y.o., female Today's Date: 09/01/2023  END OF SESSION:   Past Medical History:  Diagnosis Date   Anxiety    Arthritis    At high risk for tick borne illness    had abx--test her again--she was okey---at [facility]   Depression    Fibromyalgia    Fibromyalgia    Past Surgical History:  Procedure Laterality Date   cataracts removal (bilateral) Bilateral    CESAREAN SECTION     CYST EXCISION Right 05/09/2017   Procedure: EXCISION RIGHT INGUINAL CYST;  Surgeon: Berna Bue, MD;  Location: [facility];  Service: General;  Laterality: Right;   JOINT REPLACEMENT     KNEE ARTHROSCOPY     KNEE SURGERY     OVARIAN CYST REMOVAL     Patient Active Problem List   Diagnosis Date Noted   Urinary incontinence, mixed 07/15/2023   Chronic diarrhea 10/14/2022   Obesity 10/30/2021   S/P cataract extraction 10/30/2021   Candidal intertrigo 08/22/2021   GAD (generalized anxiety disorder) 09/21/2019   Pain in left wrist 07/24/2017   Painful wrist, right 07/24/2017   Osteopenia 04/21/2017   Cervical stenosis of spine 04/21/2017   Vitamin D deficiency 04/21/2017   Recurrent major depressive disorder, in partial remission (HCC) 04/09/2017   Chronic low back pain 04/09/2017   Fibromyalgia     PCP: *** REFERRING PROVIDER: ***  REFERRING DIAG: ***  THERAPY DIAG:  No diagnosis found.  ONSET DATE: July 21, 2023  Rationale for Evaluation and Treatment: {HABREHAB:27488}  SUBJECTIVE:   SUBJECTIVE STATEMENT: Pt reports episode occurred in the morning when she woke up; has had nausea; has a static sound in her ears, "thinks I hear music - sounds like faint music" - wears hearing aids; feels it with cleaning out litter box and with getting item out of refrigerator Pt accompanied by: {accompnied:27141}  PERTINENT HISTORY:  ***  PAIN:  Are you having pain? No  PRECAUTIONS: {Therapy precautions:24002}  RED FLAGS: {PT Red Flags:29287}   WEIGHT BEARING RESTRICTIONS: {Yes ***/No:24003}  FALLS: Has patient fallen in last 6 months? {fallsyesno:27318}  LIVING ENVIRONMENT: Lives with: {OPRC lives with:25569::"lives with their family"} Lives in: {Lives in:25570} Stairs: {opstairs:27293} Has following equipment at home: {Assistive devices:23999}  PLOF: {PLOF:24004}  PATIENT GOALS: resolve the vertigo  OBJECTIVE:   DIAGNOSTIC FINDINGS: ***  COGNITION: Overall cognitive status: {cognition:24006}   SENSATION: {sensation:27233}  EDEMA:  {edema:24020}  MUSCLE TONE:  {LE tone:25568}  DTRs:  {DTR SITE:24025}  POSTURE:  {posture:25561}  Cervical ROM:    {AROM/PROM:27142} A/PROM (deg) eval  Flexion   Extension   Right lateral flexion   Left lateral flexion   Right rotation   Left rotation   (Blank rows = not tested)  STRENGTH: ***  LOWER EXTREMITY MMT:   MMT Right eval Left eval  Hip flexion    Hip abduction    Hip adduction    Hip internal rotation    Hip external rotation    Knee flexion    Knee extension    Ankle dorsiflexion    Ankle plantarflexion    Ankle inversion    Ankle eversion    (Blank rows = not tested)  BED MOBILITY:  {Bed mobility:24027}  TRANSFERS: Assistive device utilized: {Assistive devices:23999}  Sit to stand: {Levels of assistance:24026} Stand to sit: {Levels of assistance:24026} Chair to chair: {Levels of assistance:24026} Floor: {Levels  of assistance:24026}  RAMP: {Levels of assistance:24026}  CURB: {Levels of assistance:24026}  GAIT: Gait pattern: {gait characteristics:25376} Distance walked: *** Assistive device utilized: {Assistive devices:23999} Level of assistance: {Levels of assistance:24026} Comments: ***  FUNCTIONAL TESTS:  {Functional tests:24029}  PATIENT SURVEYS:  {rehab surveys:24030}  VESTIBULAR  ASSESSMENT:  GENERAL OBSERVATION: ***   SYMPTOM BEHAVIOR:  Subjective history: ***  Non-Vestibular symptoms: tinnitus and nausea/vomiting  Type of dizziness: {Type of Dizziness:25255}  Frequency: daily depending on the movement  Duration: ***  Aggravating factors: {Aggravating Factors:25258}  Relieving factors: {Relieving Factors:25259}  Progression of symptoms: {DESC; BETTER/WORSE:18575}  OCULOMOTOR EXAM:  Ocular Alignment: {Ocular Alignment:25262}  Ocular ROM: {RANGE OF MOTION:21649}  Spontaneous Nystagmus: {Spontaneous nystagmus:25263}  Gaze-Induced Nystagmus: {gaze-induced nystagmus:25264}  Smooth Pursuits: {smooth pursuit:25265}  Saccades: {saccades:25266}  Convergence/Divergence: *** cm   FRENZEL - FIXATION SUPRESSED:  Ocular Alignment: {Ocular Alignment:25262}  Spontaneous Nystagmus: {Spontaneous nystagmus:25263}  Gaze-Induced Nystagmus: {gaze-induced nystagmus:25264}  Horizontal head shaking - induced nystagmus: {head shaking induced nystagmus:25267}  Vertical head shaking - induced nystagmus: {head shaking induced nystagmus:25267}  Positional tests: {Positional tests:25271}  Pressure tests: {frenzel pressure tests:25268}  VESTIBULAR - OCULAR REFLEX:   Slow VOR: {slow VOR:25290}  VOR Cancellation: {vor cancellation:25291}  Head-Impulse Test: {head impulse test:25272}  Dynamic Visual Acuity: {dynamic visual acuity:25273}   POSITIONAL TESTING: {Positional tests:25271}  MOTION SENSITIVITY:  Motion Sensitivity Quotient Intensity: 0 = none, 1 = Lightheaded, 2 = Mild, 3 = Moderate, 4 = Severe, 5 = Vomiting  Intensity  1. Sitting to supine   2. Supine to L side   3. Supine to R side   4. Supine to sitting   5. L Hallpike-Dix   6. Up from L    7. R Hallpike-Dix   8. Up from R    9. Sitting, head tipped to L knee   10. Head up from L knee   11. Sitting, head tipped to R knee   12. Head up from R knee   13. Sitting head turns x5   14.Sitting head nods x5   15.  In stance, 180 turn to L    16. In stance, 180 turn to R     OTHOSTATICS: {Exam; orthostatics:31331}  FUNCTIONAL GAIT: {Functional tests:24029}   VESTIBULAR TREATMENT:                                                                                                   DATE: ***  Canalith Repositioning:  {Canalith Repositioning:25283} Gaze Adaptation:  {gaze adaptation:25286} Habituation:  {habituation:25288} Other: ***  PATIENT EDUCATION: Education details: *** Person educated: {Person educated:25204} Education method: {Education Method:25205} Education comprehension: {Education Comprehension:25206}  HOME EXERCISE PROGRAM:  GOALS: Goals reviewed with patient? {yes/no:20286}  SHORT TERM GOALS: Target date: ***  *** Baseline: Goal status: {GOALSTATUS:25110}  2.  *** Baseline:  Goal status: {GOALSTATUS:25110}  3.  *** Baseline:  Goal status: {GOALSTATUS:25110}  4.  *** Baseline:  Goal status: {GOALSTATUS:25110}  5.  *** Baseline:  Goal status: {GOALSTATUS:25110}  6.  *** Baseline:  Goal status: {GOALSTATUS:25110}  LONG TERM GOALS: Target date: ***  *** Baseline:  Goal status: {GOALSTATUS:25110}  2.  *** Baseline:  Goal status: {GOALSTATUS:25110}  3.  *** Baseline:  Goal status: {GOALSTATUS:25110}  4.  *** Baseline:  Goal status: {GOALSTATUS:25110}  5.  *** Baseline:  Goal status: {GOALSTATUS:25110}  6.  *** Baseline:  Goal status: {GOALSTATUS:25110}  ASSESSMENT:  CLINICAL IMPRESSION: Patient is a *** y.o. *** who was seen today for physical therapy evaluation and treatment for ***.   OBJECTIVE IMPAIRMENTS: {opptimpairments:25111}.   ACTIVITY LIMITATIONS: {activitylimitations:27494}  PARTICIPATION LIMITATIONS: {participationrestrictions:25113}  PERSONAL FACTORS: {Personal factors:25162} are also affecting patient's functional outcome.   REHAB POTENTIAL: {rehabpotential:25112}  CLINICAL DECISION MAKING: {clinical decision  making:25114}  EVALUATION COMPLEXITY: {Evaluation complexity:25115}   PLAN:  PT FREQUENCY: {rehab frequency:25116}  PT DURATION: {rehab duration:25117}  PLANNED INTERVENTIONS: {rehab planned interventions:25118::"Therapeutic exercises","Therapeutic activity","Neuromuscular re-education","Balance training","Gait training","Patient/Family education","Self Care","Joint mobilization"}  PLAN FOR NEXT SESSION: ***   Kary Kos, PT 09/01/2023, 8:54 AM

## 2023-09-01 NOTE — Patient Instructions (Signed)
How to Perform the Epley Maneuver The Epley maneuver is an exercise that relieves symptoms of vertigo. Vertigo is the feeling that you or your surroundings are moving when they are not. When you feel vertigo, you may feel like the room is spinning and may have trouble walking. The Epley maneuver is used for a type of vertigo caused by a calcium deposit in a part of the inner ear. The maneuver involves changing head positions to help the deposit move out of the area. You can do this maneuver at home whenever you have symptoms of vertigo. You can repeat it in 24 hours if your vertigo has not gone away. Even though the Epley maneuver may relieve your vertigo for a few weeks, it is possible that your symptoms will return. This maneuver relieves vertigo, but it does not relieve dizziness. What are the risks? If it is done correctly, the Epley maneuver is considered safe. Sometimes it can lead to dizziness or nausea that goes away after a short time. If you develop other symptoms--such as changes in vision, weakness, or numbness--stop doing the maneuver and call your health care provider. Supplies needed: A bed or table. A pillow. How to do the Epley maneuver     Sit on the edge of a bed or table with your back straight and your legs extended or hanging over the edge of the bed or table. Turn your head halfway toward the affected ear or side as told by your health care provider. Lie backward quickly with your head turned until you are lying flat on your back. Your head should dangle (head-hanging position). You may want to position a pillow under your shoulders. Hold this position for at least 30 seconds. If you feel dizzy or have symptoms of vertigo, continue to hold the position until the symptoms stop. Turn your head to the opposite direction until your unaffected ear is facing down. Your head should continue to dangle. Hold this position for at least 30 seconds. If you feel dizzy or have symptoms  of vertigo, continue to hold the position until the symptoms stop. Turn your whole body to the same side as your head so that you are positioned on your side. Your head will now be nearly facedown and no longer needs to dangle. Hold for at least 30 seconds. If you feel dizzy or have symptoms of vertigo, continue to hold the position until the symptoms stop. Sit back up. You can repeat the maneuver in 24 hours if your vertigo does not go away. Follow these instructions at home: For 24 hours after doing the Epley maneuver: Keep your head in an upright position. When lying down to sleep or rest, keep your head raised (elevated) with two or more pillows. Avoid excessive neck movements. Activity Do not drive or use machinery if you feel dizzy. After doing the Epley maneuver, return to your normal activities as told by your health care provider. Ask your health care provider what activities are safe for you. General instructions Drink enough fluid to keep your urine pale yellow. Do not drink alcohol. Take over-the-counter and prescription medicines only as told by your health care provider. Keep all follow-up visits. This is important. Preventing vertigo symptoms Ask your health care provider if there is anything you should do at home to prevent vertigo. He or she may recommend that you: Keep your head elevated with two or more pillows while you sleep. Do not sleep on the side of your affected ear. Get  up slowly from bed. Avoid sudden movements during the day. Avoid extreme head positions or movement, such as looking up or bending over. Contact a health care provider if: Your vertigo gets worse. You have other symptoms, including: Nausea. Vomiting. Headache. Get help right away if you: Have vision changes. Have a headache or neck pain that is severe or getting worse. Cannot stop vomiting. Have new numbness or weakness in any part of your body. These symptoms may represent a serious  problem that is an emergency. Do not wait to see if the symptoms will go away. Get medical help right away. Call your local emergency services (911 in the [place] Do not drive yourself to the hospital. Summary Vertigo is the feeling that you or your surroundings are moving when they are not. The Epley maneuver is an exercise that relieves symptoms of vertigo. If the Epley maneuver is done correctly, it is considered safe. This information is not intended to replace advice given to you by your health care provider. Make sure you discuss any questions you have with your health care provider. Document Revised: 11/08/2020 Document Reviewed: 11/08/2020 Elsevier Patient Education  2024 Elsevier Inc.    Self Treatment for Left Posterior / Anterior Canalithiasis    Sitting on bed: 1. Turn head 45 left. (a) Lie back slowly, shoulders on pillow, head on bed. (b) Hold _20___ seconds. 2. Keeping head on bed, turn head 90 right. Hold _20___ seconds. 3. Roll to right, head on 45 angle down toward bed. Hold __20__ seconds. 4. Sit up on right side of bed. Repeat __3__ times per session. Do __2__ sessions per day.  Copyright  VHI. All rights reserved.

## 2023-09-02 ENCOUNTER — Encounter: Payer: Self-pay | Admitting: Obstetrics and Gynecology

## 2023-09-11 ENCOUNTER — Ambulatory Visit (INDEPENDENT_AMBULATORY_CARE_PROVIDER_SITE_OTHER): Payer: 59 | Admitting: Obstetrics and Gynecology

## 2023-09-11 ENCOUNTER — Encounter: Payer: Self-pay | Admitting: Obstetrics and Gynecology

## 2023-09-11 VITALS — BP 115/80 | HR 65

## 2023-09-11 DIAGNOSIS — N816 Rectocele: Secondary | ICD-10-CM

## 2023-09-11 DIAGNOSIS — N8110 Cystocele, unspecified: Secondary | ICD-10-CM

## 2023-09-11 MED ORDER — IMVEXXY STARTER PACK 4 MCG VA INST: 1.0000 | VAGINAL_INSERT | Freq: Every evening | VAGINAL | 0 refills | Status: DC

## 2023-09-11 NOTE — Progress Notes (Signed)
[facility]   Subjective:     Chief Complaint:  Chief Complaint  Patient presents with   Pessary Check   History of Present Illness: Betty Holmes is a 67 y.o. female with stage II pelvic organ prolapse who presents for a pessary check. She is using a size #5 incontinence dish pessary. The pessary has been causing irritation and she has noticed bleeding. She has been trying to use the estrogen cream but has difficulty getting it behind the pessary with her arthritis.   Past Medical History: Patient  has a past medical history of Anxiety, Arthritis, At high risk for tick borne illness, Depression, Fibromyalgia, and Fibromyalgia.   Past Surgical History: She  has a past surgical history that includes Cesarean section; Knee surgery; Ovarian cyst removal; Knee arthroscopy; Cyst excision (Right, 05/09/2017); Joint replacement; and cataracts removal (bilateral) (Bilateral).   Medications: She has a current medication list which includes the following prescription(s): imvexxy starter pack, clobetasol propionate e, clotrimazole, fluconazole, hydroxyzine, and meclizine.   Allergies: Patient is allergic to penicillins and mineral oil.   Social History: Patient  reports that she has never smoked. She has never used smokeless tobacco. She reports that she does not drink alcohol and does not use drugs.      Objective:    Physical Exam: BP 115/80   Pulse 65  Gen: No apparent distress, A&O x 3. Detailed Urogynecologic Evaluation:  Pelvic Exam: Normal external female genitalia; Bartholin's and Skene's glands normal in appearance; urethral meatus normal in appearance, no urethral masses or discharge. The pessary was noted to be dislodged. It was removed and cleaned. Speculum exam revealed no lesions in the vagina. The pessary was changed back to #5 Ring with support. It was comfortable to the patient and fit well but was not providing as much support for the anterior vaginal  wall.       Assessment/Plan:    Assessment: Betty Holmes is a 67 y.o. with stage II pelvic organ prolapse here for a pessary check. She is doing well.  Plan: She will remove once a week . She will continue to use estrogen. She will follow-up in 3 months for a pessary check or sooner as needed. We will try to switch to vaginal pill or suppository for easier use with pessary in place.

## 2023-09-30 ENCOUNTER — Telehealth: Payer: Self-pay

## 2023-09-30 NOTE — Telephone Encounter (Signed)
PA request for Imvexxy starter pack received. Left message for patient to return call for additional information and see if she paid out of pocket.

## 2023-10-01 NOTE — Telephone Encounter (Signed)
Sia returned call and indicated that she didn't pick up the medication as the pharmacy didn't offer to apply any coupons or use [facility]. Will proceed with PA. Cover my meds KEY: [id number]

## 2023-10-02 NOTE — Telephone Encounter (Signed)
PA has been submitted with [email or web address]

## 2023-10-03 NOTE — Telephone Encounter (Signed)
Sharlot Gowda (KeyAutumn Messing) PA Case ID #: [id number] Rx #: [id number]  Outcome Approved on October 10 by [employer] 2017 NCPDP Request Reference Number: [id number].   IMVEXXY STRT SUP is approved through 12/22/2024. Your patient may now fill this prescription and it will be covered. Authorization Expiration Date: 12/22/2024

## 2023-10-03 NOTE — Telephone Encounter (Signed)
Patient has been notified and aware of the approval for the Imvexxy. She will reach out to the pharmacy directly for her cost as she states she can't afford anything to expensive.

## 2023-10-06 ENCOUNTER — Ambulatory Visit (INDEPENDENT_AMBULATORY_CARE_PROVIDER_SITE_OTHER): Payer: 59 | Admitting: Obstetrics and Gynecology

## 2023-10-06 ENCOUNTER — Encounter: Payer: Self-pay | Admitting: Obstetrics and Gynecology

## 2023-10-06 ENCOUNTER — Other Ambulatory Visit (HOSPITAL_COMMUNITY)
Admission: RE | Admit: 2023-10-06 | Discharge: 2023-10-06 | Disposition: A | Discharge status: Home / Self Care | Payer: 59 | Source: Other Acute Inpatient Hospital | Attending: Obstetrics and Gynecology | Admitting: Obstetrics and Gynecology

## 2023-10-06 VITALS — BP 115/82 | HR 69

## 2023-10-06 DIAGNOSIS — R350 Frequency of micturition: Secondary | ICD-10-CM

## 2023-10-06 DIAGNOSIS — N390 Urinary tract infection, site not specified: Secondary | ICD-10-CM | POA: Diagnosis not present

## 2023-10-06 DIAGNOSIS — R8290 Unspecified abnormal findings in urine: Secondary | ICD-10-CM | POA: Insufficient documentation

## 2023-10-06 DIAGNOSIS — N812 Incomplete uterovaginal prolapse: Secondary | ICD-10-CM

## 2023-10-06 DIAGNOSIS — N8110 Cystocele, unspecified: Secondary | ICD-10-CM

## 2023-10-06 DIAGNOSIS — N816 Rectocele: Secondary | ICD-10-CM

## 2023-10-06 LAB — POCT URINALYSIS DIP (CLINITEK)
Bilirubin, UA: NEGATIVE
Glucose, UA: NEGATIVE mg/dL
Ketones, POC UA: NEGATIVE mg/dL
Nitrite, UA: POSITIVE — AB
POC PROTEIN,UA: NEGATIVE
Spec Grav, UA: 1.025 (ref 1.010–1.025)
Urobilinogen, UA: 0.2 U/dL
pH, UA: 7 (ref 5.0–8.0)

## 2023-10-06 MED ORDER — SULFAMETHOXAZOLE-TRIMETHOPRIM 800-160 MG PO TABS
1.0000 | ORAL_TABLET | Freq: Two times a day (BID) | ORAL | 0 refills | Status: AC
Start: 2023-10-06 — End: 2023-10-09

## 2023-10-06 NOTE — Patient Instructions (Addendum)
Coconut oil and Vitamin E are great for the vagina.   Aloe, D-Mannose, and Cranberry can be helpful to incorporate into your diet for prevention of the UTI.

## 2023-10-06 NOTE — Progress Notes (Signed)
[facility]   Subjective:     Chief Complaint: Follow-up Betty Holmes is a 67 y.o. female is here for possible UTI and pessary fitting.)  History of Present Illness: Betty Holmes is a 67 y.o. female with stage II pelvic organ prolapse who presents today for a pessary fitting.    Patient reports that she has been very stressed regarding the progression of prolapse as well as rectocele.  Patient reports she thinks she may be able to close to be hopeful.  She reports she has been straining to have a bowel movement.  Patient also reports she has had increased frequency of urination and is concerned about UTI  Past Medical History: Patient  has a past medical history of Anxiety, Arthritis, At high risk for tick borne illness, Depression, Fibromyalgia, and Fibromyalgia.   Past Surgical History: She  has a past surgical history that includes Cesarean section; Knee surgery; Ovarian cyst removal; Knee arthroscopy; Cyst excision (Right, 05/09/2017); Joint replacement; and cataracts removal (bilateral) (Bilateral).   Medications: She has a current medication list which includes the following prescription(s): clobetasol propionate e, clotrimazole, imvexxy starter pack, hydroxyzine, and meclizine.   Allergies: Patient is allergic to penicillins and mineral oil.   Social History: Patient  reports that she has never smoked. She has never used smokeless tobacco. She reports that she does not drink alcohol and does not use drugs.      Objective:    BP 115/82   Pulse 69  Gen: No apparent distress, A&O x 3. Pelvic Exam: Normal external female genitalia; Bartholin's and Skene's glands normal in appearance; urethral meatus normal in appearance, no urethral masses or discharge.   A size #5 Marland pessary (Lot [id number]) was fitted. It was comfortable, stayed in place with valsalva and was an appropriate size on examination, with one finger fitting between the pessary and  the vaginal walls. Patient was able to remove and replace with me in the room. She was also able to urinate without difficulty.  POP-Q (08/13/23)   -1.5                                            Aa   -1.5                                           Ba   -6                                              C    5.5                                            Gh   4.5                                            Pb   8.5  tvl    -1                                            Ap   -1                                            Bp   -6.5     Lab Results  Component Value Date   COLORU yellow 10/06/2023   CLARITYU cloudy (A) 10/06/2023   GLUCOSEUR negative 10/06/2023   BILIRUBINUR negative 10/06/2023   KETONESU negative 08/13/2023   SPECGRAV 1.025 10/06/2023   RBCUR trace-intact (A) 10/06/2023   PHUR 7.0 10/06/2023   PROTEINUR Negative 08/13/2023   UROBILINOGEN 0.2 10/06/2023   LEUKOCYTESUR Small (1+) (A) 10/06/2023     Assessment/Plan:    Assessment: Ms. Arseneault is a 67 y.o. with stage II pelvic organ prolapse who presents for a pessary fitting. Plan: She was fitted with a #5 Marland pessary. She will remove at least once a week . She will use vitamin E oil or coconut oil.    For patient's constipation issues we discussed that focusing on her breathing during bowel movement exams can be helpful.  We also discussed using her squatty potty which she reports she has been.  I am hopeful that physical therapy can continue to help her with her symptoms.  Patient's urine today was concerning for UTI with positive nitrates, trace intact blood, small leukocytes.  Will send for culture and will treat as a UTI with Bactrim x2 daily for 3 days. Encouraged her to use the estrogen cream just around the urethra twice a week for UTI prevention. Also discussed adding in D-mannose and cranberry extract.    Follow-up in 3 months for a pessary check or  sooner as needed.  All questions were answered.    Selmer Dominion, NP

## 2023-10-08 LAB — URINE CULTURE: Culture: >=100000 — AB

## 2023-10-08 MED ORDER — NITROFURANTOIN MONOHYD MACRO 100 MG PO CAPS: 100.0000 mg | ORAL_CAPSULE | Freq: Two times a day (BID) | ORAL | 0 refills | Status: AC

## 2023-10-08 NOTE — Progress Notes (Signed)
Please call and inform patient that her UTI was resistant to the prescribed antibiotic. New antibiotic sent to pharmacy

## 2023-10-08 NOTE — Addendum Note (Signed)
Addended by: Selmer Dominion on: 10/08/2023 03:54 PM   Modules accepted: Orders

## 2023-10-13 ENCOUNTER — Encounter: Payer: Self-pay | Admitting: Nurse Practitioner

## 2023-10-13 ENCOUNTER — Telehealth: Payer: Self-pay | Admitting: Nurse Practitioner

## 2023-10-13 DIAGNOSIS — F411 Generalized anxiety disorder: Secondary | ICD-10-CM

## 2023-10-13 NOTE — Telephone Encounter (Signed)
Prescription Request  10/13/2023  LOV: 07/24/2023  What is the name of the medication or equipment? hydrOXYzine (ATARAX) 25 MG tablet [[id number]]   Have you contacted your pharmacy to request a refill? No   Which pharmacy would you like this sent to?  [id number] - [place], [place] - [address] AT [facility] [address] [place] [address] Phone: [phone] Fax: [phone]    Patient notified that their request is being sent to the clinical staff for review and that they should receive a response within 2 business days.   Please advise at Mobile [phone] (mobile)

## 2023-10-14 NOTE — Telephone Encounter (Signed)
I called and spoke with patient and she said that she has since found he medicine and did not need a refill sent to [place]. Patient said that she is out of refills and would like a prescription sent to her pharmacy that she can pick up next month.

## 2023-10-22 NOTE — Therapy (Unsigned)
OUTPATIENT PHYSICAL THERAPY FEMALE PELVIC EVALUATION   Patient Name: Betty Holmes MRN: [id number] DOB:Sep 28, 1956, 67 y.o., female Today's Date: 10/23/2023  END OF SESSION:  PT End of Session - 10/23/23 0840     Visit Number 1    Date for PT Re-Evaluation 12/18/23    Authorization Type UHC Medicare/medicaid    Authorization - Visit Number 1    Authorization - Number of Visits 10    PT Start Time 0830    PT Stop Time 0915    PT Time Calculation (min) 45 min    Activity Tolerance Patient tolerated treatment well    Behavior During Therapy WFL for tasks assessed/performed             Past Medical History:  Diagnosis Date   Anxiety    Arthritis    At high risk for tick borne illness    had abx--test her again--she was okey---at [facility]   Depression    Fibromyalgia    Fibromyalgia    Past Surgical History:  Procedure Laterality Date   cataracts removal (bilateral) Bilateral    CESAREAN SECTION     CYST EXCISION Right 05/09/2017   Procedure: EXCISION RIGHT INGUINAL CYST;  Surgeon: Berna Bue, MD;  Location: [facility];  Service: General;  Laterality: Right;   JOINT REPLACEMENT     KNEE ARTHROSCOPY     KNEE SURGERY     OVARIAN CYST REMOVAL     Patient Active Problem List   Diagnosis Date Noted   Urinary incontinence, mixed 07/15/2023   Chronic diarrhea 10/14/2022   Obesity 10/30/2021   S/P cataract extraction 10/30/2021   Candidal intertrigo 08/22/2021   GAD (generalized anxiety disorder) 09/21/2019   Pain in left wrist 07/24/2017   Painful wrist, right 07/24/2017   Osteopenia 04/21/2017   Cervical stenosis of spine 04/21/2017   Vitamin D deficiency 04/21/2017   Recurrent major depressive disorder, in partial remission (HCC) 04/09/2017   Chronic low back pain 04/09/2017   Fibromyalgia     PCP: Anne Ng, NP  REFERRING PROVIDER: Selmer Dominion, NP   REFERRING DIAG: [phone] (ICD-10-CM) -  Incontinence of feces with fecal urgency   THERAPY DIAG:  Muscle weakness (generalized) - Plan: PT plan of care cert/re-cert  Other lack of coordination - Plan: PT plan of care cert/re-cert  Rationale for Evaluation and Treatment: Rehabilitation  ONSET DATE: 2014  SUBJECTIVE:  SUBJECTIVE STATEMENT: Constipation, small nuggets of stool. I am following the low FODMAP diet.  Fluid intake: 24oz Coffee, 24oz Sparkling water/fizzy water per day    PAIN:  Are you having pain? No  PRECAUTIONS: None  RED FLAGS: None   WEIGHT BEARING RESTRICTIONS: No  FALLS:  Has patient fallen in last 6 months? Yes. Number of falls 5 time and does not feel like she has good balance and fall forward on her knees and hands  LIVING ENVIRONMENT: Lives with: lives with their spouse  OCCUPATION: retired  PLOF: Independent  PATIENT GOALS: learn how to manage her prolapse, reduce leakage  PERTINENT HISTORY:  Fibromalgia; cesarean section  BOWEL MOVEMENT: Pain with bowel movement: No Type of bowel movement:Type (Bristol Stool Scale) Type 1, type 4 thin, type 7, Frequency 5 per day, Strain Yes, and Splinting no Fully empty rectum: No, feels like there is a big load in the rectum and not able to get it out Leakage: Yes: 2 times per month  but has not happened since she has changed her diet Fiber supplement: Yes: metamucil gummies  URINATION: Pain with urination: No Fully empty bladder: Yes: needs to urinate multiple times afterwards  especially at night Stream: Strong and urine will spray if not sitting up Urgency: Yes: sometimes she is not able to hold the urine and happens 2 times per month; happens when she gets home and key in the door Frequency: Day time voids 6.  Nocturia: 4-7 times per night to void. Will wake  up 2 times due to strong urge to urinate Leakage: Urge to void and Walking to the bathroom Pads: No  INTERCOURSE:not active  PREGNANCY: Vaginal deliveries 3, forceps with second child with large scar and became infected C-section deliveries 1   PROLAPSE: stage I/IV Anterior, I/IV Uterine, and II/IV posterior prolapse.   OBJECTIVE:  Note: Objective measures were completed at Evaluation unless otherwise noted.  DIAGNOSTIC FINDINGS:  Pelvic floor strength I/V   PVR of 4 ml was obtained by bladder scan.   COGNITION: Overall cognitive status: Within functional limits for tasks assessed       FUNCTIONAL TESTS:  5 times sit to stand: 21.80 sec Single leg stance bil. 6 sec; tandem stance 13 sec   POSTURE: rounded shoulders, forward head, and decreased lumbar lordosis  PELVIC ALIGNMENT:  LUMBARAROM/PROM: decreased by 25%   LOWER EXTREMITY ROM: bilateral hip ROM is full   LOWER EXTREMITY MMT:  MMT Right eval Left eval  Hip flexion 4/5 4/5  Hip extension 4/5 4/5  Hip abduction 3/5 3/5  Knee extension 4/5 4/5   PALPATION:   General  decreased expansion of the lower rib cage , tenderness located in lumbar                External Perineal Exam intact, restrictions along the perineal body from the scars after child birth                             Internal Pelvic Floor thickness along the left side of the pelvic floor  Patient confirms identification and approves PT to assess internal pelvic floor and treatment Yes  PELVIC MMT:   MMT eval  Vaginal 3/5 with weak hug of therapist finger for 4 sec  Internal Anal Sphincter 2/5  External Anal Sphincter 3/5  Puborectalis 2/5   (Blank rows = not tested)        TONE: average   TODAY'S  TREATMENT:                                                                                                                              DATE: 10/23/23  EVAL See below   PATIENT EDUCATION:  10/23/23 Education details: educated  patient to use her estrogen the doctor has given her and how to apply it, educated patient to not urinate when she wakes up in the middle of the night without having the urge to urinate.  Person educated: Patient Education method: Medical illustrator Education comprehension: verbalized understanding  HOME EXERCISE PROGRAM: See above.   ASSESSMENT:  CLINICAL IMPRESSION: Patient is a 67 y.o. female who was seen today for physical therapy evaluation and treatment for fecal incontinence and urgency.  Patient reports 5 falls in the last 6 months and nervous with falling in the community and while walking to the bathroom. Patent has to strain to have a bowel movement and at times she feels a big bowel movement is stuck at the end and she cannot get it out. She has not had the fecal leakage since she was on the FODMAD diet. Patient will leak urine when she has a strong urge and walking to the bathroom. She will wake up 5 times per night but 2 of the times is due to the urge to urinate. After she urinates she may have to go multiple times to empty her bladder. Vaginal strength is 3/5 with a weak hug contracting for 4 sec. External anal sphincter strength is 3/5, internal anal sphincter and puborectalis strength is 2/5. She has thickness along the perineal body and left posterior vaginal wall. She has weakness in the hips. Sit to stand is 21. 8 seconds. Tandem stance 13 sec. Single leg stance is 8 sec. Patient would benefit from skilled therapy to improve pelvic floor strength and coordination to reduce leakage and improve balance to reduce the number of falls.   OBJECTIVE IMPAIRMENTS: decreased activity tolerance, decreased coordination, decreased strength, and increased fascial restrictions.   ACTIVITY LIMITATIONS: continence, toileting, and locomotion level  PARTICIPATION LIMITATIONS: community activity  PERSONAL FACTORS: Age and 1 comorbidity: Fibromalgia; cesarean section  are also affecting  patient's functional outcome.   REHAB POTENTIAL: Excellent  CLINICAL DECISION MAKING: Stable/uncomplicated  EVALUATION COMPLEXITY: Low   GOALS: Goals reviewed with patient? Yes  SHORT TERM GOALS: Target date: 11/20/23  Patient independent with initial HEP for pelvic floor and balance exercises.  Baseline: Goal status: INITIAL  2.  Patient is able to go from sit to stand </= 18 sec.  Baseline:  Goal status: INITIAL   3.  Patient educated on ways to splint the perineal area to improve the expulsion of her stool due to the posterior wall weakness.  Baseline:  Goal status: INITIAL  4.  Patient educated on the urge to void so she is able to walk slowly to the bathroom without leaking urine.  Baseline:  Goal status: INITIAL  LONG TERM GOALS: Target date: 12/18/23  Patient independent with advanced HEP for pelvic floor, core and balance exercises.  Baseline:  Goal status: INITIAL  2.  Patient reports her confidence of not falling has improved >/= 80% when walking quickly to the bathroom due to sit to stand </= 13 sec. Baseline:  Goal status: INITIAL  3.  Patient reports her urinary leakage </= 75% due to improve pelvic floor strength >/= 3/5 holding for 5 seconds vaginally.  Baseline:  Goal status: INITIAL  4.  Rectal strength >/= 4/5 holding for 40 seconds to reduce the chance of fecal leakage.  Baseline:  Goal status: INITIAL    PLAN:  PT FREQUENCY: 2x/week  PT DURATION: 8 weeks  PLANNED INTERVENTIONS: 97110-Therapeutic exercises, 97530- Therapeutic activity, 97112- Neuromuscular re-education, 97140- Manual therapy, Patient/Family education, Balance training, Dry Needling, Cryotherapy, Moist heat, and Biofeedback  PLAN FOR NEXT SESSION: pelvic floor working on pelvic floor contraction, manual work to the perineum and left side of the posterior vaginal wall,  pelvic floor contraction, see how she is doing with the estrace cream Ortho: balance, walking speed,  hips and knee strength, nustep  Eulis Foster, PT 10/23/23 3:14 PM'

## 2023-10-23 ENCOUNTER — Encounter: Payer: Self-pay | Admitting: Physical Therapy

## 2023-10-23 ENCOUNTER — Encounter: Payer: 59 | Attending: Obstetrics and Gynecology | Admitting: Physical Therapy

## 2023-10-23 ENCOUNTER — Other Ambulatory Visit: Payer: Self-pay

## 2023-10-23 DIAGNOSIS — M6281 Muscle weakness (generalized): Secondary | ICD-10-CM | POA: Diagnosis not present

## 2023-10-23 DIAGNOSIS — R278 Other lack of coordination: Secondary | ICD-10-CM | POA: Diagnosis not present

## 2023-10-23 NOTE — Telephone Encounter (Signed)
Called patient to follow up regarding Rx medication. Patient is doing well; medication was received and patient have no concerns or questions at this time.

## 2023-10-24 ENCOUNTER — Telehealth: Payer: Self-pay

## 2023-10-24 NOTE — Patient Outreach (Signed)
[facility] Assistant attempted to call patient on today regarding preventative mammogram screening. No answer from patient after multiple rings. Assistant left confidential voicemail for patient to return call.  Will call back patient back for final attempt.  Baruch Gouty [facility] Assistant [facility] [phone]

## 2023-10-30 ENCOUNTER — Encounter: Payer: 59 | Attending: Obstetrics and Gynecology | Admitting: Physical Therapy

## 2023-10-30 ENCOUNTER — Encounter: Payer: Self-pay | Admitting: Physical Therapy

## 2023-10-30 DIAGNOSIS — R278 Other lack of coordination: Secondary | ICD-10-CM | POA: Diagnosis not present

## 2023-10-30 DIAGNOSIS — R159 Full incontinence of feces: Secondary | ICD-10-CM | POA: Insufficient documentation

## 2023-10-30 DIAGNOSIS — M6281 Muscle weakness (generalized): Secondary | ICD-10-CM | POA: Insufficient documentation

## 2023-10-30 DIAGNOSIS — R152 Fecal urgency: Secondary | ICD-10-CM | POA: Insufficient documentation

## 2023-10-30 NOTE — Therapy (Signed)
OUTPATIENT PHYSICAL THERAPY FEMALE PELVIC TREATMENT   Patient Name: Betty Holmes MRN: [id number] DOB:[birth year], 67 y.o., female Today's Date: 10/30/2023  END OF SESSION:  PT End of Session - 10/30/23 0835     Visit Number 2    Date for PT Re-Evaluation 12/18/23    Authorization Type UHC Medicare/medicaid    Authorization Time Period ----    Authorization - Visit Number 2    Authorization - Number of Visits 10    PT Start Time 0830    PT Stop Time 0915    PT Time Calculation (min) 45 min    Activity Tolerance Patient tolerated treatment well    Behavior During Therapy WFL for tasks assessed/performed             Past Medical History:  Diagnosis Date   Anxiety    Arthritis    At high risk for tick borne illness    had abx--test her again--she was okey---at [facility]   Depression    Fibromyalgia    Fibromyalgia    Past Surgical History:  Procedure Laterality Date   cataracts removal (bilateral) Bilateral    CESAREAN SECTION     CYST EXCISION Right 05/09/2017   Procedure: EXCISION RIGHT INGUINAL CYST;  Surgeon: Berna Bue, MD;  Location: [facility];  Service: General;  Laterality: Right;   JOINT REPLACEMENT     KNEE ARTHROSCOPY     KNEE SURGERY     OVARIAN CYST REMOVAL     Patient Active Problem List   Diagnosis Date Noted   Urinary incontinence, mixed 07/15/2023   Chronic diarrhea 10/14/2022   Obesity 10/30/2021   S/P cataract extraction 10/30/2021   Candidal intertrigo 08/22/2021   GAD (generalized anxiety disorder) 09/21/2019   Pain in left wrist 07/24/2017   Painful wrist, right 07/24/2017   Osteopenia 04/21/2017   Cervical stenosis of spine 04/21/2017   Vitamin D deficiency 04/21/2017   Recurrent major depressive disorder, in partial remission (HCC) 04/09/2017   Chronic low back pain 04/09/2017   Fibromyalgia     PCP: Anne Ng, NP  REFERRING PROVIDER: Selmer Dominion, NP   REFERRING DIAG:  [phone] (ICD-10-CM) - Incontinence of feces with fecal urgency   THERAPY DIAG:  Muscle weakness (generalized)  Other lack of coordination  Rationale for Evaluation and Treatment: Rehabilitation  ONSET DATE: 2014  SUBJECTIVE:                                                                                                                                                                                           SUBJECTIVE STATEMENT: I have  realized that I have no strength in my legs and need to exercise. I am using the estrace cream. I have pushed on the perineum with having a bowel movement and it helps.   Fluid intake: 24oz Coffee, 24oz Sparkling water/fizzy water per day    PAIN:  Are you having pain? No  PRECAUTIONS: None  RED FLAGS: None   WEIGHT BEARING RESTRICTIONS: No  FALLS:  Has patient fallen in last 6 months? Yes. Number of falls 5 time and does not feel like she has good balance and fall forward on her knees and hands  LIVING ENVIRONMENT: Lives with: lives with their spouse  OCCUPATION: retired  PLOF: Independent  PATIENT GOALS: learn how to manage her prolapse, reduce leakage  PERTINENT HISTORY:  Fibromalgia; cesarean section  BOWEL MOVEMENT: Pain with bowel movement: No Type of bowel movement:Type (Bristol Stool Scale) Type 1, type 4 thin, type 7, Frequency 5 per day, Strain Yes, and Splinting no Fully empty rectum: No, feels like there is a big load in the rectum and not able to get it out Leakage: Yes: 2 times per month  but has not happened since she has changed her diet Fiber supplement: Yes: metamucil gummies  URINATION: Pain with urination: No Fully empty bladder: Yes: needs to urinate multiple times afterwards  especially at night Stream: Strong and urine will spray if not sitting up Urgency: Yes: sometimes she is not able to hold the urine and happens 2 times per month; happens when she gets home and key in the door Frequency: Day time  voids 6.  Nocturia: 4-7 times per night to void. Will wake up 2 times due to strong urge to urinate Leakage: Urge to void and Walking to the bathroom Pads: No  INTERCOURSE:not active  PREGNANCY: Vaginal deliveries 3, forceps with second child with large scar and became infected C-section deliveries 1   PROLAPSE: stage I/IV Anterior, I/IV Uterine, and II/IV posterior prolapse.   OBJECTIVE:  Note: Objective measures were completed at Evaluation unless otherwise noted.  DIAGNOSTIC FINDINGS:  Pelvic floor strength I/V   PVR of 4 ml was obtained by bladder scan.   COGNITION: Overall cognitive status: Within functional limits for tasks assessed       FUNCTIONAL TESTS:  5 times sit to stand: 21.80 sec Single leg stance bil. 6 sec; tandem stance 13 sec   POSTURE: rounded shoulders, forward head, and decreased lumbar lordosis  PELVIC ALIGNMENT:  LUMBARAROM/PROM: decreased by 25%   LOWER EXTREMITY ROM: bilateral hip ROM is full   LOWER EXTREMITY MMT:  MMT Right eval Left eval  Hip flexion 4/5 4/5  Hip extension 4/5 4/5  Hip abduction 3/5 3/5  Knee extension 4/5 4/5   PALPATION:   General  decreased expansion of the lower rib cage , tenderness located in lumbar                External Perineal Exam intact, restrictions along the perineal body from the scars after child birth                             Internal Pelvic Floor thickness along the left side of the pelvic floor  Patient confirms identification and approves PT to assess internal pelvic floor and treatment Yes  PELVIC MMT:   MMT eval 10/30/23  Vaginal 3/5 with weak hug of therapist finger for 4 sec 3/5 holding 10 sec  Internal Anal Sphincter 2/5  External Anal Sphincter 3/5   Puborectalis 2/5    (Blank rows = not tested)        TONE: average   TODAY'S TREATMENT:           10/30/23 Manual: Internal pelvic floor techniques: No emotional/communication barriers or cognitive limitation. Patient  is motivated to learn. Patient understands and agrees with treatment goals and plan. PT explains patient will be examined in standing, sitting, and lying down to see how their muscles and joints work. When they are ready, they will be asked to remove their underwear so PT can examine their perineum. The patient is also given the option of providing their own chaperone as one is not provided in our facility. The patient also has the right and is explained the right to defer or refuse any part of the evaluation or treatment including the internal exam. With the patient's consent, PT will use one gloved finger to gently assess the muscles of the pelvic floor, seeing how well it contracts and relaxes and if there is muscle symmetry. After, the patient will get dressed and PT and patient will discuss exam findings and plan of care. PT and patient discuss plan of care, schedule, attendance policy and HEP activities. Going through the vaginal canal working on the sides of the introitus and obturator internist to lengthen the tissue Exercises: Stretches/mobility: Double knees to chest with knees apart and diaphragmatic breathing Piriformis stretch holding 30 sec bil.  Trunk rotation with pulling knee across the body holding 30 sec bil.  Strengthening:  Yoga sequence she does at home and showed the therapist and discussed her doing it 3 times before I see her again Sitting pelvic floor contraction holding 10 sec 10 x with therapist finger in the vaginal canal to give tactile cues  PATIENT EDUCATION: 10/30/23 Education details: Access Code: [email or web address] Person educated: Patient Education method: Explanation, Demonstration, Actor cues, Verbal cues, and Handouts Education comprehension: verbalized understanding, returned demonstration, verbal cues required, tactile cues required, and needs further education   HOME EXERCISE PROGRAM: 10/30/23 Access Code: [email or web address] URL: [email or web address] Date:  10/30/2023 Prepared by: Eulis Foster  Program Notes do 3 days of  your yoga sequence, sun salutation  Exercises - Supine Double Knee to Chest  - 1 x daily - 7 x weekly - 1 sets - 1 reps - 1 min hold - Supine Piriformis Stretch  - 1 x daily - 7 x weekly - 1 sets - 1 reps - 30 sec hold - Supine Piriformis Stretch with Leg Straight  - 1 x daily - 7 x weekly - 1 sets - 1 reps - 30 sec hold - Seated Pelvic Floor Contraction  - 3 x daily - 7 x weekly - 1 sets - 10 reps - 10 sec hold  ASSESSMENT:  CLINICAL IMPRESSION: Patient is a 67 y.o. female who was seen today for physical therapy  treatment for fecal incontinence and urgency.  Patient is pushing on the perineum to assist with bowel movement.  Patient pelvic floro strength is 3/5 and now able to hold for 10 seconds. The vaginal tissue is softer and improved texture since she is using the Estrace cream. Patient would benefit from skilled therapy to improve pelvic floor strength and coordination to reduce leakage and improve balance to reduce the number of falls.   OBJECTIVE IMPAIRMENTS: decreased activity tolerance, decreased coordination, decreased strength, and increased fascial restrictions.   ACTIVITY LIMITATIONS: continence, toileting, and locomotion level  PARTICIPATION LIMITATIONS: community activity  PERSONAL  FACTORS: Age and 1 comorbidity: Fibromalgia; cesarean section  are also affecting patient's functional outcome.   REHAB POTENTIAL: Excellent  CLINICAL DECISION MAKING: Stable/uncomplicated  EVALUATION COMPLEXITY: Low   GOALS: Goals reviewed with patient? Yes  SHORT TERM GOALS: Target date: 11/20/23  Patient independent with initial HEP for pelvic floor and balance exercises.  Baseline: Goal status: INITIAL  2.  Patient is able to go from sit to stand </= 18 sec.  Baseline:  Goal status: INITIAL   3.  Patient educated on ways to splint the perineal area to improve the expulsion of her stool due to the posterior  wall weakness.  Baseline:  Goal status: Met 10/30/23  4.  Patient educated on the urge to void so she is able to walk slowly to the bathroom without leaking urine.  Baseline:  Goal status: INITIAL   LONG TERM GOALS: Target date: 12/18/23  Patient independent with advanced HEP for pelvic floor, core and balance exercises.  Baseline:  Goal status: INITIAL  2.  Patient reports her confidence of not falling has improved >/= 80% when walking quickly to the bathroom due to sit to stand </= 13 sec. Baseline:  Goal status: INITIAL  3.  Patient reports her urinary leakage </= 75% due to improve pelvic floor strength >/= 3/5 holding for 5 seconds vaginally.  Baseline:  Goal status: INITIAL  4.  Rectal strength >/= 4/5 holding for 40 seconds to reduce the chance of fecal leakage.  Baseline:  Goal status: INITIAL    PLAN:  PT FREQUENCY: 2x/week  PT DURATION: 8 weeks  PLANNED INTERVENTIONS: 97110-Therapeutic exercises, 97530- Therapeutic activity, 97112- Neuromuscular re-education, 97140- Manual therapy, Patient/Family education, Balance training, Dry Needling, Cryotherapy, Moist heat, and Biofeedback  PLAN FOR NEXT SESSION: manual work to the perineum and rectal working on contraction  Ortho: balance, walking speed, hips and knee strength, nustep; Patient goes to [employer] and would like to know exercises to do there for her strength  Eulis Foster, PT 10/30/23 9:20 AM'

## 2023-10-31 ENCOUNTER — Ambulatory Visit: Payer: 59 | Admitting: Physical Therapy

## 2023-10-31 ENCOUNTER — Encounter: Payer: Self-pay | Admitting: Physical Therapy

## 2023-10-31 DIAGNOSIS — R278 Other lack of coordination: Secondary | ICD-10-CM

## 2023-10-31 DIAGNOSIS — M6281 Muscle weakness (generalized): Secondary | ICD-10-CM | POA: Diagnosis not present

## 2023-10-31 NOTE — Therapy (Signed)
OUTPATIENT PHYSICAL THERAPY FEMALE PELVIC TREATMENT   Patient Name: Betty Holmes MRN: [id number] DOB:1956-07-14, 67 y.o., female Today's Date: 10/31/2023  END OF SESSION:  PT End of Session - 10/31/23 1156     Visit Number 3    Date for PT Re-Evaluation 12/18/23    Authorization Type [employer] Medicare/medicaid    Authorization - Visit Number 3    Authorization - Number of Visits 10    PT Start Time 1018    PT Stop Time 1058    PT Time Calculation (min) 40 min    Activity Tolerance Patient tolerated treatment well    Behavior During Therapy WFL for tasks assessed/performed              Past Medical History:  Diagnosis Date   Anxiety    Arthritis    At high risk for tick borne illness    had abx--test her again--she was okey---at [facility]   Depression    Fibromyalgia    Fibromyalgia    Past Surgical History:  Procedure Laterality Date   cataracts removal (bilateral) Bilateral    CESAREAN SECTION     CYST EXCISION Right 05/09/2017   Procedure: EXCISION RIGHT INGUINAL CYST;  Surgeon: Berna Bue, MD;  Location: [facility];  Service: General;  Laterality: Right;   JOINT REPLACEMENT     KNEE ARTHROSCOPY     KNEE SURGERY     OVARIAN CYST REMOVAL     Patient Active Problem List   Diagnosis Date Noted   Urinary incontinence, mixed 07/15/2023   Chronic diarrhea 10/14/2022   Obesity 10/30/2021   S/P cataract extraction 10/30/2021   Candidal intertrigo 08/22/2021   GAD (generalized anxiety disorder) 09/21/2019   Pain in left wrist 07/24/2017   Painful wrist, right 07/24/2017   Osteopenia 04/21/2017   Cervical stenosis of spine 04/21/2017   Vitamin D deficiency 04/21/2017   Recurrent major depressive disorder, in partial remission (HCC) 04/09/2017   Chronic low back pain 04/09/2017   Fibromyalgia     PCP: Anne Ng, NP  REFERRING PROVIDER: Selmer Dominion, NP   REFERRING DIAG: [phone] (ICD-10-CM) -  Incontinence of feces with fecal urgency   THERAPY DIAG:  Muscle weakness (generalized)  Other lack of coordination  Rationale for Evaluation and Treatment: Rehabilitation  ONSET DATE: 2014  SUBJECTIVE:                                                                                                                                                                                           SUBJECTIVE STATEMENT: Patient reports she is doing good today. She  is not currently having any pain. She wants to get back into doing exercising in the pool.  Fluid intake: 24oz Coffee, 24oz Sparkling water/fizzy water per day    PAIN:  Are you having pain? No  PRECAUTIONS: None  RED FLAGS: None   WEIGHT BEARING RESTRICTIONS: No  FALLS:  Has patient fallen in last 6 months? Yes. Number of falls 5 time and does not feel like she has good balance and fall forward on her knees and hands  LIVING ENVIRONMENT: Lives with: lives with their spouse  OCCUPATION: retired  PLOF: Independent  PATIENT GOALS: learn how to manage her prolapse, reduce leakage  PERTINENT HISTORY:  Fibromalgia; cesarean section  BOWEL MOVEMENT: Pain with bowel movement: No Type of bowel movement:Type (Bristol Stool Scale) Type 1, type 4 thin, type 7, Frequency 5 per day, Strain Yes, and Splinting no Fully empty rectum: No, feels like there is a big load in the rectum and not able to get it out Leakage: Yes: 2 times per month  but has not happened since she has changed her diet Fiber supplement: Yes: metamucil gummies  URINATION: Pain with urination: No Fully empty bladder: Yes: needs to urinate multiple times afterwards  especially at night Stream: Strong and urine will spray if not sitting up Urgency: Yes: sometimes she is not able to hold the urine and happens 2 times per month; happens when she gets home and key in the door Frequency: Day time voids 6.  Nocturia: 4-7 times per night to void. Will wake up 2 times  due to strong urge to urinate Leakage: Urge to void and Walking to the bathroom Pads: No  INTERCOURSE:not active  PREGNANCY: Vaginal deliveries 3, forceps with second child with large scar and became infected C-section deliveries 1   PROLAPSE: stage I/IV Anterior, I/IV Uterine, and II/IV posterior prolapse.   OBJECTIVE:  Note: Objective measures were completed at Evaluation unless otherwise noted.  DIAGNOSTIC FINDINGS:  Pelvic floor strength I/V   PVR of 4 ml was obtained by bladder scan.   COGNITION: Overall cognitive status: Within functional limits for tasks assessed       FUNCTIONAL TESTS:  5 times sit to stand: 21.80 sec Single leg stance bil. 6 sec; tandem stance 13 sec   POSTURE: rounded shoulders, forward head, and decreased lumbar lordosis  PELVIC ALIGNMENT:  LUMBARAROM/PROM: decreased by 25%   LOWER EXTREMITY ROM: bilateral hip ROM is full   LOWER EXTREMITY MMT:  MMT Right eval Left eval  Hip flexion 4/5 4/5  Hip extension 4/5 4/5  Hip abduction 3/5 3/5  Knee extension 4/5 4/5   PALPATION:   General  decreased expansion of the lower rib cage , tenderness located in lumbar                External Perineal Exam intact, restrictions along the perineal body from the scars after child birth                             Internal Pelvic Floor thickness along the left side of the pelvic floor  Patient confirms identification and approves PT to assess internal pelvic floor and treatment Yes  PELVIC MMT:   MMT eval 10/30/23  Vaginal 3/5 with weak hug of therapist finger for 4 sec 3/5 holding 10 sec  Internal Anal Sphincter 2/5   External Anal Sphincter 3/5   Puborectalis 2/5    (Blank rows = not tested)  TONE: average   TODAY'S TREATMENT:           10/31/2023 NuStep level 3 - PT present to discuss status Leg Press 80# bilateral 2 x 10 40# ; unilateral 2 x 10 Seated Marching 2.5 AW 2 x 10 each Seated LAQ 2.5 AW 2 x 10 each Sit to  Stands 2 x 10 one set with 5# one set with 8# Seated Hip abduction with yellow loop 2 x 10 Seated hamstring curls green TB 2 x10 each EC on airex x 30 sec - noted increased sway Side stepping on airex x 12 Balance obstacle course (hurdle, airex, 4 in step, 360 turn, object pick up) x 2   10/30/23 Manual: Internal pelvic floor techniques: No emotional/communication barriers or cognitive limitation. Patient is motivated to learn. Patient understands and agrees with treatment goals and plan. PT explains patient will be examined in standing, sitting, and lying down to see how their muscles and joints work. When they are ready, they will be asked to remove their underwear so PT can examine their perineum. The patient is also given the option of providing their own chaperone as one is not provided in our facility. The patient also has the right and is explained the right to defer or refuse any part of the evaluation or treatment including the internal exam. With the patient's consent, PT will use one gloved finger to gently assess the muscles of the pelvic floor, seeing how well it contracts and relaxes and if there is muscle symmetry. After, the patient will get dressed and PT and patient will discuss exam findings and plan of care. PT and patient discuss plan of care, schedule, attendance policy and HEP activities. Going through the vaginal canal working on the sides of the introitus and obturator internist to lengthen the tissue Exercises: Stretches/mobility: Double knees to chest with knees apart and diaphragmatic breathing Piriformis stretch holding 30 sec bil.  Trunk rotation with pulling knee across the body holding 30 sec bil.  Strengthening:  Yoga sequence she does at home and showed the therapist and discussed her doing it 3 times before I see her again Sitting pelvic floor contraction holding 10 sec 10 x with therapist finger in the vaginal canal to give tactile cues  PATIENT  EDUCATION: 10/30/23 Education details: Access Code: [email or web address] Person educated: Patient Education method: Explanation, Demonstration, Actor cues, Verbal cues, and Handouts Education comprehension: verbalized understanding, returned demonstration, verbal cues required, tactile cues required, and needs further education   HOME EXERCISE PROGRAM: 10/30/23 Access Code: [email or web address] URL: [email or web address] Date: 10/30/2023 Prepared by: Eulis Foster  Program Notes do 3 days of  your yoga sequence, sun salutation  Exercises - Supine Double Knee to Chest  - 1 x daily - 7 x weekly - 1 sets - 1 reps - 1 min hold - Supine Piriformis Stretch  - 1 x daily - 7 x weekly - 1 sets - 1 reps - 30 sec hold - Supine Piriformis Stretch with Leg Straight  - 1 x daily - 7 x weekly - 1 sets - 1 reps - 30 sec hold - Seated Pelvic Floor Contraction  - 3 x daily - 7 x weekly - 1 sets - 10 reps - 10 sec hold  Access Code: [email or web address] URL: [email or web address] Date: 10/31/2023 Prepared by: Claude Manges  Exercises - Seated Hip Flexion March with Ankle Weights  - 1 x daily - 7 x weekly - 2 sets - 10 reps - Seated Long Arc Quad with  Ankle Weight  - 1 x daily - 7 x weekly - 2 sets - 10 reps - Sit to Stand  - 1 x daily - 7 x weekly - 1 sets - 10 reps - Seated Hip Abduction with Resistance  - 1 x daily - 7 x weekly - 2 sets - 10 reps ASSESSMENT:  CLINICAL IMPRESSION: Today's treatment session focused on LE strengthening and balance. Educated patient on machines she can use at [employer] for LE strengthening. Updated patient's HEP to include LE strengthening exercises. Patient required verbal and visual cues for correct exercises performance. Patient tolerated treatment session well and verbalized feeling a muscle burn. Educated patient on three systems involved in balance and how we can manipulate them with different exercises. Patient verbalized understanding. She feels the most unsteady  when she is walking on grass and uneven concrete.Patient will benefit from skilled PT to address the below impairments and improve overall function.   OBJECTIVE IMPAIRMENTS: decreased activity tolerance, decreased coordination, decreased strength, and increased fascial restrictions.   ACTIVITY LIMITATIONS: continence, toileting, and locomotion level  PARTICIPATION LIMITATIONS: community activity  PERSONAL FACTORS: Age and 1 comorbidity: Fibromalgia; cesarean section  are also affecting patient's functional outcome.   REHAB POTENTIAL: Excellent  CLINICAL DECISION MAKING: Stable/uncomplicated  EVALUATION COMPLEXITY: Low   GOALS: Goals reviewed with patient? Yes  SHORT TERM GOALS: Target date: 11/20/23  Patient independent with initial HEP for pelvic floor and balance exercises.  Baseline: Goal status: INITIAL  2.  Patient is able to go from sit to stand </= 18 sec.  Baseline:  Goal status: INITIAL   3.  Patient educated on ways to splint the perineal area to improve the expulsion of her stool due to the posterior wall weakness.  Baseline:  Goal status: Met 10/30/23  4.  Patient educated on the urge to void so she is able to walk slowly to the bathroom without leaking urine.  Baseline:  Goal status: INITIAL   LONG TERM GOALS: Target date: 12/18/23  Patient independent with advanced HEP for pelvic floor, core and balance exercises.  Baseline:  Goal status: INITIAL  2.  Patient reports her confidence of not falling has improved >/= 80% when walking quickly to the bathroom due to sit to stand </= 13 sec. Baseline:  Goal status: INITIAL  3.  Patient reports her urinary leakage </= 75% due to improve pelvic floor strength >/= 3/5 holding for 5 seconds vaginally.  Baseline:  Goal status: INITIAL  4.  Rectal strength >/= 4/5 holding for 40 seconds to reduce the chance of fecal leakage.  Baseline:  Goal status: INITIAL    PLAN:  PT FREQUENCY: 2x/week  PT DURATION:  8 weeks  PLANNED INTERVENTIONS: 97110-Therapeutic exercises, 97530- Therapeutic activity, 97112- Neuromuscular re-education, 97140- Manual therapy, Patient/Family education, Balance training, Dry Needling, Cryotherapy, Moist heat, and Biofeedback  PLAN FOR NEXT SESSION: manual work to the perineum and rectal working on contraction  Ortho: assess HEP; 6 MWT; continue LE strength dynamic balance (walking with head turns, cobblestone)   Claude Manges, PT 10/31/23 11:57 AM

## 2023-11-04 ENCOUNTER — Ambulatory Visit: Payer: 59 | Admitting: Physical Therapy

## 2023-11-04 ENCOUNTER — Encounter: Payer: Self-pay | Admitting: Physical Therapy

## 2023-11-04 DIAGNOSIS — R278 Other lack of coordination: Secondary | ICD-10-CM

## 2023-11-04 DIAGNOSIS — M6281 Muscle weakness (generalized): Secondary | ICD-10-CM

## 2023-11-04 DIAGNOSIS — H8112 Benign paroxysmal vertigo, left ear: Secondary | ICD-10-CM

## 2023-11-04 NOTE — Therapy (Signed)
OUTPATIENT PHYSICAL THERAPY FEMALE PELVIC TREATMENT   Patient Name: Betty Holmes MRN: [id number] DOB:[birth year], 67 y.o., female Today's Date: 11/04/2023  END OF SESSION:  PT End of Session - 11/04/23 1322     Visit Number 4    Date for PT Re-Evaluation 12/18/23    Authorization Type [employer] Medicare/medicaid    Authorization - Visit Number 4    Authorization - Number of Visits 10    PT Start Time 1231    PT Stop Time 1310    PT Time Calculation (min) 39 min    Activity Tolerance Patient tolerated treatment well    Behavior During Therapy WFL for tasks assessed/performed               Past Medical History:  Diagnosis Date   Anxiety    Arthritis    At high risk for tick borne illness    had abx--test her again--she was okey---at [facility]   Depression    Fibromyalgia    Fibromyalgia    Past Surgical History:  Procedure Laterality Date   cataracts removal (bilateral) Bilateral    CESAREAN SECTION     CYST EXCISION Right 05/09/2017   Procedure: EXCISION RIGHT INGUINAL CYST;  Surgeon: Berna Bue, MD;  Location: [facility];  Service: General;  Laterality: Right;   JOINT REPLACEMENT     KNEE ARTHROSCOPY     KNEE SURGERY     OVARIAN CYST REMOVAL     Patient Active Problem List   Diagnosis Date Noted   Urinary incontinence, mixed 07/15/2023   Chronic diarrhea 10/14/2022   Obesity 10/30/2021   S/P cataract extraction 10/30/2021   Candidal intertrigo 08/22/2021   GAD (generalized anxiety disorder) 09/21/2019   Pain in left wrist 07/24/2017   Painful wrist, right 07/24/2017   Osteopenia 04/21/2017   Cervical stenosis of spine 04/21/2017   Vitamin D deficiency 04/21/2017   Recurrent major depressive disorder, in partial remission (HCC) 04/09/2017   Chronic low back pain 04/09/2017   Fibromyalgia     PCP: Anne Ng, NP  REFERRING PROVIDER: Selmer Dominion, NP   REFERRING DIAG: [phone] (ICD-10-CM) -  Incontinence of feces with fecal urgency   THERAPY DIAG:  Muscle weakness (generalized)  Other lack of coordination  BPPV (benign paroxysmal positional vertigo), left  Rationale for Evaluation and Treatment: Rehabilitation  ONSET DATE: 2014  SUBJECTIVE:                                                                                                                                                                                           SUBJECTIVE STATEMENT:  Patient reports she is doing good today. She has been doing her HEP exercises.  Fluid intake: 24oz Coffee, 24oz Sparkling water/fizzy water per day    PAIN:  Are you having pain? No  PRECAUTIONS: None  RED FLAGS: None   WEIGHT BEARING RESTRICTIONS: No  FALLS:  Has patient fallen in last 6 months? Yes. Number of falls 5 time and does not feel like she has good balance and fall forward on her knees and hands  LIVING ENVIRONMENT: Lives with: lives with their spouse  OCCUPATION: retired  PLOF: Independent  PATIENT GOALS: learn how to manage her prolapse, reduce leakage  PERTINENT HISTORY:  Fibromalgia; cesarean section  BOWEL MOVEMENT: Pain with bowel movement: No Type of bowel movement:Type (Bristol Stool Scale) Type 1, type 4 thin, type 7, Frequency 5 per day, Strain Yes, and Splinting no Fully empty rectum: No, feels like there is a big load in the rectum and not able to get it out Leakage: Yes: 2 times per month  but has not happened since she has changed her diet Fiber supplement: Yes: metamucil gummies  URINATION: Pain with urination: No Fully empty bladder: Yes: needs to urinate multiple times afterwards  especially at night Stream: Strong and urine will spray if not sitting up Urgency: Yes: sometimes she is not able to hold the urine and happens 2 times per month; happens when she gets home and key in the door Frequency: Day time voids 6.  Nocturia: 4-7 times per night to void. Will wake up 2 times due  to strong urge to urinate Leakage: Urge to void and Walking to the bathroom Pads: No  INTERCOURSE:not active  PREGNANCY: Vaginal deliveries 3, forceps with second child with large scar and became infected C-section deliveries 1   PROLAPSE: stage I/IV Anterior, I/IV Uterine, and II/IV posterior prolapse.   OBJECTIVE:  Note: Objective measures were completed at Evaluation unless otherwise noted.  DIAGNOSTIC FINDINGS:  Pelvic floor strength I/V   PVR of 4 ml was obtained by bladder scan.   COGNITION: Overall cognitive status: Within functional limits for tasks assessed       FUNCTIONAL TESTS:  5 times sit to stand: 21.80 sec Single leg stance bil. 6 sec; tandem stance 13 sec   POSTURE: rounded shoulders, forward head, and decreased lumbar lordosis  PELVIC ALIGNMENT:  LUMBARAROM/PROM: decreased by 25%   LOWER EXTREMITY ROM: bilateral hip ROM is full   LOWER EXTREMITY MMT:  MMT Right eval Left eval  Hip flexion 4/5 4/5  Hip extension 4/5 4/5  Hip abduction 3/5 3/5  Knee extension 4/5 4/5   PALPATION:   General  decreased expansion of the lower rib cage , tenderness located in lumbar                External Perineal Exam intact, restrictions along the perineal body from the scars after child birth                             Internal Pelvic Floor thickness along the left side of the pelvic floor  Patient confirms identification and approves PT to assess internal pelvic floor and treatment Yes  PELVIC MMT:   MMT eval 10/30/23  Vaginal 3/5 with weak hug of therapist finger for 4 sec 3/5 holding 10 sec  Internal Anal Sphincter 2/5   External Anal Sphincter 3/5   Puborectalis 2/5    (Blank rows = not tested)  TONE: average   TODAY'S TREATMENT:           11/04/2023 NuStep level 3 5 minutes - PT present to discuss status Supine LTR x 10 each direction Supine hip flexion stretch 2 x 30 sec each Seated hamstring stretch 2 x 30 sec each Supine  hamstring stretch with green strap x 30 sec each Sit to Stands x 10  8# Leg Press 80# bilateral 2 x 10 40# ; unilateral 2 x 10 Walking With head turns (up/down; Left/right)  Walking stepping over hurdles x 3 [facility] [employer] x 3 [facility] in and outs (Stepping up & back) x 2 Short arc quads over bolster 2.5# AW 3 x 10 each Supine SLR 2.5 AW 2 x 8 each   10/31/2023 NuStep level 3 - PT present to discuss status Leg Press 80# bilateral 2 x 10 40# ; unilateral 2 x 10 Seated Marching 2.5 AW 2 x 10 each Seated LAQ 2.5 AW 2 x 10 each Sit to Stands 2 x 10 one set with 5# one set with 8# Seated Hip abduction with yellow loop 2 x 10 Seated hamstring curls green TB 2 x10 each EC on airex x 30 sec - noted increased sway Side stepping on airex x 12 Balance obstacle course (hurdle, airex, 4 in step, 360 turn, object pick up) x 2   10/30/23 Manual: Internal pelvic floor techniques: No emotional/communication barriers or cognitive limitation. Patient is motivated to learn. Patient understands and agrees with treatment goals and plan. PT explains patient will be examined in standing, sitting, and lying down to see how their muscles and joints work. When they are ready, they will be asked to remove their underwear so PT can examine their perineum. The patient is also given the option of providing their own chaperone as one is not provided in our facility. The patient also has the right and is explained the right to defer or refuse any part of the evaluation or treatment including the internal exam. With the patient's consent, PT will use one gloved finger to gently assess the muscles of the pelvic floor, seeing how well it contracts and relaxes and if there is muscle symmetry. After, the patient will get dressed and PT and patient will discuss exam findings and plan of care. PT and patient discuss plan of care, schedule, attendance policy and HEP activities. Going through the vaginal canal working on the sides of the  introitus and obturator internist to lengthen the tissue Exercises: Stretches/mobility: Double knees to chest with knees apart and diaphragmatic breathing Piriformis stretch holding 30 sec bil.  Trunk rotation with pulling knee across the body holding 30 sec bil.  Strengthening:  Yoga sequence she does at home and showed the therapist and discussed her doing it 3 times before I see her again Sitting pelvic floor contraction holding 10 sec 10 x with therapist finger in the vaginal canal to give tactile cues  PATIENT EDUCATION: 10/30/23 Education details: Access Code: [email or web address] Person educated: Patient Education method: Explanation, Demonstration, Actor cues, Verbal cues, and Handouts Education comprehension: verbalized understanding, returned demonstration, verbal cues required, tactile cues required, and needs further education   HOME EXERCISE PROGRAM: 10/30/23 Access Code: [email or web address] URL: [email or web address] Date: 10/30/2023 Prepared by: Eulis Foster  Program Notes do 3 days of  your yoga sequence, sun salutation  Exercises - Supine Double Knee to Chest  - 1 x daily - 7 x weekly - 1 sets - 1 reps - 1 min hold - Supine Piriformis  Stretch  - 1 x daily - 7 x weekly - 1 sets - 1 reps - 30 sec hold - Supine Piriformis Stretch with Leg Straight  - 1 x daily - 7 x weekly - 1 sets - 1 reps - 30 sec hold - Seated Pelvic Floor Contraction  - 3 x daily - 7 x weekly - 1 sets - 10 reps - 10 sec hold  Access Code: [email or web address] URL: [email or web address] Date: 11/04/2023 Prepared by: Claude Manges  Exercises - Seated Hip Flexion March with Ankle Weights  - 1 x daily - 7 x weekly - 2 sets - 10 reps - Seated Long Arc Quad with Ankle Weight  - 1 x daily - 7 x weekly - 2 sets - 10 reps - Sit to Stand  - 1 x daily - 7 x weekly - 1 sets - 10 reps - Seated Hip Abduction with Resistance  - 1 x daily - 7 x weekly - 2 sets - 10 reps - Supine Lower Trunk Rotation  - 1 x daily -  7 x weekly - 1 sets - 10 reps - 5 hold - Seated Hamstring Stretch  - 1 x daily - 7 x weekly - 2 sets - 30 hold - Hip Flexion Stretch  - 1 x daily - 7 x weekly - 2 sets - 20 hold - Supine Hamstring Stretch with Strap  - 1 x daily - 7 x weekly - 2 sets - 30 hold ASSESSMENT:  CLINICAL IMPRESSION: Today's treatment session focused on LE strengthening and balance. Patient verbalized being compliant with HEP exercises. She verbalized feeling stiff today so incorporated lumbar and hip mobility exercises. Updated patient's HEP to included these exercises. Patient required verbal and tactile cues for correct exercise performance. Educated patient on benefit of strengthening LEs for improved balance. Patient did not experience any losses of balance during activities. Based on balance examination patient requires quadriceps strengthening for improved knee stability.  Patient will benefit from skilled PT to address the below impairments and improve overall function.    OBJECTIVE IMPAIRMENTS: decreased activity tolerance, decreased coordination, decreased strength, and increased fascial restrictions.   ACTIVITY LIMITATIONS: continence, toileting, and locomotion level  PARTICIPATION LIMITATIONS: community activity  PERSONAL FACTORS: Age and 1 comorbidity: Fibromalgia; cesarean section  are also affecting patient's functional outcome.   REHAB POTENTIAL: Excellent  CLINICAL DECISION MAKING: Stable/uncomplicated  EVALUATION COMPLEXITY: Low   GOALS: Goals reviewed with patient? Yes  SHORT TERM GOALS: Target date: 11/20/23  Patient independent with initial HEP for pelvic floor and balance exercises.  Baseline: Goal status: INITIAL  2.  Patient is able to go from sit to stand </= 18 sec.  Baseline:  Goal status: INITIAL   3.  Patient educated on ways to splint the perineal area to improve the expulsion of her stool due to the posterior wall weakness.  Baseline:  Goal status: Met 10/30/23  4.   Patient educated on the urge to void so she is able to walk slowly to the bathroom without leaking urine.  Baseline:  Goal status: INITIAL   LONG TERM GOALS: Target date: 12/18/23  Patient independent with advanced HEP for pelvic floor, core and balance exercises.  Baseline:  Goal status: INITIAL  2.  Patient reports her confidence of not falling has improved >/= 80% when walking quickly to the bathroom due to sit to stand </= 13 sec. Baseline:  Goal status: INITIAL  3.  Patient reports her urinary leakage </= 75% due to improve  pelvic floor strength >/= 3/5 holding for 5 seconds vaginally.  Baseline:  Goal status: INITIAL  4.  Rectal strength >/= 4/5 holding for 40 seconds to reduce the chance of fecal leakage.  Baseline:  Goal status: INITIAL    PLAN:  PT FREQUENCY: 2x/week  PT DURATION: 8 weeks  PLANNED INTERVENTIONS: 97110-Therapeutic exercises, 97530- Therapeutic activity, 97112- Neuromuscular re-education, 97140- Manual therapy, Patient/Family education, Balance training, Dry Needling, Cryotherapy, Moist heat, and Biofeedback  PLAN FOR NEXT SESSION: manual work to the perineum and rectal working on contraction  Ortho: 6 MWT; continue LE strengthening specifically quads   Claude Manges, PT 11/04/23 1:23 PM

## 2023-11-06 ENCOUNTER — Telehealth: Payer: Self-pay

## 2023-11-06 ENCOUNTER — Encounter: Payer: Self-pay | Admitting: Physical Therapy

## 2023-11-06 ENCOUNTER — Encounter: Payer: 59 | Admitting: Physical Therapy

## 2023-11-06 DIAGNOSIS — R278 Other lack of coordination: Secondary | ICD-10-CM | POA: Diagnosis not present

## 2023-11-06 DIAGNOSIS — M6281 Muscle weakness (generalized): Secondary | ICD-10-CM

## 2023-11-06 NOTE — Therapy (Signed)
OUTPATIENT PHYSICAL THERAPY FEMALE PELVIC TREATMENT   Patient Name: Betty Holmes MRN: [id number] DOB:22-Feb-1956, 67 y.o., female Today's Date: 11/06/2023  END OF SESSION:  PT End of Session - 11/06/23 0836     Visit Number 5    Date for PT Re-Evaluation 12/18/23    Authorization - Visit Number 5    Authorization - Number of Visits 10    PT Start Time 0830    PT Stop Time 0915    PT Time Calculation (min) 45 min    Activity Tolerance Patient tolerated treatment well    Behavior During Therapy WFL for tasks assessed/performed               Past Medical History:  Diagnosis Date   Anxiety    Arthritis    At high risk for tick borne illness    had abx--test her again--she was okey---at [facility]   Depression    Fibromyalgia    Fibromyalgia    Past Surgical History:  Procedure Laterality Date   cataracts removal (bilateral) Bilateral    CESAREAN SECTION     CYST EXCISION Right 05/09/2017   Procedure: EXCISION RIGHT INGUINAL CYST;  Surgeon: Berna Bue, MD;  Location: [facility];  Service: General;  Laterality: Right;   JOINT REPLACEMENT     KNEE ARTHROSCOPY     KNEE SURGERY     OVARIAN CYST REMOVAL     Patient Active Problem List   Diagnosis Date Noted   Urinary incontinence, mixed 07/15/2023   Chronic diarrhea 10/14/2022   Obesity 10/30/2021   S/P cataract extraction 10/30/2021   Candidal intertrigo 08/22/2021   GAD (generalized anxiety disorder) 09/21/2019   Pain in left wrist 07/24/2017   Painful wrist, right 07/24/2017   Osteopenia 04/21/2017   Cervical stenosis of spine 04/21/2017   Vitamin D deficiency 04/21/2017   Recurrent major depressive disorder, in partial remission (HCC) 04/09/2017   Chronic low back pain 04/09/2017   Fibromyalgia     PCP: Anne Ng, NP  REFERRING PROVIDER: Selmer Dominion, NP   REFERRING DIAG: [phone] (ICD-10-CM) - Incontinence of feces with fecal urgency    THERAPY DIAG:  Muscle weakness (generalized)  Other lack of coordination  Rationale for Evaluation and Treatment: Rehabilitation  ONSET DATE: 2014  SUBJECTIVE:                                                                                                                                                                                           SUBJECTIVE STATEMENT: I am leaving for [place] on 12/7 then travel out of the country. The  pessary is not bothering me now. I have not had any urinary leakage since last visit.    Fluid intake: 24oz Coffee, 24oz Sparkling water/fizzy water per day    PAIN:  Are you having pain? No  PRECAUTIONS: None  RED FLAGS: None   WEIGHT BEARING RESTRICTIONS: No  FALLS:  Has patient fallen in last 6 months? Yes. Number of falls 5 time and does not feel like she has good balance and fall forward on her knees and hands  LIVING ENVIRONMENT: Lives with: lives with their spouse  OCCUPATION: retired  PLOF: Independent  PATIENT GOALS: learn how to manage her prolapse, reduce leakage  PERTINENT HISTORY:  Fibromalgia; cesarean section  BOWEL MOVEMENT: Pain with bowel movement: No Type of bowel movement:Type (Bristol Stool Scale) Type 1, type 4 thin, type 7, Frequency 5 per day, Strain Yes, and Splinting no Fully empty rectum: No, feels like there is a big load in the rectum and not able to get it out Leakage: Yes: 2 times per month  but has not happened since she has changed her diet Fiber supplement: Yes: metamucil gummies  URINATION: Pain with urination: No Fully empty bladder: Yes: needs to urinate multiple times afterwards  especially at night Stream: Strong and urine will spray if not sitting up Urgency: Yes: sometimes she is not able to hold the urine and happens 2 times per month; happens when she gets home and key in the door Frequency: Day time voids 6.  Nocturia: 4-7 times per night to void. Will wake up 2 times due to strong urge to  urinate Leakage: Urge to void and Walking to the bathroom Pads: No  INTERCOURSE:not active  PREGNANCY: Vaginal deliveries 3, forceps with second child with large scar and became infected C-section deliveries 1   PROLAPSE: stage I/IV Anterior, I/IV Uterine, and II/IV posterior prolapse.   OBJECTIVE:  Note: Objective measures were completed at Evaluation unless otherwise noted.  DIAGNOSTIC FINDINGS:  Pelvic floor strength I/V   PVR of 4 ml was obtained by bladder scan.   COGNITION: Overall cognitive status: Within functional limits for tasks assessed       FUNCTIONAL TESTS:  5 times sit to stand: 21.80 sec Single leg stance bil. 6 sec; tandem stance 13 sec   POSTURE: rounded shoulders, forward head, and decreased lumbar lordosis  PELVIC ALIGNMENT:  LUMBARAROM/PROM: decreased by 25%   LOWER EXTREMITY ROM: bilateral hip ROM is full   LOWER EXTREMITY MMT:  MMT Right eval Left eval  Hip flexion 4/5 4/5  Hip extension 4/5 4/5  Hip abduction 3/5 3/5  Knee extension 4/5 4/5   PALPATION:   General  decreased expansion of the lower rib cage , tenderness located in lumbar                External Perineal Exam intact, restrictions along the perineal body from the scars after child birth                             Internal Pelvic Floor thickness along the left side of the pelvic floor  Patient confirms identification and approves PT to assess internal pelvic floor and treatment Yes  PELVIC MMT:   MMT eval 10/30/23 11/06/23  Vaginal 3/5 with weak hug of therapist finger for 4 sec 3/5 holding 10 sec   Internal Anal Sphincter 2/5  3/5 for 12 seconds  External Anal Sphincter 3/5  3/5 for 12 seconds  Puborectalis 2/5   3/5 for 12 seconds  (Blank rows = not tested)        TONE: average   TODAY'S TREATMENT:    11/06/23 Manual: Soft tissue mobilization: Manual work to the perineal body to improve mobility Internal pelvic floor techniques: No  emotional/communication barriers or cognitive limitation. Patient is motivated to learn. Patient understands and agrees with treatment goals and plan. PT explains patient will be examined in standing, sitting, and lying down to see how their muscles and joints work. When they are ready, they will be asked to remove their underwear so PT can examine their perineum. The patient is also given the option of providing their own chaperone as one is not provided in our facility. The patient also has the right and is explained the right to defer or refuse any part of the evaluation or treatment including the internal exam. With the patient's consent, PT will use one gloved finger to gently assess the muscles of the pelvic floor, seeing how well it contracts and relaxes and if there is muscle symmetry. After, the patient will get dressed and PT and patient will discuss exam findings and plan of care. PT and patient discuss plan of care, schedule, attendance policy and HEP activities.  Going through the rectum performing manual work to the puborectalis, levator ani to elongate Neuromuscular re-education: Pelvic floor contraction training: Therapist finger in the rectum working on pelvic floor contraction with forward movement of the puborectalis and upward movement of the sphincters holding for 12 seconds 15 times Seated marching with red band around knees and elbow flexion 10 x 3 with pelvic floor contraction Exercises: Strengthening: Sitting hip abduction with red band, pelvic floor contraction and holding 1 pound in each hand for 10 reps; for a total of 5 times Sitting marching with red band and elbow flexion with 1 pound in each hand 10 x 3 with pelvic floor contraction Sitting knee extension with punchout of arm holding 1 pound in each hand and red band around knees 10 x 3            11/04/2023 NuStep level 3 5 minutes - PT present to discuss status Supine LTR x 10 each direction Supine hip flexion  stretch 2 x 30 sec each Seated hamstring stretch 2 x 30 sec each Supine hamstring stretch with green strap x 30 sec each Sit to Stands x 10  8# Leg Press 80# bilateral 2 x 10 40# ; unilateral 2 x 10 Walking With head turns (up/down; Left/right)  Walking stepping over hurdles x 3 [employer] [employer] x 3 [facility] in and outs (Stepping up & back) x 2 Short arc quads over bolster 2.5# AW 3 x 10 each Supine SLR 2.5 AW 2 x 8 each   10/31/2023 NuStep level 3 - PT present to discuss status Leg Press 80# bilateral 2 x 10 40# ; unilateral 2 x 10 Seated Marching 2.5 AW 2 x 10 each Seated LAQ 2.5 AW 2 x 10 each Sit to Stands 2 x 10 one set with 5# one set with 8# Seated Hip abduction with yellow loop 2 x 10 Seated hamstring curls green TB 2 x10 each EC on airex x 30 sec - noted increased sway Side stepping on airex x 12 Balance obstacle course (hurdle, airex, 4 in step, 360 turn, object pick up) x 2    PATIENT EDUCATION: 11/06/23 Education details: Access Code: [email or web address] Person educated: Patient Education method: Explanation, Demonstration, Tactile cues, Verbal cues, and  Handouts Education comprehension: verbalized understanding, returned demonstration, verbal cues required, tactile cues required, and needs further education   HOME EXERCISE PROGRAM: 11/06/23 Access Code: [email or web address] URL: [email or web address] Date: 11/06/2023 Prepared by: Eulis Foster  Program Notes do 3 days of  your yoga sequence, sun salutation  Exercises - Supine Double Knee to Chest  - 1 x daily - 7 x weekly - 1 sets - 1 reps - 1 min hold - Supine Piriformis Stretch  - 1 x daily - 7 x weekly - 1 sets - 1 reps - 30 sec hold - Supine Piriformis Stretch with Leg Straight  - 1 x daily - 7 x weekly - 1 sets - 1 reps - 30 sec hold - Seated Pelvic Floor Contraction  - 3 x daily - 7 x weekly - 1 sets - 10 reps - 10 sec hold - Seated March with Resistance  - 1 x daily - 3 x weekly - 3 sets - 10 reps - Seated Pelvic  Floor Contraction with Hip Abduction and Resistance Loop  - 1 x daily - 3 x weekly - 3 sets - 10 reps - Seated Knee Extension with Resistance  - 1 x daily - 3 x weekly - 3 sets - 10 reps  Access Code: [email or web address] URL: [email or web address] Date: 11/04/2023 Prepared by: Claude Manges  Exercises - Seated Hip Flexion March with Ankle Weights  - 1 x daily - 7 x weekly - 2 sets - 10 reps - Seated Long Arc Quad with Ankle Weight  - 1 x daily - 7 x weekly - 2 sets - 10 reps - Sit to Stand  - 1 x daily - 7 x weekly - 1 sets - 10 reps - Seated Hip Abduction with Resistance  - 1 x daily - 7 x weekly - 2 sets - 10 reps - Supine Lower Trunk Rotation  - 1 x daily - 7 x weekly - 1 sets - 10 reps - 5 hold - Seated Hamstring Stretch  - 1 x daily - 7 x weekly - 2 sets - 30 hold - Hip Flexion Stretch  - 1 x daily - 7 x weekly - 2 sets - 20 hold - Supine Hamstring Stretch with Strap  - 1 x daily - 7 x weekly - 2 sets - 30 hold ASSESSMENT:  CLINICAL IMPRESSION: Today's treatment session focused on pelvic floor coordination and strength. Rectal strength increased to 3/5 and she is able to hold for 12 seconds. Goal is for her to cotnract for 40 seconds for continence.  She is doing her exercises. She has not had urinary leakage since last visit. She has had one episode of fecal leakage.  Patient will benefit from skilled PT to address the below impairments and improve overall function.    OBJECTIVE IMPAIRMENTS: decreased activity tolerance, decreased coordination, decreased strength, and increased fascial restrictions.   ACTIVITY LIMITATIONS: continence, toileting, and locomotion level  PARTICIPATION LIMITATIONS: community activity  PERSONAL FACTORS: Age and 1 comorbidity: Fibromalgia; cesarean section  are also affecting patient's functional outcome.   REHAB POTENTIAL: Excellent  CLINICAL DECISION MAKING: Stable/uncomplicated  EVALUATION COMPLEXITY: Low   GOALS: Goals reviewed with  patient? Yes  SHORT TERM GOALS: Target date: 11/20/23  Patient independent with initial HEP for pelvic floor and balance exercises.  Baseline: Goal status: Met 11/06/23  2.  Patient is able to go from sit to stand </= 18 sec.  Baseline:  Goal status: INITIAL   3.  Patient educated on ways to  splint the perineal area to improve the expulsion of her stool due to the posterior wall weakness.  Baseline:  Goal status: Met 10/30/23  4.  Patient educated on the urge to void so she is able to walk slowly to the bathroom without leaking urine.  Baseline:  Goal status: INITIAL   LONG TERM GOALS: Target date: 12/18/23  Patient independent with advanced HEP for pelvic floor, core and balance exercises.  Baseline:  Goal status: INITIAL  2.  Patient reports her confidence of not falling has improved >/= 80% when walking quickly to the bathroom due to sit to stand </= 13 sec. Baseline:  Goal status: INITIAL  3.  Patient reports her urinary leakage </= 75% due to improve pelvic floor strength >/= 3/5 holding for 5 seconds vaginally.  Baseline:  Goal status: INITIAL  4.  Rectal strength >/= 4/5 holding for 40 seconds to reduce the chance of fecal leakage.  Baseline:  Goal status: INITIAL    PLAN:  PT FREQUENCY: 2x/week  PT DURATION: 8 weeks  PLANNED INTERVENTIONS: 97110-Therapeutic exercises, 97530- Therapeutic activity, 97112- Neuromuscular re-education, 97140- Manual therapy, Patient/Family education, Balance training, Dry Needling, Cryotherapy, Moist heat, and Biofeedback  PLAN FOR NEXT SESSION: urge to void; pelvic floor strengthening  Ortho: 6 MWT; continue LE strengthening specifically quads , test sit to stand time  Eulis Foster, PT 11/06/23 9:29 AM

## 2023-11-06 NOTE — Patient Outreach (Signed)
Successful call to patient on today regarding preventative mammogram screening. Patient is scheduled for November 10, 2023 at 9:10 a.m. at [facility].  Baruch Gouty [facility] Assistant [facility] Population [facility] [phone]

## 2023-11-10 ENCOUNTER — Ambulatory Visit
Admission: RE | Admit: 2023-11-10 | Discharge: 2023-11-10 | Disposition: A | Discharge status: Home / Self Care | Payer: 59 | Source: Ambulatory Visit | Attending: Nurse Practitioner

## 2023-11-10 DIAGNOSIS — Z1231 Encounter for screening mammogram for malignant neoplasm of breast: Secondary | ICD-10-CM | POA: Diagnosis not present

## 2023-11-11 ENCOUNTER — Encounter: Payer: Self-pay | Admitting: Physical Therapy

## 2023-11-11 ENCOUNTER — Ambulatory Visit: Payer: 59 | Admitting: Physical Therapy

## 2023-11-11 DIAGNOSIS — R278 Other lack of coordination: Secondary | ICD-10-CM | POA: Diagnosis not present

## 2023-11-11 DIAGNOSIS — M6281 Muscle weakness (generalized): Secondary | ICD-10-CM | POA: Diagnosis not present

## 2023-11-11 NOTE — Therapy (Signed)
OUTPATIENT PHYSICAL THERAPY FEMALE PELVIC TREATMENT   Patient Name: Betty Holmes MRN: [id number] DOB:10-13-1956, 67 y.o., female Today's Date: 11/11/2023  END OF SESSION:  PT End of Session - 11/11/23 1101     Visit Number 6    Date for PT Re-Evaluation 12/18/23    Authorization Type [employer] Medicare/medicaid    Authorization - Visit Number 6    Authorization - Number of Visits 10    PT Start Time 1031   Patient was late to appointment   PT Stop Time 1100    PT Time Calculation (min) 29 min    Activity Tolerance Patient tolerated treatment well    Behavior During Therapy WFL for tasks assessed/performed                Past Medical History:  Diagnosis Date   Anxiety    Arthritis    At high risk for tick borne illness    had abx--test her again--she was okey---at [facility]   Depression    Fibromyalgia    Fibromyalgia    Past Surgical History:  Procedure Laterality Date   cataracts removal (bilateral) Bilateral    CESAREAN SECTION     CYST EXCISION Right 05/09/2017   Procedure: EXCISION RIGHT INGUINAL CYST;  Surgeon: Berna Bue, MD;  Location: [facility];  Service: General;  Laterality: Right;   JOINT REPLACEMENT     KNEE ARTHROSCOPY     KNEE SURGERY     OVARIAN CYST REMOVAL     Patient Active Problem List   Diagnosis Date Noted   Urinary incontinence, mixed 07/15/2023   Chronic diarrhea 10/14/2022   Obesity 10/30/2021   S/P cataract extraction 10/30/2021   Candidal intertrigo 08/22/2021   GAD (generalized anxiety disorder) 09/21/2019   Pain in left wrist 07/24/2017   Painful wrist, right 07/24/2017   Osteopenia 04/21/2017   Cervical stenosis of spine 04/21/2017   Vitamin D deficiency 04/21/2017   Recurrent major depressive disorder, in partial remission (HCC) 04/09/2017   Chronic low back pain 04/09/2017   Fibromyalgia     PCP: Anne Ng, NP  REFERRING PROVIDER: Selmer Dominion, NP   REFERRING  DIAG: [phone] (ICD-10-CM) - Incontinence of feces with fecal urgency   THERAPY DIAG:  Muscle weakness (generalized)  Other lack of coordination  Rationale for Evaluation and Treatment: Rehabilitation  ONSET DATE: 2014  SUBJECTIVE:                                                                                                                                                                                           SUBJECTIVE STATEMENT:  I am leaving for [place] on 12/7 then travel out of the country. The pessary is not bothering me now. I have not had any urinary leakage since last visit.    Fluid intake: 24oz Coffee, 24oz Sparkling water/fizzy water per day    PAIN:  Are you having pain? No  PRECAUTIONS: None  RED FLAGS: None   WEIGHT BEARING RESTRICTIONS: No  FALLS:  Has patient fallen in last 6 months? Yes. Number of falls 5 time and does not feel like she has good balance and fall forward on her knees and hands  LIVING ENVIRONMENT: Lives with: lives with their spouse  OCCUPATION: retired  PLOF: Independent  PATIENT GOALS: learn how to manage her prolapse, reduce leakage  PERTINENT HISTORY:  Fibromalgia; cesarean section  BOWEL MOVEMENT: Pain with bowel movement: No Type of bowel movement:Type (Bristol Stool Scale) Type 1, type 4 thin, type 7, Frequency 5 per day, Strain Yes, and Splinting no Fully empty rectum: No, feels like there is a big load in the rectum and not able to get it out Leakage: Yes: 2 times per month  but has not happened since she has changed her diet Fiber supplement: Yes: metamucil gummies  URINATION: Pain with urination: No Fully empty bladder: Yes: needs to urinate multiple times afterwards  especially at night Stream: Strong and urine will spray if not sitting up Urgency: Yes: sometimes she is not able to hold the urine and happens 2 times per month; happens when she gets home and key in the door Frequency: Day time voids 6.  Nocturia:  4-7 times per night to void. Will wake up 2 times due to strong urge to urinate Leakage: Urge to void and Walking to the bathroom Pads: No  INTERCOURSE:not active  PREGNANCY: Vaginal deliveries 3, forceps with second child with large scar and became infected C-section deliveries 1   PROLAPSE: stage I/IV Anterior, I/IV Uterine, and II/IV posterior prolapse.   OBJECTIVE:  Note: Objective measures were completed at Evaluation unless otherwise noted.  DIAGNOSTIC FINDINGS:  Pelvic floor strength I/V   PVR of 4 ml was obtained by bladder scan.   COGNITION: Overall cognitive status: Within functional limits for tasks assessed       FUNCTIONAL TESTS:  5 times sit to stand: 21.80 sec Single leg stance bil. 6 sec; tandem stance 13 sec   POSTURE: rounded shoulders, forward head, and decreased lumbar lordosis  PELVIC ALIGNMENT:  LUMBARAROM/PROM: decreased by 25%   LOWER EXTREMITY ROM: bilateral hip ROM is full   LOWER EXTREMITY MMT:  MMT Right eval Left eval  Hip flexion 4/5 4/5  Hip extension 4/5 4/5  Hip abduction 3/5 3/5  Knee extension 4/5 4/5   PALPATION:   General  decreased expansion of the lower rib cage , tenderness located in lumbar                External Perineal Exam intact, restrictions along the perineal body from the scars after child birth                             Internal Pelvic Floor thickness along the left side of the pelvic floor  Patient confirms identification and approves PT to assess internal pelvic floor and treatment Yes  PELVIC MMT:   MMT eval 10/30/23 11/06/23  Vaginal 3/5 with weak hug of therapist finger for 4 sec 3/5 holding 10 sec   Internal Anal Sphincter 2/5  3/5  for 12 seconds  External Anal Sphincter 3/5  3/5 for 12 seconds  Puborectalis 2/5   3/5 for 12 seconds  (Blank rows = not tested)        TONE: average   TODAY'S TREATMENT:    11/11/2023 NuStep level 3 5 minutes - PT present to discuss status Leg Press 90#  bilateral 2 x 10 45# ; unilateral 2 x 10 Supine SLR 2.5 AW 2 x 8 each Supine SLR 2.5 AW 2 x 8 each Bridging 2 x 10 Sidelying Hip abduction with 2.5# AW+ ball press 2 x 8 Sit to Stands 2 x 8  8# DB  11/06/23 Manual: Soft tissue mobilization: Manual work to the perineal body to improve mobility Internal pelvic floor techniques: No emotional/communication barriers or cognitive limitation. Patient is motivated to learn. Patient understands and agrees with treatment goals and plan. PT explains patient will be examined in standing, sitting, and lying down to see how their muscles and joints work. When they are ready, they will be asked to remove their underwear so PT can examine their perineum. The patient is also given the option of providing their own chaperone as one is not provided in our facility. The patient also has the right and is explained the right to defer or refuse any part of the evaluation or treatment including the internal exam. With the patient's consent, PT will use one gloved finger to gently assess the muscles of the pelvic floor, seeing how well it contracts and relaxes and if there is muscle symmetry. After, the patient will get dressed and PT and patient will discuss exam findings and plan of care. PT and patient discuss plan of care, schedule, attendance policy and HEP activities.  Going through the rectum performing manual work to the puborectalis, levator ani to elongate Neuromuscular re-education: Pelvic floor contraction training: Therapist finger in the rectum working on pelvic floor contraction with forward movement of the puborectalis and upward movement of the sphincters holding for 12 seconds 15 times Seated marching with red band around knees and elbow flexion 10 x 3 with pelvic floor contraction Exercises: Strengthening: Sitting hip abduction with red band, pelvic floor contraction and holding 1 pound in each hand for 10 reps; for a total of 5 times Sitting marching  with red band and elbow flexion with 1 pound in each hand 10 x 3 with pelvic floor contraction Sitting knee extension with punchout of arm holding 1 pound in each hand and red band around knees 10 x 3            11/04/2023 NuStep level 3 5 minutes - PT present to discuss status Supine LTR x 10 each direction Supine hip flexion stretch 2 x 30 sec each Seated hamstring stretch 2 x 30 sec each Supine hamstring stretch with green strap x 30 sec each Sit to Stands x 10  8# Leg Press 80# bilateral 2 x 10 40# ; unilateral 2 x 10 Walking With head turns (up/down; Left/right)  Walking stepping over hurdles x 3 [employer] x 3 [facility] in and outs (Stepping up & back) x 2 Short arc quads over bolster 2.5# AW 3 x 10 each Supine SLR 2.5 AW 2 x 8 each    PATIENT EDUCATION: 11/06/23 Education details: Access Code: [email or web address] Person educated: Patient Education method: Programmer, multimedia, Demonstration, Actor cues, Verbal cues, and Handouts Education comprehension: verbalized understanding, returned demonstration, verbal cues required, tactile cues required, and needs further education   HOME EXERCISE PROGRAM: 11/06/23  Access Code: [email or web address] URL: [email or web address] Date: 11/06/2023 Prepared by: Eulis Foster  Program Notes do 3 days of  your yoga sequence, sun salutation  Exercises - Supine Double Knee to Chest  - 1 x daily - 7 x weekly - 1 sets - 1 reps - 1 min hold - Supine Piriformis Stretch  - 1 x daily - 7 x weekly - 1 sets - 1 reps - 30 sec hold - Supine Piriformis Stretch with Leg Straight  - 1 x daily - 7 x weekly - 1 sets - 1 reps - 30 sec hold - Seated Pelvic Floor Contraction  - 3 x daily - 7 x weekly - 1 sets - 10 reps - 10 sec hold - Seated March with Resistance  - 1 x daily - 3 x weekly - 3 sets - 10 reps - Seated Pelvic Floor Contraction with Hip Abduction and Resistance Loop  - 1 x daily - 3 x weekly - 3 sets - 10 reps - Seated Knee Extension with Resistance  - 1 x  daily - 3 x weekly - 3 sets - 10 reps  Access Code: [email or web address] URL: [email or web address] Date: 11/04/2023 Prepared by: Claude Manges  Exercises - Seated Hip Flexion March with Ankle Weights  - 1 x daily - 7 x weekly - 2 sets - 10 reps - Seated Long Arc Quad with Ankle Weight  - 1 x daily - 7 x weekly - 2 sets - 10 reps - Sit to Stand  - 1 x daily - 7 x weekly - 1 sets - 10 reps - Seated Hip Abduction with Resistance  - 1 x daily - 7 x weekly - 2 sets - 10 reps - Supine Lower Trunk Rotation  - 1 x daily - 7 x weekly - 1 sets - 10 reps - 5 hold - Seated Hamstring Stretch  - 1 x daily - 7 x weekly - 2 sets - 30 hold - Hip Flexion Stretch  - 1 x daily - 7 x weekly - 2 sets - 20 hold - Supine Hamstring Stretch with Strap  - 1 x daily - 7 x weekly - 2 sets - 30 hold ASSESSMENT:  CLINICAL IMPRESSION: Today's treatment session focused on LE strengthening. Patient started back going to the gym and her routine has been going well. She is going on multiple trips within the next few months and she would like to focus on stair training and getting up from the floor. Supine SLR remains to be a good challenge for patient. Patient required verbal and tactile cues for correct exercise performance. Patient will benefit from skilled PT to address the below impairments and improve overall function.    OBJECTIVE IMPAIRMENTS: decreased activity tolerance, decreased coordination, decreased strength, and increased fascial restrictions.   ACTIVITY LIMITATIONS: continence, toileting, and locomotion level  PARTICIPATION LIMITATIONS: community activity  PERSONAL FACTORS: Age and 1 comorbidity: Fibromalgia; cesarean section  are also affecting patient's functional outcome.   REHAB POTENTIAL: Excellent  CLINICAL DECISION MAKING: Stable/uncomplicated  EVALUATION COMPLEXITY: Low   GOALS: Goals reviewed with patient? Yes  SHORT TERM GOALS: Target date: 11/20/23  Patient independent with initial  HEP for pelvic floor and balance exercises.  Baseline: Goal status: Met 11/06/23  2.  Patient is able to go from sit to stand </= 18 sec.  Baseline:  Goal status: INITIAL   3.  Patient educated on ways to splint the perineal area to improve the expulsion of her stool due to the posterior  wall weakness.  Baseline:  Goal status: Met 10/30/23  4.  Patient educated on the urge to void so she is able to walk slowly to the bathroom without leaking urine.  Baseline:  Goal status: INITIAL   LONG TERM GOALS: Target date: 12/18/23  Patient independent with advanced HEP for pelvic floor, core and balance exercises.  Baseline:  Goal status: INITIAL  2.  Patient reports her confidence of not falling has improved >/= 80% when walking quickly to the bathroom due to sit to stand </= 13 sec. Baseline:  Goal status: INITIAL  3.  Patient reports her urinary leakage </= 75% due to improve pelvic floor strength >/= 3/5 holding for 5 seconds vaginally.  Baseline:  Goal status: INITIAL  4.  Rectal strength >/= 4/5 holding for 40 seconds to reduce the chance of fecal leakage.  Baseline:  Goal status: INITIAL    PLAN:  PT FREQUENCY: 2x/week  PT DURATION: 8 weeks  PLANNED INTERVENTIONS: 97110-Therapeutic exercises, 97530- Therapeutic activity, 97112- Neuromuscular re-education, 97140- Manual therapy, Patient/Family education, Balance training, Dry Needling, Cryotherapy, Moist heat, and Biofeedback  PLAN FOR NEXT SESSION: urge to void; pelvic floor strengthening  Ortho: 6 MWT; continue LE strengthening specifically quads , floor transfers  Claude Manges, PT 11/11/23 11:02 AM

## 2023-11-13 ENCOUNTER — Encounter: Payer: 59 | Admitting: Physical Therapy

## 2023-11-13 ENCOUNTER — Encounter: Payer: Self-pay | Admitting: Physical Therapy

## 2023-11-13 DIAGNOSIS — M6281 Muscle weakness (generalized): Secondary | ICD-10-CM | POA: Diagnosis not present

## 2023-11-13 DIAGNOSIS — H8112 Benign paroxysmal vertigo, left ear: Secondary | ICD-10-CM

## 2023-11-13 DIAGNOSIS — R278 Other lack of coordination: Secondary | ICD-10-CM | POA: Diagnosis not present

## 2023-11-13 NOTE — Therapy (Signed)
OUTPATIENT PHYSICAL THERAPY FEMALE PELVIC TREATMENT   Patient Name: Betty Holmes MRN: [id number] DOB:Apr 28, 1956, 67 y.o., female Today's Date: 11/13/2023  END OF SESSION:  PT End of Session - 11/13/23 1034     Visit Number 7    Date for PT Re-Evaluation 12/18/23    Authorization Type [employer] Medicare/medicaid    Authorization - Visit Number 7    Authorization - Number of Visits 10    PT Start Time 1030    PT Stop Time 1115    PT Time Calculation (min) 45 min    Activity Tolerance Patient tolerated treatment well    Behavior During Therapy WFL for tasks assessed/performed                Past Medical History:  Diagnosis Date   Anxiety    Arthritis    At high risk for tick borne illness    had abx--test her again--she was okey---at [facility]   Depression    Fibromyalgia    Fibromyalgia    Past Surgical History:  Procedure Laterality Date   cataracts removal (bilateral) Bilateral    CESAREAN SECTION     CYST EXCISION Right 05/09/2017   Procedure: EXCISION RIGHT INGUINAL CYST;  Surgeon: Berna Bue, MD;  Location: [facility];  Service: General;  Laterality: Right;   JOINT REPLACEMENT     KNEE ARTHROSCOPY     KNEE SURGERY     OVARIAN CYST REMOVAL     Patient Active Problem List   Diagnosis Date Noted   Urinary incontinence, mixed 07/15/2023   Chronic diarrhea 10/14/2022   Obesity 10/30/2021   S/P cataract extraction 10/30/2021   Candidal intertrigo 08/22/2021   GAD (generalized anxiety disorder) 09/21/2019   Pain in left wrist 07/24/2017   Painful wrist, right 07/24/2017   Osteopenia 04/21/2017   Cervical stenosis of spine 04/21/2017   Vitamin D deficiency 04/21/2017   Recurrent major depressive disorder, in partial remission (HCC) 04/09/2017   Chronic low back pain 04/09/2017   Fibromyalgia     PCP: Anne Ng, NP  REFERRING PROVIDER: Selmer Dominion, NP   REFERRING DIAG: [phone] (ICD-10-CM) -  Incontinence of feces with fecal urgency   THERAPY DIAG:  Muscle weakness (generalized)  Other lack of coordination  BPPV (benign paroxysmal positional vertigo), left  Rationale for Evaluation and Treatment: Rehabilitation  ONSET DATE: 2014  SUBJECTIVE:                                                                                                                                                                                           SUBJECTIVE  STATEMENT: I have not had the urge to have a bowel movement and rush to the bathroom. The urge to have a bowel movement happens more when she is walking around during the day.  Fluid intake: 24oz Coffee, 24oz Sparkling water/fizzy water per day    PAIN:  Are you having pain? No  PRECAUTIONS: None  RED FLAGS: None   WEIGHT BEARING RESTRICTIONS: No  FALLS:  Has patient fallen in last 6 months? Yes. Number of falls 5 time and does not feel like she has good balance and fall forward on her knees and hands  LIVING ENVIRONMENT: Lives with: lives with their spouse  OCCUPATION: retired  PLOF: Independent  PATIENT GOALS: learn how to manage her prolapse, reduce leakage  PERTINENT HISTORY:  Fibromalgia; cesarean section  BOWEL MOVEMENT: Pain with bowel movement: No Type of bowel movement:Type (Bristol Stool Scale) Type 1, type 4 thin, type 7, Frequency 5 per day, Strain Yes, and Splinting no Fully empty rectum: No, feels like there is a big load in the rectum and not able to get it out Leakage: Yes: 2 times per month  but has not happened since she has changed her diet Fiber supplement: Yes: metamucil gummies  URINATION: Pain with urination: No Fully empty bladder: Yes: needs to urinate multiple times afterwards  especially at night Stream: Strong and urine will spray if not sitting up Urgency: Yes: sometimes she is not able to hold the urine and happens 2 times per month; happens when she gets home and key in the  door Frequency: Day time voids 6.  Nocturia: 4-7 times per night to void. Will wake up 2 times due to strong urge to urinate Leakage: Urge to void and Walking to the bathroom Pads: No  INTERCOURSE:not active  PREGNANCY: Vaginal deliveries 3, forceps with second child with large scar and became infected C-section deliveries 1   PROLAPSE: stage I/IV Anterior, I/IV Uterine, and II/IV posterior prolapse.   OBJECTIVE:  Note: Objective measures were completed at Evaluation unless otherwise noted.  DIAGNOSTIC FINDINGS:  Pelvic floor strength I/V   PVR of 4 ml was obtained by bladder scan.   COGNITION: Overall cognitive status: Within functional limits for tasks assessed       FUNCTIONAL TESTS:  5 times sit to stand: 21.80 sec Single leg stance bil. 6 sec; tandem stance 13 sec   POSTURE: rounded shoulders, forward head, and decreased lumbar lordosis  PELVIC ALIGNMENT:  LUMBARAROM/PROM: decreased by 25%   LOWER EXTREMITY ROM: bilateral hip ROM is full   LOWER EXTREMITY MMT:  MMT Right eval Left eval  Hip flexion 4/5 4/5  Hip extension 4/5 4/5  Hip abduction 3/5 3/5  Knee extension 4/5 4/5   PALPATION:   General  decreased expansion of the lower rib cage , tenderness located in lumbar                External Perineal Exam intact, restrictions along the perineal body from the scars after child birth                             Internal Pelvic Floor thickness along the left side of the pelvic floor  Patient confirms identification and approves PT to assess internal pelvic floor and treatment Yes  PELVIC MMT:   MMT eval 10/30/23 11/06/23  Vaginal 3/5 with weak hug of therapist finger for 4 sec 3/5 holding 10 sec   Internal Anal Sphincter 2/5  3/5 for 12 seconds  External Anal Sphincter 3/5  3/5 for 12 seconds  Puborectalis 2/5   3/5 for 12 seconds  (Blank rows = not tested)        TONE: average   TODAY'S TREATMENT:    11/13/23 Manual: Soft tissue  mobilization: Circular massage to the abdomen to promote peristalic motion of the intestines then educated patient on how to perform at home Manual work to bilateral diaphragm to improve movement Scar tissue mobilization: Manual work to the c-section scar with lifting and moving through the restrictions. Pulling up of the scar to elongate.  Myofascial release: Tissue rolling of the abdomen to release the restrictions Fascial release of the lower abdomen to release around the colon Neuromuscular re-education: Down training: Diaphragmatic breathing with opening up of the lower rib cage then bringing the air into the pelvic floor.  Therapeutic activities: Functional strengthening activities: Educated patient on how to use diaphragmatic breathing to relax the pelvic floor and breathing out to push the stool out. Patient was able to demonstrate.     11/11/2023 NuStep level 3 5 minutes - PT present to discuss status Leg Press 90# bilateral 2 x 10 45# ; unilateral 2 x 10 Supine SLR 2.5 AW 2 x 8 each Supine SLR 2.5 AW 2 x 8 each Bridging 2 x 10 Sidelying Hip abduction with 2.5# AW+ ball press 2 x 8 Sit to Stands 2 x 8  8# DB  11/06/23 Manual: Soft tissue mobilization: Manual work to the perineal body to improve mobility Internal pelvic floor techniques: No emotional/communication barriers or cognitive limitation. Patient is motivated to learn. Patient understands and agrees with treatment goals and plan. PT explains patient will be examined in standing, sitting, and lying down to see how their muscles and joints work. When they are ready, they will be asked to remove their underwear so PT can examine their perineum. The patient is also given the option of providing their own chaperone as one is not provided in our facility. The patient also has the right and is explained the right to defer or refuse any part of the evaluation or treatment including the internal exam. With the patient's consent,  PT will use one gloved finger to gently assess the muscles of the pelvic floor, seeing how well it contracts and relaxes and if there is muscle symmetry. After, the patient will get dressed and PT and patient will discuss exam findings and plan of care. PT and patient discuss plan of care, schedule, attendance policy and HEP activities.  Going through the rectum performing manual work to the puborectalis, levator ani to elongate Neuromuscular re-education: Pelvic floor contraction training: Therapist finger in the rectum working on pelvic floor contraction with forward movement of the puborectalis and upward movement of the sphincters holding for 12 seconds 15 times Seated marching with red band around knees and elbow flexion 10 x 3 with pelvic floor contraction Exercises: Strengthening: Sitting hip abduction with red band, pelvic floor contraction and holding 1 pound in each hand for 10 reps; for a total of 5 times Sitting marching with red band and elbow flexion with 1 pound in each hand 10 x 3 with pelvic floor contraction Sitting knee extension with punchout of arm holding 1 pound in each hand and red band around knees 10 x 3       PATIENT EDUCATION: 11/06/23 Education details: Access Code: [email or web address] Person educated: Patient Education method: Explanation, Facilities manager, Actor cues, Verbal cues, and Handouts Education comprehension:  verbalized understanding, returned demonstration, verbal cues required, tactile cues required, and needs further education   HOME EXERCISE PROGRAM: 11/06/23 Access Code: [email or web address] URL: [email or web address] Date: 11/06/2023 Prepared by: Eulis Foster  Program Notes do 3 days of  your yoga sequence, sun salutation  Exercises - Supine Double Knee to Chest  - 1 x daily - 7 x weekly - 1 sets - 1 reps - 1 min hold - Supine Piriformis Stretch  - 1 x daily - 7 x weekly - 1 sets - 1 reps - 30 sec hold - Supine Piriformis Stretch with Leg Straight   - 1 x daily - 7 x weekly - 1 sets - 1 reps - 30 sec hold - Seated Pelvic Floor Contraction  - 3 x daily - 7 x weekly - 1 sets - 10 reps - 10 sec hold - Seated March with Resistance  - 1 x daily - 3 x weekly - 3 sets - 10 reps - Seated Pelvic Floor Contraction with Hip Abduction and Resistance Loop  - 1 x daily - 3 x weekly - 3 sets - 10 reps - Seated Knee Extension with Resistance  - 1 x daily - 3 x weekly - 3 sets - 10 reps  Access Code: [email or web address] URL: [email or web address] Date: 11/04/2023 Prepared by: Claude Manges  Exercises - Seated Hip Flexion March with Ankle Weights  - 1 x daily - 7 x weekly - 2 sets - 10 reps - Seated Long Arc Quad with Ankle Weight  - 1 x daily - 7 x weekly - 2 sets - 10 reps - Sit to Stand  - 1 x daily - 7 x weekly - 1 sets - 10 reps - Seated Hip Abduction with Resistance  - 1 x daily - 7 x weekly - 2 sets - 10 reps - Supine Lower Trunk Rotation  - 1 x daily - 7 x weekly - 1 sets - 10 reps - 5 hold - Seated Hamstring Stretch  - 1 x daily - 7 x weekly - 2 sets - 30 hold - Hip Flexion Stretch  - 1 x daily - 7 x weekly - 2 sets - 20 hold - Supine Hamstring Stretch with Strap  - 1 x daily - 7 x weekly - 2 sets - 30 hold ASSESSMENT:  CLINICAL IMPRESSION: Today's treatment session focused on pelvic floor relaxation for bowel movements.  She has restrictions in the c-section scar and knows how to mobilize them. She had improved mobility of the diaphragm and abdominal tissue to assist with expanding the lower rib cage and pelvic floor for bowel movements. Patient has not had any fecal leakage since last visit. She has a strong urge but was able to make it to the bathroom. Patient will benefit from skilled PT to address the below impairments and improve overall function.    OBJECTIVE IMPAIRMENTS: decreased activity tolerance, decreased coordination, decreased strength, and increased fascial restrictions.   ACTIVITY LIMITATIONS: continence, toileting, and  locomotion level  PARTICIPATION LIMITATIONS: community activity  PERSONAL FACTORS: Age and 1 comorbidity: Fibromalgia; cesarean section  are also affecting patient's functional outcome.   REHAB POTENTIAL: Excellent  CLINICAL DECISION MAKING: Stable/uncomplicated  EVALUATION COMPLEXITY: Low   GOALS: Goals reviewed with patient? Yes  SHORT TERM GOALS: Target date: 11/20/23  Patient independent with initial HEP for pelvic floor and balance exercises.  Baseline: Goal status: Met 11/06/23  2.  Patient is able to go from sit to stand </= 18 sec.  Baseline:  Goal  status: INITIAL   3.  Patient educated on ways to splint the perineal area to improve the expulsion of her stool due to the posterior wall weakness.  Baseline:  Goal status: Met 10/30/23  4.  Patient educated on the urge to void so she is able to walk slowly to the bathroom without leaking urine.  Baseline:  Goal status: Met 11/13/23   LONG TERM GOALS: Target date: 12/18/23  Patient independent with advanced HEP for pelvic floor, core and balance exercises.  Baseline:  Goal status: INITIAL  2.  Patient reports her confidence of not falling has improved >/= 80% when walking quickly to the bathroom due to sit to stand </= 13 sec. Baseline:  Goal status: INITIAL  3.  Patient reports her urinary leakage </= 75% due to improve pelvic floor strength >/= 3/5 holding for 5 seconds vaginally.  Baseline:  Goal status: INITIAL  4.  Rectal strength >/= 4/5 holding for 40 seconds to reduce the chance of fecal leakage.  Baseline:  Goal status: INITIAL    PLAN:  PT FREQUENCY: 2x/week  PT DURATION: 8 weeks  PLANNED INTERVENTIONS: 97110-Therapeutic exercises, 97530- Therapeutic activity, 97112- Neuromuscular re-education, 97140- Manual therapy, Patient/Family education, Balance training, Dry Needling, Cryotherapy, Moist heat, and Biofeedback  PLAN FOR NEXT SESSION:  pelvic floor strengthening, see how the bowel  movements are going  Ortho: 6 MWT; continue LE strengthening specifically quads , floor transfers  Eulis Foster, PT 11/13/23 11:24 AM

## 2023-11-13 NOTE — Patient Instructions (Signed)

## 2023-11-17 ENCOUNTER — Ambulatory Visit: Payer: 59 | Admitting: Physical Therapy

## 2023-11-17 ENCOUNTER — Encounter: Payer: Self-pay | Admitting: Physical Therapy

## 2023-11-17 DIAGNOSIS — M6281 Muscle weakness (generalized): Secondary | ICD-10-CM | POA: Diagnosis not present

## 2023-11-17 DIAGNOSIS — R278 Other lack of coordination: Secondary | ICD-10-CM | POA: Diagnosis not present

## 2023-11-17 NOTE — Therapy (Signed)
OUTPATIENT PHYSICAL THERAPY LOWER EXTREMITY TREATMENT   Patient Name: Betty Holmes MRN: [id number] DOB:03-Nov-1956, 67 y.o., female Today's Date: 11/17/2023  END OF SESSION:  PT End of Session - 11/17/23 1313     Visit Number 8    Date for PT Re-Evaluation 12/18/23    Authorization Type [employer] Medicare/medicaid    Authorization - Visit Number 8    Authorization - Number of Visits 10    PT Start Time 1236    PT Stop Time 1315    PT Time Calculation (min) 39 min    Activity Tolerance Patient tolerated treatment well    Behavior During Therapy WFL for tasks assessed/performed                 Past Medical History:  Diagnosis Date   Anxiety    Arthritis    At high risk for tick borne illness    had abx--test her again--she was okey---at [facility]   Depression    Fibromyalgia    Fibromyalgia    Past Surgical History:  Procedure Laterality Date   cataracts removal (bilateral) Bilateral    CESAREAN SECTION     CYST EXCISION Right 05/09/2017   Procedure: EXCISION RIGHT INGUINAL CYST;  Surgeon: Berna Bue, MD;  [facility]  [facility];  Service: General;  Laterality: Right;   JOINT REPLACEMENT     KNEE ARTHROSCOPY     KNEE SURGERY     OVARIAN CYST REMOVAL     Patient Active Problem List   Diagnosis Date Noted   Urinary incontinence, mixed 07/15/2023   Chronic diarrhea 10/14/2022   Obesity 10/30/2021   S/P cataract extraction 10/30/2021   Candidal intertrigo 08/22/2021   GAD (generalized anxiety disorder) 09/21/2019   Pain in left wrist 07/24/2017   Painful wrist, right 07/24/2017   Osteopenia 04/21/2017   Cervical stenosis of spine 04/21/2017   Vitamin D deficiency 04/21/2017   Recurrent major depressive disorder, in partial remission (HCC) 04/09/2017   Chronic low back pain 04/09/2017   Fibromyalgia     PCP: Anne Ng, NP  REFERRING PROVIDER: Selmer Dominion, NP   REFERRING DIAG: [phone] (ICD-10-CM)  - Incontinence of feces with fecal urgency   THERAPY DIAG:  Muscle weakness (generalized)  Other lack of coordination  Rationale for Evaluation and Treatment: Rehabilitation  ONSET DATE: 2014  SUBJECTIVE:                                                                                                                                                                                           SUBJECTIVE STATEMENT: Patient reports she is doing  okay today. Her back is painful today. 3-4/10 pain today. Fluid intake: 24oz Coffee, 24oz Sparkling water/fizzy water per day    PAIN:  Are you having pain? No  PRECAUTIONS: None  RED FLAGS: None   WEIGHT BEARING RESTRICTIONS: No  FALLS:  Has patient fallen in last 6 months? Yes. Number of falls 5 time and does not feel like she has good balance and fall forward on her knees and hands  LIVING ENVIRONMENT: Lives with: lives with their spouse  OCCUPATION: retired  PLOF: Independent  PATIENT GOALS: learn how to manage her prolapse, reduce leakage  PERTINENT HISTORY:  Fibromalgia; cesarean section  BOWEL MOVEMENT: Pain with bowel movement: No Type of bowel movement:Type (Bristol Stool Scale) Type 1, type 4 thin, type 7, Frequency 5 per day, Strain Yes, and Splinting no Fully empty rectum: No, feels like there is a big load in the rectum and not able to get it out Leakage: Yes: 2 times per month  but has not happened since she has changed her diet Fiber supplement: Yes: metamucil gummies  URINATION: Pain with urination: No Fully empty bladder: Yes: needs to urinate multiple times afterwards  especially at night Stream: Strong and urine will spray if not sitting up Urgency: Yes: sometimes she is not able to hold the urine and happens 2 times per month; happens when she gets home and key in the door Frequency: Day time voids 6.  Nocturia: 4-7 times per night to void. Will wake up 2 times due to strong urge to urinate Leakage: Urge to  void and Walking to the bathroom Pads: No  INTERCOURSE:not active  PREGNANCY: Vaginal deliveries 3, forceps with second child with large scar and became infected C-section deliveries 1   PROLAPSE: stage I/IV Anterior, I/IV Uterine, and II/IV posterior prolapse.   OBJECTIVE:  Note: Objective measures were completed at Evaluation unless otherwise noted.  DIAGNOSTIC FINDINGS:  Pelvic floor strength I/V   PVR of 4 ml was obtained by bladder scan.   COGNITION: Overall cognitive status: Within functional limits for tasks assessed       FUNCTIONAL TESTS:  5 times sit to stand: 21.80 sec Single leg stance bil. 6 sec; tandem stance 13 sec   POSTURE: rounded shoulders, forward head, and decreased lumbar lordosis  PELVIC ALIGNMENT:  LUMBARAROM/PROM: decreased by 25%   LOWER EXTREMITY ROM: bilateral hip ROM is full   LOWER EXTREMITY MMT:  MMT Right eval Left eval  Hip flexion 4/5 4/5  Hip extension 4/5 4/5  Hip abduction 3/5 3/5  Knee extension 4/5 4/5   PALPATION:   General  decreased expansion of the lower rib cage , tenderness located in lumbar                External Perineal Exam intact, restrictions along the perineal body from the scars after child birth                             Internal Pelvic Floor thickness along the left side of the pelvic floor  Patient confirms identification and approves PT to assess internal pelvic floor and treatment Yes  PELVIC MMT:   MMT eval 10/30/23 11/06/23  Vaginal 3/5 with weak hug of therapist finger for 4 sec 3/5 holding 10 sec   Internal Anal Sphincter 2/5  3/5 for 12 seconds  External Anal Sphincter 3/5  3/5 for 12 seconds  Puborectalis 2/5   3/5 for 12 seconds  (  Blank rows = not tested)        TONE: average   TODAY'S TREATMENT:    11/17/2023 NuStep level 5 5 minutes - PT present to discuss status Floor Transfer x 4   Leg Press 90# bilateral 2 x 10 45# ; unilateral 2 x 10 Step Ups 6 inch step x 20 each  leg Supine SLR 2.5 AW 2 x 10 each Bridging 2 x 10 Alt hand and knee press x 10 each Hooklying TA contraction + hip flexion with yellow loop 2 x 10 each Sit to Stands 2 x 10  8# DB   11/13/23 Manual: Soft tissue mobilization: Circular massage to the abdomen to promote peristalic motion of the intestines then educated patient on how to perform at home Manual work to bilateral diaphragm to improve movement Scar tissue mobilization: Manual work to the c-section scar with lifting and moving through the restrictions. Pulling up of the scar to elongate.  Myofascial release: Tissue rolling of the abdomen to release the restrictions Fascial release of the lower abdomen to release around the colon Neuromuscular re-education: Down training: Diaphragmatic breathing with opening up of the lower rib cage then bringing the air into the pelvic floor.  Therapeutic activities: Functional strengthening activities: Educated patient on how to use diaphragmatic breathing to relax the pelvic floor and breathing out to push the stool out. Patient was able to demonstrate.     11/11/2023 NuStep level 3 5 minutes - PT present to discuss status Leg Press 90# bilateral 2 x 10 45# ; unilateral 2 x 10 Supine SLR 2.5 AW 2 x 8 each Bridging 2 x 10 Sidelying Hip abduction with 2.5# AW+ ball press 2 x 8 Sit to Stands 2 x 8  8# DB  11/06/23 Manual: Soft tissue mobilization: Manual work to the perineal body to improve mobility Internal pelvic floor techniques: No emotional/communication barriers or cognitive limitation. Patient is motivated to learn. Patient understands and agrees with treatment goals and plan. PT explains patient will be examined in standing, sitting, and lying down to see how their muscles and joints work. When they are ready, they will be asked to remove their underwear so PT can examine their perineum. The patient is also given the option of providing their own chaperone as one is not provided  in our facility. The patient also has the right and is explained the right to defer or refuse any part of the evaluation or treatment including the internal exam. With the patient's consent, PT will use one gloved finger to gently assess the muscles of the pelvic floor, seeing how well it contracts and relaxes and if there is muscle symmetry. After, the patient will get dressed and PT and patient will discuss exam findings and plan of care. PT and patient discuss plan of care, schedule, attendance policy and HEP activities.  Going through the rectum performing manual work to the puborectalis, levator ani to elongate Neuromuscular re-education: Pelvic floor contraction training: Therapist finger in the rectum working on pelvic floor contraction with forward movement of the puborectalis and upward movement of the sphincters holding for 12 seconds 15 times Seated marching with red band around knees and elbow flexion 10 x 3 with pelvic floor contraction Exercises: Strengthening: Sitting hip abduction with red band, pelvic floor contraction and holding 1 pound in each hand for 10 reps; for a total of 5 times Sitting marching with red band and elbow flexion with 1 pound in each hand 10 x 3 with pelvic  floor contraction Sitting knee extension with punchout of arm holding 1 pound in each hand and red band around knees 10 x 3       PATIENT EDUCATION: 11/06/23 Education details: Access Code: [email or web address] Person educated: Patient Education method: Explanation, Demonstration, Actor cues, Verbal cues, and Handouts Education comprehension: verbalized understanding, returned demonstration, verbal cues required, tactile cues required, and needs further education   HOME EXERCISE PROGRAM: 11/06/23 Access Code: [email or web address] URL: [email or web address] Date: 11/06/2023 Prepared by: Eulis Foster  Program Notes do 3 days of  your yoga sequence, sun salutation  Exercises - Supine Double Knee to Chest   - 1 x daily - 7 x weekly - 1 sets - 1 reps - 1 min hold - Supine Piriformis Stretch  - 1 x daily - 7 x weekly - 1 sets - 1 reps - 30 sec hold - Supine Piriformis Stretch with Leg Straight  - 1 x daily - 7 x weekly - 1 sets - 1 reps - 30 sec hold - Seated Pelvic Floor Contraction  - 3 x daily - 7 x weekly - 1 sets - 10 reps - 10 sec hold - Seated March with Resistance  - 1 x daily - 3 x weekly - 3 sets - 10 reps - Seated Pelvic Floor Contraction with Hip Abduction and Resistance Loop  - 1 x daily - 3 x weekly - 3 sets - 10 reps - Seated Knee Extension with Resistance  - 1 x daily - 3 x weekly - 3 sets - 10 reps  Access Code: [email or web address] URL: [email or web address] Date: 11/04/2023 Prepared by: Claude Manges  Exercises - Seated Hip Flexion March with Ankle Weights  - 1 x daily - 7 x weekly - 2 sets - 10 reps - Seated Long Arc Quad with Ankle Weight  - 1 x daily - 7 x weekly - 2 sets - 10 reps - Sit to Stand  - 1 x daily - 7 x weekly - 1 sets - 10 reps - Seated Hip Abduction with Resistance  - 1 x daily - 7 x weekly - 2 sets - 10 reps - Supine Lower Trunk Rotation  - 1 x daily - 7 x weekly - 1 sets - 10 reps - 5 hold - Seated Hamstring Stretch  - 1 x daily - 7 x weekly - 2 sets - 30 hold - Hip Flexion Stretch  - 1 x daily - 7 x weekly - 2 sets - 20 hold - Supine Hamstring Stretch with Strap  - 1 x daily - 7 x weekly - 2 sets - 30 hold ASSESSMENT:  CLINICAL IMPRESSION: Today's treatment session focused of LE strengthening. Patient continues to verbalize increased back pain in the mornings. She verbalized compliance with HEP. Since starting therapy patient verbalized feeling stronger and her balance has improved. Educated and demonstrated correct floor transfer for patient. She demonstrated correct performance and verbalized ease of transfer. Patient is going out of town for multiple weeks December 17 and verbalized that after 12/3 she is okay with discharging and continuing HEP.    OBJECTIVE IMPAIRMENTS: decreased activity tolerance, decreased coordination, decreased strength, and increased fascial restrictions.   ACTIVITY LIMITATIONS: continence, toileting, and locomotion level  PARTICIPATION LIMITATIONS: community activity  PERSONAL FACTORS: Age and 1 comorbidity: Fibromalgia; cesarean section  are also affecting patient's functional outcome.   REHAB POTENTIAL: Excellent  CLINICAL DECISION MAKING: Stable/uncomplicated  EVALUATION COMPLEXITY: Low   GOALS: Goals reviewed with patient? Yes  SHORT TERM GOALS: Target date:  11/20/23  Patient independent with initial HEP for pelvic floor and balance exercises.  Baseline: Goal status: Met 11/06/23  2.  Patient is able to go from sit to stand </= 18 sec.  Baseline:  Goal status: INITIAL   3.  Patient educated on ways to splint the perineal area to improve the expulsion of her stool due to the posterior wall weakness.  Baseline:  Goal status: Met 10/30/23  4.  Patient educated on the urge to void so she is able to walk slowly to the bathroom without leaking urine.  Baseline:  Goal status: Met 11/13/23   LONG TERM GOALS: Target date: 12/18/23  Patient independent with advanced HEP for pelvic floor, core and balance exercises.  Baseline:  Goal status: INITIAL  2.  Patient reports her confidence of not falling has improved >/= 80% when walking quickly to the bathroom due to sit to stand </= 13 sec. Baseline:  Goal status: INITIAL  3.  Patient reports her urinary leakage </= 75% due to improve pelvic floor strength >/= 3/5 holding for 5 seconds vaginally.  Baseline:  Goal status: INITIAL  4.  Rectal strength >/= 4/5 holding for 40 seconds to reduce the chance of fecal leakage.  Baseline:  Goal status: INITIAL    PLAN:  PT FREQUENCY: 2x/week  PT DURATION: 8 weeks  PLANNED INTERVENTIONS: 97110-Therapeutic exercises, 97530- Therapeutic activity, 97112- Neuromuscular re-education, 97140-  Manual therapy, Patient/Family education, Balance training, Dry Needling, Cryotherapy, Moist heat, and Biofeedback  PLAN FOR NEXT SESSION:  pelvic floor strengthening, see how the bowel movements are going  Ortho: continue LE strengthening specifically quads , standing core exercises  Claude Manges, PT 11/17/23 1:19 PM

## 2023-11-18 ENCOUNTER — Encounter: Payer: Self-pay | Admitting: Physical Therapy

## 2023-11-18 ENCOUNTER — Encounter: Payer: 59 | Admitting: Physical Therapy

## 2023-11-18 DIAGNOSIS — M6281 Muscle weakness (generalized): Secondary | ICD-10-CM | POA: Diagnosis not present

## 2023-11-18 DIAGNOSIS — R278 Other lack of coordination: Secondary | ICD-10-CM | POA: Diagnosis not present

## 2023-11-18 NOTE — Therapy (Signed)
OUTPATIENT PHYSICAL THERAPY LOWER EXTREMITY TREATMENT   Patient Name: Betty Holmes MRN: [id number] DOB:[birth year], 67 y.o., female Today's Date: 11/18/2023  END OF SESSION:  PT End of Session - 11/18/23 1557     Visit Number 9    Date for PT Re-Evaluation 12/18/23    Authorization Type [employer] Medicare/medicaid    Authorization - Visit Number 9    Authorization - Number of Visits 10    PT Start Time 1555    PT Stop Time 1645    PT Time Calculation (min) 50 min    Activity Tolerance Patient tolerated treatment well    Behavior During Therapy WFL for tasks assessed/performed                 Past Medical History:  Diagnosis Date   Anxiety    Arthritis    At high risk for tick borne illness    had abx--test her again--she was okey---at [facility]   Depression    Fibromyalgia    Fibromyalgia    Past Surgical History:  Procedure Laterality Date   cataracts removal (bilateral) Bilateral    CESAREAN SECTION     CYST EXCISION Right 05/09/2017   Procedure: EXCISION RIGHT INGUINAL CYST;  Surgeon: Berna Bue, MD;  Location: [facility];  Service: General;  Laterality: Right;   JOINT REPLACEMENT     KNEE ARTHROSCOPY     KNEE SURGERY     OVARIAN CYST REMOVAL     Patient Active Problem List   Diagnosis Date Noted   Urinary incontinence, mixed 07/15/2023   Chronic diarrhea 10/14/2022   Obesity 10/30/2021   S/P cataract extraction 10/30/2021   Candidal intertrigo 08/22/2021   GAD (generalized anxiety disorder) 09/21/2019   Pain in left wrist 07/24/2017   Painful wrist, right 07/24/2017   Osteopenia 04/21/2017   Cervical stenosis of spine 04/21/2017   Vitamin D deficiency 04/21/2017   Recurrent major depressive disorder, in partial remission (HCC) 04/09/2017   Chronic low back pain 04/09/2017   Fibromyalgia     PCP: Anne Ng, NP  REFERRING PROVIDER: Selmer Dominion, NP   REFERRING DIAG: [phone] (ICD-10-CM)  - Incontinence of feces with fecal urgency   THERAPY DIAG:  Muscle weakness (generalized)  Other lack of coordination  Rationale for Evaluation and Treatment: Rehabilitation  ONSET DATE: 2014  SUBJECTIVE:                                                                                                                                                                                           SUBJECTIVE STATEMENT:. I have to go to  the bathroom 5 times per day. I get the urge to have a bowel movement when I am walking. Patient has not leaked stool. Wipe herself there is still stool left.  Fluid intake: 24oz Coffee, 24oz Sparkling water/fizzy water per day    PAIN:  Are you having pain? No  PRECAUTIONS: None  RED FLAGS: None   WEIGHT BEARING RESTRICTIONS: No  FALLS:  Has patient fallen in last 6 months? Yes. Number of falls 5 time and does not feel like she has good balance and fall forward on her knees and hands  LIVING ENVIRONMENT: Lives with: lives with their spouse  OCCUPATION: retired  PLOF: Independent  PATIENT GOALS: learn how to manage her prolapse, reduce leakage  PERTINENT HISTORY:  Fibromalgia; cesarean section  BOWEL MOVEMENT: Pain with bowel movement: No Type of bowel movement:Type (Bristol Stool Scale) Type 1, type 4 thin, type 7, Frequency 5 per day, Strain Yes, and Splinting no Fully empty rectum: No, feels like there is a big load in the rectum and not able to get it out Leakage: Yes: 2 times per month  but has not happened since she has changed her diet Fiber supplement: Yes: metamucil gummies  URINATION: Pain with urination: No Fully empty bladder: Yes: needs to urinate multiple times afterwards  especially at night Stream: Strong and urine will spray if not sitting up Urgency: Yes: sometimes she is not able to hold the urine and happens 2 times per month; happens when she gets home and key in the door Frequency: Day time voids 6.  Nocturia: 4-7 times  per night to void. Will wake up 2 times due to strong urge to urinate Leakage: Urge to void and Walking to the bathroom Pads: No  INTERCOURSE:not active  PREGNANCY: Vaginal deliveries 3, forceps with second child with large scar and became infected C-section deliveries 1   PROLAPSE: stage I/IV Anterior, I/IV Uterine, and II/IV posterior prolapse.   OBJECTIVE:  Note: Objective measures were completed at Evaluation unless otherwise noted.  DIAGNOSTIC FINDINGS:  Pelvic floor strength I/V   PVR of 4 ml was obtained by bladder scan.   COGNITION: Overall cognitive status: Within functional limits for tasks assessed       FUNCTIONAL TESTS:  5 times sit to stand: 21.80 sec Single leg stance bil. 6 sec; tandem stance 13 sec 11/18/23 5 times Sit to stand: 19 sec , 14 sec, 12 sec   POSTURE: rounded shoulders, forward head, and decreased lumbar lordosis  PELVIC ALIGNMENT:  LUMBARAROM/PROM: decreased by 25%   LOWER EXTREMITY ROM: bilateral hip ROM is full   LOWER EXTREMITY MMT:  MMT Right eval Left eval  Hip flexion 4/5 4/5  Hip extension 4/5 4/5  Hip abduction 3/5 3/5  Knee extension 4/5 4/5   PALPATION:   General  decreased expansion of the lower rib cage , tenderness located in lumbar                External Perineal Exam intact, restrictions along the perineal body from the scars after child birth                             Internal Pelvic Floor thickness along the left side of the pelvic floor  Patient confirms identification and approves PT to assess internal pelvic floor and treatment Yes  PELVIC MMT:   MMT eval 10/30/23 11/06/23  Vaginal 3/5 with weak hug of therapist finger for 4 sec  3/5 holding 10 sec   Internal Anal Sphincter 2/5  3/5 for 12 seconds  External Anal Sphincter 3/5  3/5 for 12 seconds  Puborectalis 2/5   3/5 for 12 seconds  (Blank rows = not tested)        TONE: average   TODAY'S TREATMENT:    11/18/23 Manual: Soft tissue  mobilization: Circular massage to the abdomen to promote peristalic motion of the intestines then educated patient on how to perform at home Manual work to bilateral diaphragm to improve movement Scar tissue mobilization: Manual work to the c-section scar with lifting and moving through the restrictions. Pulling up of the scar to elongate. Myofascial release: Fascial release of the lower abdomen to release around the colon Fascial release around the umbilicus to elongate the tissue and help stool move through Neuromuscular re-education: Down training: Educated patient on urge to void and ways to deter her thought about the urge by walking in different directions, walk backwards, side step and make circles. Had her stop what she is doing to take control of her pelvic floor to contract and hold back stool.  Therapeutic activities: Functional strengthening activities: Educated patient on toileting to add in hip movements to relax the pelvic floor to push the stool out  11/17/2023 NuStep level 5 5 minutes - PT present to discuss status Floor Transfer x 4   Leg Press 90# bilateral 2 x 10 45# ; unilateral 2 x 10 Step Ups 6 inch step x 20 each leg Supine SLR 2.5 AW 2 x 10 each Bridging 2 x 10 Alt hand and knee press x 10 each Hooklying TA contraction + hip flexion with yellow loop 2 x 10 each Sit to Stands 2 x 10  8# DB   11/13/23 Manual: Soft tissue mobilization: Circular massage to the abdomen to promote peristalic motion of the intestines then educated patient on how to perform at home Manual work to bilateral diaphragm to improve movement Scar tissue mobilization: Manual work to the c-section scar with lifting and moving through the restrictions. Pulling up of the scar to elongate.  Myofascial release: Tissue rolling of the abdomen to release the restrictions Fascial release of the lower abdomen to release around the colon Neuromuscular re-education: Down training: Diaphragmatic  breathing with opening up of the lower rib cage then bringing the air into the pelvic floor.  Therapeutic activities: Functional strengthening activities: Educated patient on how to use diaphragmatic breathing to relax the pelvic floor and breathing out to push the stool out. Patient was able to demonstrate.     11/11/2023 NuStep level 3 5 minutes - PT present to discuss status Leg Press 90# bilateral 2 x 10 45# ; unilateral 2 x 10 Supine SLR 2.5 AW 2 x 8 each Bridging 2 x 10 Sidelying Hip abduction with 2.5# AW+ ball press 2 x 8 Sit to Stands 2 x 8  8# DB  PATIENT EDUCATION: 11/06/23 Education details: Access Code: [email or web address] Person educated: Patient Education method: Programmer, multimedia, Demonstration, Actor cues, Verbal cues, and Handouts Education comprehension: verbalized understanding, returned demonstration, verbal cues required, tactile cues required, and needs further education   HOME EXERCISE PROGRAM: 11/06/23 Access Code: [email or web address] URL: [email or web address] Date: 11/06/2023 Prepared by: Eulis Foster  Program Notes do 3 days of  your yoga sequence, sun salutation  Exercises - Supine Double Knee to Chest  - 1 x daily - 7 x weekly - 1 sets - 1 reps - 1 min hold - Supine Piriformis Stretch  - 1  x daily - 7 x weekly - 1 sets - 1 reps - 30 sec hold - Supine Piriformis Stretch with Leg Straight  - 1 x daily - 7 x weekly - 1 sets - 1 reps - 30 sec hold - Seated Pelvic Floor Contraction  - 3 x daily - 7 x weekly - 1 sets - 10 reps - 10 sec hold - Seated March with Resistance  - 1 x daily - 3 x weekly - 3 sets - 10 reps - Seated Pelvic Floor Contraction with Hip Abduction and Resistance Loop  - 1 x daily - 3 x weekly - 3 sets - 10 reps - Seated Knee Extension with Resistance  - 1 x daily - 3 x weekly - 3 sets - 10 reps  Access Code: [email or web address] URL: [email or web address] Date: 11/04/2023 Prepared by: Claude Manges  Exercises - Seated Hip Flexion March  with Ankle Weights  - 1 x daily - 7 x weekly - 2 sets - 10 reps - Seated Long Arc Quad with Ankle Weight  - 1 x daily - 7 x weekly - 2 sets - 10 reps - Sit to Stand  - 1 x daily - 7 x weekly - 1 sets - 10 reps - Seated Hip Abduction with Resistance  - 1 x daily - 7 x weekly - 2 sets - 10 reps - Supine Lower Trunk Rotation  - 1 x daily - 7 x weekly - 1 sets - 10 reps - 5 hold - Seated Hamstring Stretch  - 1 x daily - 7 x weekly - 2 sets - 30 hold - Hip Flexion Stretch  - 1 x daily - 7 x weekly - 2 sets - 20 hold - Supine Hamstring Stretch with Strap  - 1 x daily - 7 x weekly - 2 sets - 30 hold ASSESSMENT:  CLINICAL IMPRESSION: Today's treatment session focused of pelvic floor strengthening. Patient was able to do sit to stand 5 times average 14 sec compared to 21.80 sec. She understands ways to deter the urge to have a bowel movement. She will use the techniques in the future. She has difficulty getting the urge to have a bowel movement when she walks. She will try to have a bowel movement but nothing will come out. She has not had stool leakage in 2 weeks. She has not had urinary leakage for several weeks. Her rectal strength is 3/5 and holds for 12 seconds. Patient will benefit from skilled therapy to improve her ways to have a bowel movement and improve overall strength.   OBJECTIVE IMPAIRMENTS: decreased activity tolerance, decreased coordination, decreased strength, and increased fascial restrictions.   ACTIVITY LIMITATIONS: continence, toileting, and locomotion level  PARTICIPATION LIMITATIONS: community activity  PERSONAL FACTORS: Age and 1 comorbidity: Fibromalgia; cesarean section  are also affecting patient's functional outcome.   REHAB POTENTIAL: Excellent  CLINICAL DECISION MAKING: Stable/uncomplicated  EVALUATION COMPLEXITY: Low   GOALS: Goals reviewed with patient? Yes  SHORT TERM GOALS: Target date: 11/20/23  Patient independent with initial HEP for pelvic floor and  balance exercises.  Baseline: Goal status: Met 11/06/23  2.  Patient is able to go from sit to stand </= 18 sec.  Baseline:  Goal status: INITIAL   3.  Patient educated on ways to splint the perineal area to improve the expulsion of her stool due to the posterior wall weakness.  Baseline:  Goal status: Met 10/30/23  4.  Patient educated on the urge to void so she is  able to walk slowly to the bathroom without leaking urine.  Baseline:  Goal status: Met 11/13/23   LONG TERM GOALS: Target date: 12/18/23  Patient independent with advanced HEP for pelvic floor, core and balance exercises.  Baseline:  Goal status: INITIAL  2.  Patient reports her confidence of not falling has improved >/= 80% when walking quickly to the bathroom due to sit to stand </= 13 sec. Baseline:  Goal status: INITIAL  3.  Patient reports her urinary leakage </= 75% due to improve pelvic floor strength >/= 3/5 holding for 5 seconds vaginally.  Baseline:  Goal status: INITIAL  4.  Rectal strength >/= 4/5 holding for 40 seconds to reduce the chance of fecal leakage.  Baseline:  Goal status: INITIAL    PLAN:  PT FREQUENCY: 2x/week  PT DURATION: 8 weeks  PLANNED INTERVENTIONS: 97110-Therapeutic exercises, 97530- Therapeutic activity, 97112- Neuromuscular re-education, 97140- Manual therapy, Patient/Family education, Balance training, Dry Needling, Cryotherapy, Moist heat, and Biofeedback  PLAN FOR NEXT SESSION:  pelvic floor strengthening, see how the bowel movements are going and the urge to have a bowel movement.   Ortho: continue LE strengthening specifically quads , standing core exercises; write 10th visit note from 10/31 to 12/2.   Eulis Foster, PT 11/18/23 4:49 PM

## 2023-11-18 NOTE — Patient Instructions (Signed)
Urge Incontinence  Ideal urination frequency is every 2-4 wakeful hours, which equates to 5-8 times within a 24-hour period.   Urge incontinence is leakage that occurs when the bladder muscle contracts, creating a sudden need to go before getting to the bathroom.   Going too often when your bladder isn't actually full can disrupt the body's automatic signals to store and hold urine longer, which will increase urgency/frequency.  In this case, the bladder "is running the show" and strategies can be learned to retrain this pattern.   One should be able to control the first urge to urinate, at around .  The bladder can hold up to a "grande latte," or . To help you gain control, practice the Urge Drill below when urgency strikes.  This drill will help retrain your bladder signals and allow you to store and hold urine longer.  The overall goal is to stretch out your time between voids to reach a more manageable voiding schedule.    Practice your "quick flicks" often throughout the day (each waking hour) even when you don't need feel the urge to go.  This will help strengthen your pelvic floor muscles, making them more effective in controlling leakage.  Urge Drill  When you feel an urge to go, follow these steps to regain control: Stop what you are doing and be still Take one deep breath, directing your air into your abdomen Think an affirming thought, such as "I've got this." Do 5 quick flicks of your pelvic floor Raise your heels 5 times  Walk with control to the bathroom to void, or delay voiding If the urge comes repeat as above.  Also change how you are to walk to make you think  Betty Holmes, PT [facility] [address], [address] [place], [place] [address] W: [phone] [email or web address]

## 2023-11-24 ENCOUNTER — Ambulatory Visit: Payer: 59 | Attending: Obstetrics and Gynecology | Admitting: Physical Therapy

## 2023-11-24 ENCOUNTER — Encounter: Payer: Self-pay | Admitting: Physical Therapy

## 2023-11-24 DIAGNOSIS — M6281 Muscle weakness (generalized): Secondary | ICD-10-CM | POA: Diagnosis not present

## 2023-11-24 DIAGNOSIS — R278 Other lack of coordination: Secondary | ICD-10-CM | POA: Insufficient documentation

## 2023-11-24 NOTE — Therapy (Signed)
OUTPATIENT PHYSICAL THERAPY LOWER EXTREMITY TREATMENT / PROGRESS NOTE   Patient Name: Betty Holmes MRN: [id number] DOB:1956/05/27, 67 y.o., female Today's Date: 11/24/2023 Progress Note Reporting Period 10/23/2023 to 11/24/2023  See note below for Objective Data and Assessment of Progress/Goals.   .  END OF SESSION:  PT End of Session - 11/24/23 1323     Visit Number 10    Date for PT Re-Evaluation 12/18/23    Authorization Type [employer] Medicare/medicaid    Authorization - Visit Number 10    Authorization - Number of Visits 10    PT Start Time 1233    PT Stop Time 1315    PT Time Calculation (min) 42 min    Activity Tolerance Patient tolerated treatment well    Behavior During Therapy WFL for tasks assessed/performed                  Past Medical History:  Diagnosis Date   Anxiety    Arthritis    At high risk for tick borne illness    had abx--test her again--she was okey---at [facility]   Depression    Fibromyalgia    Fibromyalgia    Past Surgical History:  Procedure Laterality Date   cataracts removal (bilateral) Bilateral    CESAREAN SECTION     CYST EXCISION Right 05/09/2017   Procedure: EXCISION RIGHT INGUINAL CYST;  Surgeon: Berna Bue, MD;  Location: [facility];  Service: General;  Laterality: Right;   JOINT REPLACEMENT     KNEE ARTHROSCOPY     KNEE SURGERY     OVARIAN CYST REMOVAL     Patient Active Problem List   Diagnosis Date Noted   Urinary incontinence, mixed 07/15/2023   Chronic diarrhea 10/14/2022   Obesity 10/30/2021   S/P cataract extraction 10/30/2021   Candidal intertrigo 08/22/2021   GAD (generalized anxiety disorder) 09/21/2019   Pain in left wrist 07/24/2017   Painful wrist, right 07/24/2017   Osteopenia 04/21/2017   Cervical stenosis of spine 04/21/2017   Vitamin D deficiency 04/21/2017   Recurrent major depressive disorder, in partial remission (HCC) 04/09/2017   Chronic low back pain  04/09/2017   Fibromyalgia     PCP: Anne Ng, NP  REFERRING PROVIDER: Selmer Dominion, NP   REFERRING DIAG: [phone] (ICD-10-CM) - Incontinence of feces with fecal urgency   THERAPY DIAG:  Muscle weakness (generalized)  Other lack of coordination  Rationale for Evaluation and Treatment: Rehabilitation  ONSET DATE: 2014  SUBJECTIVE:  SUBJECTIVE STATEMENT:. Patient reports she is doing good today. She is trying to stay compliant with home exercises and keep moving her body. Fluid intake: 24oz Coffee, 24oz Sparkling water/fizzy water per day    PAIN:  Are you having pain? No  PRECAUTIONS: None  RED FLAGS: None   WEIGHT BEARING RESTRICTIONS: No  FALLS:  Has patient fallen in last 6 months? Yes. Number of falls 5 time and does not feel like she has good balance and fall forward on her knees and hands  LIVING ENVIRONMENT: Lives with: lives with their spouse  OCCUPATION: retired  PLOF: Independent  PATIENT GOALS: learn how to manage her prolapse, reduce leakage  PERTINENT HISTORY:  Fibromalgia; cesarean section  BOWEL MOVEMENT: Pain with bowel movement: No Type of bowel movement:Type (Bristol Stool Scale) Type 1, type 4 thin, type 7, Frequency 5 per day, Strain Yes, and Splinting no Fully empty rectum: No, feels like there is a big load in the rectum and not able to get it out Leakage: Yes: 2 times per month  but has not happened since she has changed her diet Fiber supplement: Yes: metamucil gummies  URINATION: Pain with urination: No Fully empty bladder: Yes: needs to urinate multiple times afterwards  especially at night Stream: Strong and urine will spray if not sitting up Urgency: Yes: sometimes she is not able to hold the urine and happens 2 times per month;  happens when she gets home and key in the door Frequency: Day time voids 6.  Nocturia: 4-7 times per night to void. Will wake up 2 times due to strong urge to urinate Leakage: Urge to void and Walking to the bathroom Pads: No  INTERCOURSE:not active  PREGNANCY: Vaginal deliveries 3, forceps with second child with large scar and became infected C-section deliveries 1   PROLAPSE: stage I/IV Anterior, I/IV Uterine, and II/IV posterior prolapse.   OBJECTIVE:  Note: Objective measures were completed at Evaluation unless otherwise noted.  DIAGNOSTIC FINDINGS:  Pelvic floor strength I/V   PVR of 4 ml was obtained by bladder scan.   COGNITION: Overall cognitive status: Within functional limits for tasks assessed       FUNCTIONAL TESTS:  5 times sit to stand: 21.80 sec Single leg stance bil. 6 sec; tandem stance 13 sec  11/18/23 5 times Sit to stand: 19 sec , 14 sec, 12 sec  11/24/2023 Single leg balance Rt: 16.62 sec;  Lt:30 sec Tandem Rt infront: 18.03sec   Lt infront:30sec 5STS no UE support: 10.09sec  POSTURE: rounded shoulders, forward head, and decreased lumbar lordosis  PELVIC ALIGNMENT:  LUMBARAROM/PROM: decreased by 25%   LOWER EXTREMITY ROM: bilateral hip ROM is full   LOWER EXTREMITY MMT:  MMT Right eval Left eval  Hip flexion 4/5 4/5  Hip extension 4/5 4/5  Hip abduction 3/5 3/5  Knee extension 4/5 4/5   PALPATION:   General  decreased expansion of the lower rib cage , tenderness located in lumbar                External Perineal Exam intact, restrictions along the perineal body from the scars after child birth                             Internal Pelvic Floor thickness along the left side of the pelvic floor  Patient confirms identification and approves PT to assess internal pelvic floor and treatment Yes  PELVIC MMT:   MMT  eval 10/30/23 11/06/23  Vaginal 3/5 with weak hug of therapist finger for 4 sec 3/5 holding 10 sec   Internal Anal  Sphincter 2/5  3/5 for 12 seconds  External Anal Sphincter 3/5  3/5 for 12 seconds  Puborectalis 2/5   3/5 for 12 seconds  (Blank rows = not tested)        TONE: average   TODAY'S TREATMENT:    11/24/2023 NuStep level 5 5 minutes - PT present to discuss status 5STS: 10.09 sec no UE support Single Leg Balance Sun Salutation Yoga Flow (high plank, forward fold- halfway lift, high plank, runners lunge) Pallof Press on cable column 5# x 20 each direction Leg Press 90# bilateral 2 x 10 45# ; unilateral 2 x 12 Chops with blue TB x 10 each direction Standing chest stretch with towel Seated upper Trap & levator stretch 2 x 30 sec bilateral   11/18/23 Manual: Soft tissue mobilization: Circular massage to the abdomen to promote peristalic motion of the intestines then educated patient on how to perform at home Manual work to bilateral diaphragm to improve movement Scar tissue mobilization: Manual work to the c-section scar with lifting and moving through the restrictions. Pulling up of the scar to elongate. Myofascial release: Fascial release of the lower abdomen to release around the colon Fascial release around the umbilicus to elongate the tissue and help stool move through Neuromuscular re-education: Down training: Educated patient on urge to void and ways to deter her thought about the urge by walking in different directions, walk backwards, side step and make circles. Had her stop what she is doing to take control of her pelvic floor to contract and hold back stool.  Therapeutic activities: Functional strengthening activities: Educated patient on toileting to add in hip movements to relax the pelvic floor to push the stool out  11/17/2023 NuStep level 5 5 minutes - PT present to discuss status Floor Transfer x 4   Leg Press 90# bilateral 2 x 10 45# ; unilateral 2 x 10 Step Ups 6 inch step x 20 each leg Supine SLR 2.5 AW 2 x 10 each Bridging 2 x 10 Alt hand and knee press x 10  each Hooklying TA contraction + hip flexion with yellow loop 2 x 10 each Sit to Stands 2 x 10  8# DB   11/13/23 Manual: Soft tissue mobilization: Circular massage to the abdomen to promote peristalic motion of the intestines then educated patient on how to perform at home Manual work to bilateral diaphragm to improve movement Scar tissue mobilization: Manual work to the c-section scar with lifting and moving through the restrictions. Pulling up of the scar to elongate.  Myofascial release: Tissue rolling of the abdomen to release the restrictions Fascial release of the lower abdomen to release around the colon Neuromuscular re-education: Down training: Diaphragmatic breathing with opening up of the lower rib cage then bringing the air into the pelvic floor.  Therapeutic activities: Functional strengthening activities: Educated patient on how to use diaphragmatic breathing to relax the pelvic floor and breathing out to push the stool out. Patient was able to demonstrate.     11/11/2023 NuStep level 3 5 minutes - PT present to discuss status Leg Press 90# bilateral 2 x 10 45# ; unilateral 2 x 10 Supine SLR 2.5 AW 2 x 8 each Bridging 2 x 10 Sidelying Hip abduction with 2.5# AW+ ball press 2 x 8 Sit to Stands 2 x 8  8# DB  PATIENT EDUCATION:  11/06/23 Education details: Access Code: [email or web address] Person educated: Patient Education method: Explanation, Demonstration, Actor cues, Verbal cues, and Handouts Education comprehension: verbalized understanding, returned demonstration, verbal cues required, tactile cues required, and needs further education   HOME EXERCISE PROGRAM: 11/06/23 Access Code: [email or web address] URL: [email or web address] Date: 11/06/2023 Prepared by: Eulis Foster  Program Notes do 3 days of  your yoga sequence, sun salutation  Exercises - Supine Double Knee to Chest  - 1 x daily - 7 x weekly - 1 sets - 1 reps - 1 min hold - Supine Piriformis Stretch  -  1 x daily - 7 x weekly - 1 sets - 1 reps - 30 sec hold - Supine Piriformis Stretch with Leg Straight  - 1 x daily - 7 x weekly - 1 sets - 1 reps - 30 sec hold - Seated Pelvic Floor Contraction  - 3 x daily - 7 x weekly - 1 sets - 10 reps - 10 sec hold - Seated March with Resistance  - 1 x daily - 3 x weekly - 3 sets - 10 reps - Seated Pelvic Floor Contraction with Hip Abduction and Resistance Loop  - 1 x daily - 3 x weekly - 3 sets - 10 reps - Seated Knee Extension with Resistance  - 1 x daily - 3 x weekly - 3 sets - 10 reps  Access Code: [email or web address] URL: [email or web address] Date: 11/24/2023 Prepared by: Claude Manges  Exercises - Seated Hip Flexion March with Ankle Weights  - 1 x daily - 7 x weekly - 2 sets - 10 reps - Seated Long Arc Quad with Ankle Weight  - 1 x daily - 7 x weekly - 2 sets - 10 reps - Sit to Stand  - 1 x daily - 7 x weekly - 1 sets - 10 reps - Seated Hip Abduction with Resistance  - 1 x daily - 7 x weekly - 2 sets - 10 reps - Supine Lower Trunk Rotation  - 1 x daily - 7 x weekly - 1 sets - 10 reps - 5 hold - Seated Hamstring Stretch  - 1 x daily - 7 x weekly - 2 sets - 30 hold - Hip Flexion Stretch  - 1 x daily - 7 x weekly - 2 sets - 20 hold - Supine Hamstring Stretch with Strap  - 1 x daily - 7 x weekly - 2 sets - 30 hold - Standing Anti-Rotation Press with Anchored Resistance  - 1 x daily - 7 x weekly - 2 sets - 10 reps - Standing Diagonal Chop  - 1 x daily - 7 x weekly - 2 sets - 10 reps - Seated Upper Trapezius Stretch  - 1 x daily - 7 x weekly - 2 sets - 10 reps - Seated Levator Scapulae Stretch  - 1 x daily - 7 x weekly - 2 sets - 10 reps  ASSESSMENT:  CLINICAL IMPRESSION:  Betty Holmes has made great improvements since starting therapy. She verbalized more confidence in her balance and improved overall strength. She verbalized challenges in staying compliant with HEP. Educated patient to perform a few exercises a day and that it takes 21 days to form a  habit. Updated patient's HEP to include standing core exercises and neck mobility stretches. Patient required verbal and visual cues for correct exercise performance.  Patient has met all orthopedic goals at this time and is ready for discharge.   OBJECTIVE IMPAIRMENTS: decreased activity tolerance, decreased coordination, decreased strength, and increased fascial  restrictions.   ACTIVITY LIMITATIONS: continence, toileting, and locomotion level  PARTICIPATION LIMITATIONS: community activity  PERSONAL FACTORS: Age and 1 comorbidity: Fibromalgia; cesarean section  are also affecting patient's functional outcome.   REHAB POTENTIAL: Excellent  CLINICAL DECISION MAKING: Stable/uncomplicated  EVALUATION COMPLEXITY: Low   GOALS: Goals reviewed with patient? Yes  SHORT TERM GOALS: Target date: 11/20/23  Patient independent with initial HEP for pelvic floor and balance exercises.  Baseline: Goal status: Met 11/06/23  2.  Patient is able to go from sit to stand </= 18 sec.  Baseline:  Goal status: Met 11/24/2023   3.  Patient educated on ways to splint the perineal area to improve the expulsion of her stool due to the posterior wall weakness.  Baseline:  Goal status: Met 10/30/23  4.  Patient educated on the urge to void so she is able to walk slowly to the bathroom without leaking urine.  Baseline:  Goal status: Met 11/13/23   LONG TERM GOALS: Target date: 12/18/23  Patient independent with advanced HEP for pelvic floor, core and balance exercises.  Baseline:  Goal status: INITIAL  2.  Patient reports her confidence of not falling has improved >/= 80% when walking quickly to the bathroom due to sit to stand </= 13 sec. Baseline:  Goal status: Met 11/24/2023  3.  Patient reports her urinary leakage </= 75% due to improve pelvic floor strength >/= 3/5 holding for 5 seconds vaginally.  Baseline:  Goal status: INITIAL  4.  Rectal strength >/= 4/5 holding for 40 seconds to reduce  the chance of fecal leakage.  Baseline:  Goal status: INITIAL    PLAN:  PT FREQUENCY: 2x/week  PT DURATION: 8 weeks  PLANNED INTERVENTIONS: 97110-Therapeutic exercises, 97530- Therapeutic activity, 97112- Neuromuscular re-education, 97140- Manual therapy, Patient/Family education, Balance training, Dry Needling, Cryotherapy, Moist heat, and Biofeedback  PLAN FOR NEXT SESSION:  pelvic floor strengthening, see how the bowel movements are going and the urge to have a bowel movement.   Ortho: patient to discharge with HEP after pelvic floor treatment  Claude Manges, PT 11/24/23 1:23 PM

## 2023-11-25 ENCOUNTER — Encounter: Payer: Self-pay | Admitting: Physical Therapy

## 2023-11-25 ENCOUNTER — Encounter: Payer: 59 | Attending: Obstetrics and Gynecology | Admitting: Physical Therapy

## 2023-11-25 DIAGNOSIS — M6281 Muscle weakness (generalized): Secondary | ICD-10-CM | POA: Diagnosis not present

## 2023-11-25 DIAGNOSIS — R278 Other lack of coordination: Secondary | ICD-10-CM | POA: Insufficient documentation

## 2023-11-25 NOTE — Therapy (Signed)
OUTPATIENT PHYSICAL THERAPY LOWER EXTREMITY TREATMENT / PROGRESS NOTE   Patient Name: Betty Holmes MRN: [id number] DOB:April 20, 1956, 67 y.o., female Today's Date: 11/25/2023 Progress Note Reporting Period 10/23/2023 to 11/24/2023  See note below for Objective Data and Assessment of Progress/Goals.   .  END OF SESSION:  PT End of Session - 11/25/23 1134     Visit Number 11    Date for PT Re-Evaluation 12/18/23    Authorization Type [employer] Medicare/medicaid    Authorization - Visit Number 11    PT Start Time 1130    PT Stop Time 1215    PT Time Calculation (min) 45 min    Activity Tolerance Patient tolerated treatment well    Behavior During Therapy WFL for tasks assessed/performed                  Past Medical History:  Diagnosis Date   Anxiety    Arthritis    At high risk for tick borne illness    had abx--test her again--she was okey---at [facility]   Depression    Fibromyalgia    Fibromyalgia    Past Surgical History:  Procedure Laterality Date   cataracts removal (bilateral) Bilateral    CESAREAN SECTION     CYST EXCISION Right 05/09/2017   Procedure: EXCISION RIGHT INGUINAL CYST;  Surgeon: Berna Bue, MD;  Location: [facility];  Service: General;  Laterality: Right;   JOINT REPLACEMENT     KNEE ARTHROSCOPY     KNEE SURGERY     OVARIAN CYST REMOVAL     Patient Active Problem List   Diagnosis Date Noted   Urinary incontinence, mixed 07/15/2023   Chronic diarrhea 10/14/2022   Obesity 10/30/2021   S/P cataract extraction 10/30/2021   Candidal intertrigo 08/22/2021   GAD (generalized anxiety disorder) 09/21/2019   Pain in left wrist 07/24/2017   Painful wrist, right 07/24/2017   Osteopenia 04/21/2017   Cervical stenosis of spine 04/21/2017   Vitamin D deficiency 04/21/2017   Recurrent major depressive disorder, in partial remission (HCC) 04/09/2017   Chronic low back pain 04/09/2017   Fibromyalgia     PCP:  Anne Ng, NP  REFERRING PROVIDER: Selmer Dominion, NP   REFERRING DIAG: [phone] (ICD-10-CM) - Incontinence of feces with fecal urgency   THERAPY DIAG:  Muscle weakness (generalized)  Other lack of coordination  Rationale for Evaluation and Treatment: Rehabilitation  ONSET DATE: 2014  SUBJECTIVE:  SUBJECTIVE STATEMENT:. Patient reports she is doing good today. She is trying to stay compliant with home exercises and keep moving her body. Fluid intake: 24oz Coffee, 24oz Sparkling water/fizzy water per day    PAIN:  Are you having pain? No  PRECAUTIONS: None  RED FLAGS: None   WEIGHT BEARING RESTRICTIONS: No  FALLS:  Has patient fallen in last 6 months? Yes. Number of falls 5 time and does not feel like she has good balance and fall forward on her knees and hands  LIVING ENVIRONMENT: Lives with: lives with their spouse  OCCUPATION: retired  PLOF: Independent  PATIENT GOALS: learn how to manage her prolapse, reduce leakage  PERTINENT HISTORY:  Fibromalgia; cesarean section  BOWEL MOVEMENT: Pain with bowel movement: No Type of bowel movement:Type (Bristol Stool Scale) Type 1, type 4 thin, type 7, Frequency 5 per day, Strain Yes, and Splinting no Fully empty rectum: No, feels like there is a big load in the rectum and not able to get it out Leakage: Yes: 2 times per month  but has not happened since she has changed her diet Fiber supplement: Yes: metamucil gummies  URINATION: Pain with urination: No Fully empty bladder: yes Stream: Strong and urine will spray if not sitting up Urgency: Yes: sometimes she is not able to hold the urine and happens 2 times per month; happens when she gets home and key in the door Frequency: Day time voids 6.  Nocturia: 1 times per night  to void Leakage: none Pads: No  INTERCOURSE:not active  PREGNANCY: Vaginal deliveries 3, forceps with second child with large scar and became infected C-section deliveries 1   PROLAPSE: stage I/IV Anterior, I/IV Uterine, and II/IV posterior prolapse.   OBJECTIVE:  Note: Objective measures were completed at Evaluation unless otherwise noted.  DIAGNOSTIC FINDINGS:  Pelvic floor strength I/V   PVR of 4 ml was obtained by bladder scan.   COGNITION: Overall cognitive status: Within functional limits for tasks assessed       FUNCTIONAL TESTS:  5 times sit to stand: 21.80 sec Single leg stance bil. 6 sec; tandem stance 13 sec  11/18/23 5 times Sit to stand: 19 sec , 14 sec, 12 sec  11/24/2023 Single leg balance Rt: 16.62 sec;  Lt:30 sec Tandem Rt infront: 18.03sec   Lt infront:30sec 5STS no UE support: 10.09sec  POSTURE: rounded shoulders, forward head, and decreased lumbar lordosis  PELVIC ALIGNMENT:  LUMBARAROM/PROM: decreased by 25%   LOWER EXTREMITY ROM: bilateral hip ROM is full   LOWER EXTREMITY MMT:  MMT Right eval Left eval Right/left 11/25/23  Hip flexion 4/5 4/5 4+/5  Hip extension 4/5 4/5 4+/5  Hip abduction 3/5 3/5 3/5  Knee extension 4/5 4/5 4+/5   PALPATION:   General  decreased expansion of the lower rib cage , tenderness located in lumbar                External Perineal Exam intact, restrictions along the perineal body from the scars after child birth                             Internal Pelvic Floor thickness along the left side of the pelvic floor  Patient confirms identification and approves PT to assess internal pelvic floor and treatment Yes  PELVIC MMT:   MMT eval 10/30/23 11/06/23  Vaginal 3/5 with weak hug of therapist finger for 4 sec 3/5 holding 10 sec   Internal  Anal Sphincter 2/5  3/5 for 12 seconds  External Anal Sphincter 3/5  3/5 for 12 seconds  Puborectalis 2/5   3/5 for 12 seconds  (Blank rows = not tested)         TONE: average   TODAY'S TREATMENT:    11/25/23 Exercises: Strengthening: Hip abduction laying on side 10 x  15 sit to stand  Standing chop with blue band 20 x each way Squat with scapular retraction with blue band 10 x  Pallof 15 x each way Bilateral shoulder extension with one foot flat and other toe on ground Knee extension with blue band in sitting 15 x 2    11/24/2023 NuStep level 5 5 minutes - PT present to discuss status 5STS: 10.09 sec no UE support Single Leg Balance Sun Salutation Yoga Flow (high plank, forward fold- halfway lift, high plank, runners lunge) Pallof Press on cable column 5# x 20 each direction Leg Press 90# bilateral 2 x 10 45# ; unilateral 2 x 12 Chops with blue TB x 10 each direction Standing chest stretch with towel Seated upper Trap & levator stretch 2 x 30 sec bilateral   11/18/23 Manual: Soft tissue mobilization: Circular massage to the abdomen to promote peristalic motion of the intestines then educated patient on how to perform at home Manual work to bilateral diaphragm to improve movement Scar tissue mobilization: Manual work to the c-section scar with lifting and moving through the restrictions. Pulling up of the scar to elongate. Myofascial release: Fascial release of the lower abdomen to release around the colon Fascial release around the umbilicus to elongate the tissue and help stool move through Neuromuscular re-education: Down training: Educated patient on urge to void and ways to deter her thought about the urge by walking in different directions, walk backwards, side step and make circles. Had her stop what she is doing to take control of her pelvic floor to contract and hold back stool.  Therapeutic activities: Functional strengthening activities: Educated patient on toileting to add in hip movements to relax the pelvic floor to push the stool out   PATIENT EDUCATION: 11/06/23 Education details: Access Code: [email or web address] Person  educated: Patient Education method: Explanation, Demonstration, Tactile cues, Verbal cues, and Handouts Education comprehension: verbalized understanding, returned demonstration, verbal cues required, tactile cues required, and needs further education   HOME EXERCISE PROGRAM: 11/06/23 Access Code: [email or web address] URL: [email or web address] Date: 11/06/2023 Prepared by: Eulis Foster  Program Notes do 3 days of  your yoga sequence, sun salutation  Exercises - Supine Double Knee to Chest  - 1 x daily - 7 x weekly - 1 sets - 1 reps - 1 min hold - Supine Piriformis Stretch  - 1 x daily - 7 x weekly - 1 sets - 1 reps - 30 sec hold - Supine Piriformis Stretch with Leg Straight  - 1 x daily - 7 x weekly - 1 sets - 1 reps - 30 sec hold - Seated Pelvic Floor Contraction  - 3 x daily - 7 x weekly - 1 sets - 10 reps - 10 sec hold - Seated March with Resistance  - 1 x daily - 3 x weekly - 3 sets - 10 reps - Seated Pelvic Floor Contraction with Hip Abduction and Resistance Loop  - 1 x daily - 3 x weekly - 3 sets - 10 reps - Seated Knee Extension with Resistance  - 1 x daily - 3 x weekly - 3 sets - 10 reps  Access Code: [email or web address]  URL: [email or web address] Date: 11/24/2023 Prepared by: Claude Manges  Exercises - Seated Hip Flexion March with Ankle Weights  - 1 x daily - 7 x weekly - 2 sets - 10 reps - Seated Long Arc Quad with Ankle Weight  - 1 x daily - 7 x weekly - 2 sets - 10 reps - Sit to Stand  - 1 x daily - 7 x weekly - 1 sets - 10 reps - Seated Hip Abduction with Resistance  - 1 x daily - 7 x weekly - 2 sets - 10 reps - Supine Lower Trunk Rotation  - 1 x daily - 7 x weekly - 1 sets - 10 reps - 5 hold - Seated Hamstring Stretch  - 1 x daily - 7 x weekly - 2 sets - 30 hold - Hip Flexion Stretch  - 1 x daily - 7 x weekly - 2 sets - 20 hold - Supine Hamstring Stretch with Strap  - 1 x daily - 7 x weekly - 2 sets - 30 hold - Standing Anti-Rotation Press with Anchored  Resistance  - 1 x daily - 7 x weekly - 2 sets - 10 reps - Standing Diagonal Chop  - 1 x daily - 7 x weekly - 2 sets - 10 reps - Seated Upper Trapezius Stretch  - 1 x daily - 7 x weekly - 2 sets - 10 reps - Seated Levator Scapulae Stretch  - 1 x daily - 7 x weekly - 2 sets - 10 reps  ASSESSMENT:  CLINICAL IMPRESSION:  Tunesia has made great improvements since starting therapy. She verbalized more confidence in her balance and improved overall strength. Patient is still having issues with emptying her bowels. She will sit on the commode and small amount or none of the stool comes out. Patient has urgency but able to get to the bathroom in time. Patient has not had stool leakage for several weeks.  Patient has used the urge to void technique and it helped with bowel movement. Patient is wearing her pessary and not feeling the bladder come out. She does not have to urinate multiple times anymore. Patient is not having urinary leakage. She is able to stand on one leg and tandem stance for longer periods of time.   OBJECTIVE IMPAIRMENTS: decreased activity tolerance, decreased coordination, decreased strength, and increased fascial restrictions.   ACTIVITY LIMITATIONS: continence, toileting, and locomotion level  PARTICIPATION LIMITATIONS: community activity  PERSONAL FACTORS: Age and 1 comorbidity: Fibromalgia; cesarean section  are also affecting patient's functional outcome.   REHAB POTENTIAL: Excellent  CLINICAL DECISION MAKING: Stable/uncomplicated  EVALUATION COMPLEXITY: Low   GOALS: Goals reviewed with patient? Yes  SHORT TERM GOALS: Target date: 11/20/23  Patient independent with initial HEP for pelvic floor and balance exercises.  Baseline: Goal status: Met 11/06/23  2.  Patient is able to go from sit to stand </= 18 sec.  Baseline:  Goal status: Met 11/24/2023   3.  Patient educated on ways to splint the perineal area to improve the expulsion of her stool due to the  posterior wall weakness.  Baseline:  Goal status: Met 10/30/23  4.  Patient educated on the urge to void so she is able to walk slowly to the bathroom without leaking urine.  Baseline:  Goal status: Met 11/13/23   LONG TERM GOALS: Target date: 12/18/23  Patient independent with advanced HEP for pelvic floor, core and balance exercises.  Baseline:  Goal status: Met 11/25/23  2.  Patient  reports her confidence of not falling has improved >/= 80% when walking quickly to the bathroom due to sit to stand </= 13 sec. Baseline:  Goal status: Met 11/24/2023  3.  Patient reports her urinary leakage </= 75% due to improve pelvic floor strength >/= 3/5 holding for 5 seconds vaginally.  Baseline:  Goal status: Met 11/25/23  4.  Rectal strength >/= 4/5 holding for 40 seconds to reduce the chance of fecal leakage.  Baseline: No stool leakage for several weeks Goal status: Partially met    PLAN: Discharge to HEP   Eulis Foster, PT 11/25/23 12:14 PM   PHYSICAL THERAPY DISCHARGE SUMMARY  Visits from Start of Care: 11  Current functional level related to goals / functional outcomes: See above.    Remaining deficits: See above.    Education / Equipment: HEP   Patient agrees to discharge. Patient goals were met. Patient is being discharged due to meeting the stated rehab goals. Thank you for the referral.   Eulis Foster, PT 11/25/23 12:14 PM

## 2023-12-01 ENCOUNTER — Encounter: Payer: 59 | Admitting: Physical Therapy

## 2023-12-09 ENCOUNTER — Telehealth: Payer: Self-pay | Admitting: Obstetrics and Gynecology

## 2023-12-09 NOTE — Telephone Encounter (Signed)
Called and spoke to patient. Encouraged her to leave her pessary out for at least the night and to use estrogen for the rest of the week. We discussed that she may have some skin irritation related to the pessary. We discussed insertion and removal strategies as well as her coming in for an appointment in January. Patient will be scheduled on January 2nd for an appointment as she is currently on holiday.

## 2023-12-09 NOTE — Telephone Encounter (Signed)
Says she is in [place] right now.  Having pain with her pessary.  Not sure what to do right now.  In tears.  She said she will be back on the 30th, but wanted some advice right now.

## 2023-12-25 ENCOUNTER — Encounter: Payer: Self-pay | Admitting: Nurse Practitioner

## 2023-12-25 ENCOUNTER — Encounter: Payer: Self-pay | Admitting: Obstetrics and Gynecology

## 2023-12-25 ENCOUNTER — Other Ambulatory Visit: Payer: Self-pay | Admitting: Nurse Practitioner

## 2023-12-25 ENCOUNTER — Ambulatory Visit: Payer: 59 | Admitting: Obstetrics and Gynecology

## 2023-12-25 VITALS — BP 108/71 | HR 82

## 2023-12-25 DIAGNOSIS — R351 Nocturia: Secondary | ICD-10-CM

## 2023-12-25 DIAGNOSIS — N812 Incomplete uterovaginal prolapse: Secondary | ICD-10-CM

## 2023-12-25 DIAGNOSIS — N816 Rectocele: Secondary | ICD-10-CM

## 2023-12-25 DIAGNOSIS — F411 Generalized anxiety disorder: Secondary | ICD-10-CM

## 2023-12-25 DIAGNOSIS — R350 Frequency of micturition: Secondary | ICD-10-CM

## 2023-12-25 DIAGNOSIS — N952 Postmenopausal atrophic vaginitis: Secondary | ICD-10-CM

## 2023-12-25 DIAGNOSIS — N8110 Cystocele, unspecified: Secondary | ICD-10-CM

## 2023-12-25 NOTE — Progress Notes (Signed)
 [facility]   Subjective:     Chief Complaint:  Chief Complaint  Patient presents with   Pessary Check    Betty Holmes is a 68 y.o. female is here for pessary check.   History of Present Illness: Betty Holmes is a 68 y.o. female with stage II pelvic organ prolapse who presents for a pessary check. She is using a size #5 [place] pessary. The pessary has been working well and she has no complaints. She is using vaginal estrogen. She denies vaginal bleeding.  Patient has been out of the country and had a difficult time with her pessary feeling pokey and was very uncomfortable during her trip.  When she called we discussed using the estrogen cream nightly for a week to help get the tissues more supported as she had been on a long plane ride and it could have caused some friction and irritation.  She reports that with the estrogen cream she was better within 48 hours.   Past Medical History: Patient  has a past medical history of Anxiety, Arthritis, At high risk for tick borne illness, Depression, Fibromyalgia, and Fibromyalgia.   Past Surgical History: She  has a past surgical history that includes Cesarean section; Knee surgery; Ovarian cyst removal; Knee arthroscopy; Cyst excision (Right, 05/09/2017); Joint replacement; and cataracts removal (bilateral) (Bilateral).   Medications: She has a current medication list which includes the following prescription(s): clotrimazole , hydroxyzine , meclizine , clobetasol  propionate e, and imvexxy  starter pack.   Allergies: Patient is allergic to penicillins and mineral oil.   Social History: Patient  reports that she has never smoked. She has never used smokeless tobacco. She reports that she does not drink alcohol and does not use drugs.      Objective:    Physical Exam: BP 108/71   Pulse 82  Gen: No apparent distress, A&O x 3. Detailed Urogynecologic Evaluation:  Pelvic Exam: Normal external female genitalia;  Bartholin's and Skene's glands normal in appearance; urethral meatus normal in appearance, no urethral masses or discharge. The pessary was noted to be in place. It was removed and cleaned. Speculum exam revealed no lesions in the vagina. The pessary was replaced. It was comfortable to the patient and fit well.    Assessment/Plan:    Assessment: Betty Holmes is a 68 y.o. with stage II pelvic organ prolapse here for a pessary check. She is doing well.  Plan: She will remove once a month . She will continue to use estrogen. She will follow-up in 6 months for a pessary check or sooner as needed.   For patient's overactive bladder we discussed options including the medication versus pumpkin seed extract.  She has already done physical therapy and reports this has helped somewhat but she is still up sometimes 3-4 times at night which is interrupting to her sleep.  Patient is more holistic in nature so we discussed doing 5 g of pumpkin seed extract in the form of capsules or gummy.  We discussed that you could find that he is up at naturopathic stores and online on [employer].  I do not have a specific brand that I would recommend just the strength and not to use anything with saw palmetto.  We discussed that if this is not effective for her we could consider doing one of the medications including Myrbetriq or Gemtesa.  Would not recommend an anticholinergic medication based on her age and risk of side effects including brain fog, memory impairment, and constipation.  All  questions were answered.

## 2023-12-25 NOTE — Patient Instructions (Addendum)
 You can go back to just using the estrogen cream twice a week and then in between doing the coconut or vitamin E cream.   For the overactive bladder please try the pumpkin seed extract 5gm (5000mg ) daily. If this does not work for you we can start a medication.

## 2023-12-31 ENCOUNTER — Ambulatory Visit: Payer: 59 | Admitting: Nurse Practitioner

## 2024-01-05 ENCOUNTER — Ambulatory Visit: Payer: 59 | Admitting: Nurse Practitioner

## 2024-01-08 ENCOUNTER — Encounter: Payer: 59 | Admitting: Nurse Practitioner

## 2024-01-08 DIAGNOSIS — H905 Unspecified sensorineural hearing loss: Secondary | ICD-10-CM | POA: Diagnosis not present

## 2024-02-19 DIAGNOSIS — R109 Unspecified abdominal pain: Secondary | ICD-10-CM | POA: Diagnosis not present

## 2024-02-19 DIAGNOSIS — N3000 Acute cystitis without hematuria: Secondary | ICD-10-CM | POA: Diagnosis not present

## 2024-02-19 DIAGNOSIS — K5792 Diverticulitis of intestine, part unspecified, without perforation or abscess without bleeding: Secondary | ICD-10-CM | POA: Diagnosis not present

## 2024-02-19 DIAGNOSIS — R1030 Lower abdominal pain, unspecified: Secondary | ICD-10-CM | POA: Diagnosis not present

## 2024-02-19 DIAGNOSIS — R197 Diarrhea, unspecified: Secondary | ICD-10-CM | POA: Diagnosis not present

## 2024-02-26 ENCOUNTER — Encounter: Payer: Self-pay | Admitting: Nurse Practitioner

## 2024-03-17 ENCOUNTER — Other Ambulatory Visit: Payer: 59

## 2024-03-22 ENCOUNTER — Other Ambulatory Visit: Payer: Self-pay | Admitting: Nurse Practitioner

## 2024-03-22 DIAGNOSIS — F411 Generalized anxiety disorder: Secondary | ICD-10-CM

## 2024-03-22 NOTE — Telephone Encounter (Signed)
 Medication: Hydroxyzine 25 mg  Directions: Take 1 tablet by mouth daily as needed for anxiety or itching   Last given: 12/26/23 Number refills: 0 Last o/v: 07/24/23 (acute)  Follow up: as needed if symptoms worsen or fail  Labs: 07/24/23

## 2024-04-09 ENCOUNTER — Encounter: Payer: Self-pay | Admitting: Physical Therapy

## 2024-04-13 ENCOUNTER — Encounter: Payer: Self-pay | Admitting: Nurse Practitioner

## 2024-04-13 ENCOUNTER — Ambulatory Visit (INDEPENDENT_AMBULATORY_CARE_PROVIDER_SITE_OTHER): Admitting: Nurse Practitioner

## 2024-04-13 VITALS — BP 118/76 | HR 59 | Temp 97.2°F | Ht 63.0 in | Wt 191.2 lb

## 2024-04-13 DIAGNOSIS — M797 Fibromyalgia: Secondary | ICD-10-CM

## 2024-04-13 DIAGNOSIS — F411 Generalized anxiety disorder: Secondary | ICD-10-CM

## 2024-04-13 DIAGNOSIS — K5790 Diverticulosis of intestine, part unspecified, without perforation or abscess without bleeding: Secondary | ICD-10-CM | POA: Diagnosis not present

## 2024-04-13 DIAGNOSIS — E559 Vitamin D deficiency, unspecified: Secondary | ICD-10-CM | POA: Diagnosis not present

## 2024-04-13 DIAGNOSIS — K5909 Other constipation: Secondary | ICD-10-CM

## 2024-04-13 DIAGNOSIS — N3946 Mixed incontinence: Secondary | ICD-10-CM

## 2024-04-13 DIAGNOSIS — F3341 Major depressive disorder, recurrent, in partial remission: Secondary | ICD-10-CM

## 2024-04-13 LAB — BASIC METABOLIC PANEL WITH GFR
BUN: 21 mg/dL (ref 6–23)
CO2: 29 meq/L (ref 19–32)
Calcium: 9.3 mg/dL (ref 8.4–10.5)
Chloride: 104 meq/L (ref 96–112)
Creatinine, Ser: 0.56 mg/dL (ref 0.40–1.20)
GFR: 93.06 mL/min (ref >60.00)
Glucose, Bld: 93 mg/dL (ref 70–99)
Potassium: 4.2 meq/L (ref 3.5–5.1)
Sodium: 140 meq/L (ref 135–145)

## 2024-04-13 LAB — VITAMIN D 25 HYDROXY (VIT D DEFICIENCY, FRACTURES): VITD: 35.03 ng/mL (ref 30.00–100.00)

## 2024-04-13 MED ORDER — MELOXICAM 7.5 MG PO TABS: 7.5000 mg | ORAL_TABLET | Freq: Every day | ORAL | 5 refills | Status: DC

## 2024-04-13 NOTE — Progress Notes (Signed)
 Established Patient Visit  Patient: Betty Holmes   DOB: 03/31/1956   68 y.o. Female  MRN: [id number] Visit Date: 04/13/2024  Subjective:    Chief Complaint  Patient presents with   Follow-up    Patient states having a lot of crazy issues.    HPI Fibromyalgia Chronic joint pain and stiffness, worse with activity, no joint swelling or erythema Unable to tolerate the following med in the past: Cymbalta caused increased anxiety and insomnia, Lyrica  caused withdrawal symptoms when dose was missed, and Gabapentin caused headache She is not interested in use of any SSRI or lyrica  or TCA.  She agreed to take Mobic  7.5mg . rx sent. Advised to also use tylenol  500mg  every 8hrs prn, and incorporate daily low impact exercise Advised to avoid OVER THE COUNTER NSAIDs. F/up in 32month  Diverticulosis Per Ct ABDOMEN/pelvis 01/2024 Eval and treatment by ED provider: bactrim  prescribed. Today she states symptoms have resolved  Constipation, chronic Resolved diarrhea. Now has constipation without use of senna tea daily. No improvement with  high fiber diet. Admits to 30oz of water daily and no exercise.  Advised to switch senna to miralax 17g daily, increase daily water intake to 60oz daily, and maintain high fiber diet.  Recurrent major depressive disorder, in partial remission (HCC) Declined use of any SSRI or SNRI Current use of vistaril  prn  Vitamin D  deficiency Repeat vit D  Urinary incontinence, mixed Resume pelvic floor rehab and schedule f/up with urogyn  Reviewed medical, surgical, and social history today  Medications: Outpatient Medications Prior to Visit  Medication Sig   hydrOXYzine  (ATARAX ) 25 MG tablet TAKE 1 TABLET (25 MG TOTAL) BY MOUTH DAILY AS NEEDED FOR ANXIETY OR ITCHING.   [DISCONTINUED] Estradiol  Starter Pack (IMVEXXY  STARTER PACK) 4 MCG INST Place 1 Application vaginally Nightly.   [DISCONTINUED] Clobetasol  Prop Emollient Base (CLOBETASOL   PROPIONATE E) 0.05 % emollient cream Apply 1 Application topically at bedtime.   [DISCONTINUED] clotrimazole  (LOTRIMIN ) 1 % cream Apply topically 2 (two) times daily. (Patient not taking: Reported on 04/13/2024)   [DISCONTINUED] meclizine  (ANTIVERT ) 12.5 MG tablet Take 1 tablet (12.5 mg total) by mouth 2 (two) times daily as needed for dizziness (do not use for more than 3days). (Patient not taking: Reported on 04/13/2024)   No facility-administered medications prior to visit.   Reviewed past medical and social history.   ROS per HPI above      Objective:  BP 118/76   Pulse (!) 59   Temp (!) 97.2 F (36.2 C) (Temporal)   Ht 5\' 3"  (1.6 m)   Wt 191 lb 3.2 oz (86.7 kg)   SpO2 98%   BMI 33.87 kg/m      Physical Exam Vitals and nursing note reviewed.  Cardiovascular:     Rate and Rhythm: Normal rate.     Pulses: Normal pulses.  Pulmonary:     Effort: Pulmonary effort is normal.  Musculoskeletal:        General: Tenderness present. No swelling.     Right lower leg: No edema.     Left lower leg: No edema.  Skin:    General: Skin is warm and dry.     Findings: No rash.  Neurological:     Mental Status: She is alert and oriented to person, place, and time.  Psychiatric:        Attention and Perception: Attention normal.  Mood and Affect: Mood is anxious.        Speech: Speech normal.        Behavior: Behavior is cooperative.        Thought Content: Thought content normal.        Cognition and Memory: Cognition and memory normal.     No results found for any visits on 04/13/24.    Assessment & Plan:    Problem List Items Addressed This Visit     Constipation, chronic   Resolved diarrhea. Now has constipation without use of senna tea daily. No improvement with  high fiber diet. Admits to 30oz of water daily and no exercise.  Advised to switch senna to miralax 17g daily, increase daily water intake to 60oz daily, and maintain high fiber diet.      Diverticulosis    Per Ct ABDOMEN/pelvis 01/2024 Eval and treatment by ED provider: bactrim  prescribed. Today she states symptoms have resolved      Fibromyalgia - Primary   Chronic joint pain and stiffness, worse with activity, no joint swelling or erythema Unable to tolerate the following med in the past: Cymbalta caused increased anxiety and insomnia, Lyrica  caused withdrawal symptoms when dose was missed, and Gabapentin caused headache She is not interested in use of any SSRI or lyrica  or TCA.  She agreed to take Mobic  7.5mg . rx sent. Advised to also use tylenol  500mg  every 8hrs prn, and incorporate daily low impact exercise Advised to avoid OVER THE COUNTER NSAIDs. F/up in 19month      Relevant Medications   meloxicam  (MOBIC ) 7.5 MG tablet   Other Relevant Orders   Basic metabolic panel with GFR   GAD (generalized anxiety disorder)   Recurrent major depressive disorder, in partial remission (HCC)   Declined use of any SSRI or SNRI Current use of vistaril  prn      Urinary incontinence, mixed   Resume pelvic floor rehab and schedule f/up with urogyn      Relevant Orders   AMB referral to rehabilitation   Vitamin D  deficiency   Repeat vit D      Relevant Orders   VITAMIN D  25 Hydroxy (Vit-D Deficiency, Fractures)   Return in about 4 weeks (around 05/11/2024) for fibromyalgia.     Kathrene Parents, NP

## 2024-04-13 NOTE — Assessment & Plan Note (Signed)
 Per Ct ABDOMEN/pelvis 01/2024 Eval and treatment by ED provider: bactrim  prescribed. Today she states symptoms have resolved

## 2024-04-13 NOTE — Assessment & Plan Note (Signed)
 Resolved diarrhea. Now has constipation without use of senna tea daily. No improvement with  high fiber diet. Admits to 30oz of water daily and no exercise.  Advised to switch senna to miralax 17g daily, increase daily water intake to 60oz daily, and maintain high fiber diet.

## 2024-04-13 NOTE — Assessment & Plan Note (Addendum)
 Chronic joint pain and stiffness, worse with activity, no joint swelling or erythema Unable to tolerate the following med in the past: Cymbalta caused increased anxiety and insomnia, Lyrica  caused withdrawal symptoms when dose was missed, and Gabapentin caused headache She is not interested in use of any SSRI or lyrica  or TCA.  She agreed to take Mobic  7.5mg . rx sent. Advised to also use tylenol  500mg  every 8hrs prn, and incorporate daily low impact exercise Advised to avoid OVER THE COUNTER NSAIDs. F/up in 68month

## 2024-04-13 NOTE — Assessment & Plan Note (Signed)
 Declined use of any SSRI or SNRI Current use of vistaril  prn

## 2024-04-13 NOTE — Assessment & Plan Note (Signed)
 Repeat vit D

## 2024-04-13 NOTE — Patient Instructions (Signed)
 Go to lab Maintain Heart healthy diet and daily exercise. Avoid OVER THE COUNTER NSAIDs while taking mobic  7.5mg  daily. Take with food. Ok to take tylenol  500mg  2-3x/day

## 2024-04-13 NOTE — Assessment & Plan Note (Signed)
 Resume pelvic floor rehab and schedule f/up with urogyn

## 2024-04-14 ENCOUNTER — Encounter: Payer: Self-pay | Admitting: Nurse Practitioner

## 2024-04-15 ENCOUNTER — Ambulatory Visit: Admitting: Physical Therapy

## 2024-04-15 ENCOUNTER — Encounter: Payer: Self-pay | Admitting: Nurse Practitioner

## 2024-04-15 DIAGNOSIS — M4802 Spinal stenosis, cervical region: Secondary | ICD-10-CM

## 2024-04-15 DIAGNOSIS — M545 Low back pain, unspecified: Secondary | ICD-10-CM

## 2024-04-16 ENCOUNTER — Encounter: Payer: Self-pay | Admitting: Nurse Practitioner

## 2024-04-20 ENCOUNTER — Encounter: Payer: Self-pay | Admitting: Obstetrics and Gynecology

## 2024-04-20 NOTE — Telephone Encounter (Signed)
 Patient is scheduled to come in 04/23/24

## 2024-04-20 NOTE — Telephone Encounter (Signed)
 Called patient to get her scheduled for an office appointment. She stated that she has an office appointment and X-ray scheduled with [facility] and did not understand why I would be calling her to schedule an appointment. I explained that per her message that she was having pain in her left hip and feet that is is becoming difficult to walk and that per Soyla Duverney she needed to come into the office to be see. I further explained that shince she already has an appointment to be seen and an X-ray appointment that she can keep those and to please have the provider form [facility] to fax [place] the office note and X-ray results. She asked if Soyla Duverney could change the referral for Physical Therapy (PT) from back to hip and feet. She asked how soon should she have her bone density test? It was originally scheduled for March and she had to cancel it due to not being in town and the next available is not until November. Asked if that was to far out? Wants provider advise.

## 2024-04-21 NOTE — Telephone Encounter (Signed)
 Patient returned my call. I informed her of [name]'s comments. She stated that she will speak with [facility] doctor and ask about the PT. She thanked me for calling her.

## 2024-04-21 NOTE — Telephone Encounter (Signed)
 Left a voice message per DPR asking to give me a call back at the office at [phone].

## 2024-04-23 ENCOUNTER — Encounter: Payer: Self-pay | Admitting: Obstetrics and Gynecology

## 2024-04-23 ENCOUNTER — Ambulatory Visit (INDEPENDENT_AMBULATORY_CARE_PROVIDER_SITE_OTHER): Admitting: Obstetrics and Gynecology

## 2024-04-23 ENCOUNTER — Other Ambulatory Visit (HOSPITAL_COMMUNITY)
Admission: RE | Admit: 2024-04-23 | Discharge: 2024-04-23 | Disposition: A | Discharge status: Home / Self Care | Source: Ambulatory Visit | Attending: Obstetrics and Gynecology | Admitting: Obstetrics and Gynecology

## 2024-04-23 VITALS — BP 117/73 | HR 69

## 2024-04-23 DIAGNOSIS — R350 Frequency of micturition: Secondary | ICD-10-CM | POA: Diagnosis not present

## 2024-04-23 DIAGNOSIS — N816 Rectocele: Secondary | ICD-10-CM

## 2024-04-23 DIAGNOSIS — N8110 Cystocele, unspecified: Secondary | ICD-10-CM | POA: Diagnosis not present

## 2024-04-23 DIAGNOSIS — N898 Other specified noninflammatory disorders of vagina: Secondary | ICD-10-CM | POA: Insufficient documentation

## 2024-04-23 DIAGNOSIS — R238 Other skin changes: Secondary | ICD-10-CM | POA: Diagnosis not present

## 2024-04-23 DIAGNOSIS — R300 Dysuria: Secondary | ICD-10-CM | POA: Diagnosis not present

## 2024-04-23 DIAGNOSIS — R319 Hematuria, unspecified: Secondary | ICD-10-CM | POA: Diagnosis not present

## 2024-04-23 DIAGNOSIS — R3915 Urgency of urination: Secondary | ICD-10-CM | POA: Diagnosis not present

## 2024-04-23 LAB — POCT URINALYSIS DIPSTICK
Bilirubin, UA: NEGATIVE
Blood, UA: NEGATIVE
Glucose, UA: NEGATIVE
Ketones, UA: NEGATIVE
Leukocytes, UA: NEGATIVE
Nitrite, UA: NEGATIVE
Protein, UA: NEGATIVE
Spec Grav, UA: 1.015 (ref 1.010–1.025)
Urobilinogen, UA: 0.2 U/dL
pH, UA: 6 (ref 5.0–8.0)

## 2024-04-23 MED ORDER — CLOBETASOL PROPIONATE E 0.05 % EX CREA: 1.0000 | TOPICAL_CREAM | Freq: Every evening | CUTANEOUS | 2 refills | Status: AC

## 2024-04-23 NOTE — Patient Instructions (Addendum)
 Continue estrogen x2 weekly.   We will call you with culture results and with the swab results

## 2024-04-26 ENCOUNTER — Encounter: Payer: Self-pay | Admitting: Obstetrics and Gynecology

## 2024-04-26 ENCOUNTER — Ambulatory Visit (INDEPENDENT_AMBULATORY_CARE_PROVIDER_SITE_OTHER): Payer: 59

## 2024-04-26 DIAGNOSIS — Z0000 Encounter for general adult medical examination without abnormal findings: Secondary | ICD-10-CM | POA: Diagnosis not present

## 2024-04-26 LAB — CERVICOVAGINAL ANCILLARY ONLY
Bacterial Vaginitis (gardnerella): NEGATIVE
Candida Glabrata: NEGATIVE
Candida Vaginitis: NEGATIVE
Comment: NEGATIVE
Comment: NEGATIVE
Comment: NEGATIVE

## 2024-04-26 NOTE — Progress Notes (Signed)
 Subjective:   Betty Holmes is a 68 y.o. who presents for a Medicare Wellness preventive visit.  Visit Complete: Virtual I connected with  Sivi Goodbar Tull on 04/26/24 by a audio enabled telemedicine application and verified that I am speaking with the correct person using two identifiers.  Patient Location: Home  Provider Location: Office/Clinic  I discussed the limitations of evaluation and management by telemedicine. The patient expressed understanding and agreed to proceed.  Vital Signs: Because this visit was a virtual/telehealth visit, some criteria may be missing or patient reported. Any vitals not documented were not able to be obtained and vitals that have been documented are patient reported.  VideoError- Librarian, academic were attempted between this provider and patient, however failed, due to patient having technical difficulties OR patient did not have access to video capability.  We continued and completed visit with audio only.   Persons Participating in Visit: Patient.  AWV Questionnaire: Yes: Patient Medicare AWV questionnaire was completed by the patient on 04/22/2024; I have confirmed that all information answered by patient is correct and no changes since this date.  Cardiac Risk Factors include: advanced age (>7men, >67 women)     Objective:    Today's Vitals   04/26/24 1008  PainSc: 3    There is no height or weight on file to calculate BMI.     04/26/2024   10:14 AM 10/23/2023    8:40 AM 04/25/2023   10:16 AM 02/05/2022    8:37 AM 08/02/2020    3:05 PM 06/22/2020    9:31 AM 12/02/2019    9:59 AM  Advanced Directives  Does Patient Have a Medical Advance Directive? No No No No No No No  Would patient like information on creating a medical advance directive? No - Patient declined No - Patient declined  No - Patient declined Yes (MAU/Ambulatory/Procedural Areas - Information given) No - Patient declined No - Patient declined     Current Medications (verified) Outpatient Encounter Medications as of 04/26/2024  Medication Sig   Clobetasol  Prop Emollient Base (CLOBETASOL  PROPIONATE E) 0.05 % emollient cream Apply 1 Application topically at bedtime.   hydrOXYzine  (ATARAX ) 25 MG tablet TAKE 1 TABLET (25 MG TOTAL) BY MOUTH DAILY AS NEEDED FOR ANXIETY OR ITCHING.   meloxicam  (MOBIC ) 7.5 MG tablet Take 1 tablet (7.5 mg total) by mouth daily. With food   No facility-administered encounter medications on file as of 04/26/2024.    Allergies (verified) Penicillins and Mineral oil   History: Past Medical History:  Diagnosis Date   Anxiety    Arthritis    At high risk for tick borne illness    had abx--test her again--she was okey---[facility]   Depression    Fibromyalgia    Fibromyalgia    Past Surgical History:  Procedure Laterality Date   cataracts removal (bilateral) Bilateral    CESAREAN SECTION     CYST EXCISION Right 05/09/2017   Procedure: EXCISION RIGHT INGUINAL CYST;  Surgeon: Adalberto Acton, MD;  Location: [facility];  Service: General;  Laterality: Right;   JOINT REPLACEMENT     KNEE ARTHROSCOPY     KNEE SURGERY     OVARIAN CYST REMOVAL     Family History  Problem Relation Age of Onset   Alzheimer's disease Mother    Heart disease Father    Alzheimer's disease Maternal Aunt    Alzheimer's disease Maternal Uncle    Alzheimer's disease Maternal Grandmother  Social History   Socioeconomic History   Marital status: Married    Spouse name: Brennen Bringer   Number of children: 4   Years of education: Not on file   Highest education level: GED or equivalent  Occupational History   Occupation: Retired  Tobacco Use   Smoking status: Never   Smokeless tobacco: Never  Vaping Use   Vaping status: Never Used  Substance and Sexual Activity   Alcohol use: No   Drug use: No   Sexual activity: Not Currently    Birth control/protection: Post-menopausal  Other  Topics Concern   Not on file  Social History Narrative   Not on file   Social Drivers of Health   Financial Resource Strain: Low Risk  (04/26/2024)   Overall Financial Resource Strain (CARDIA)    Difficulty of Paying Living Expenses: Not hard at all  Food Insecurity: No Food Insecurity (04/26/2024)   Hunger Vital Sign    Worried About Running Out of Food in the Last Year: Never true    Ran Out of Food in the Last Year: Never true  Transportation Needs: No Transportation Needs (04/26/2024)   PRAPARE - Administrator, Civil Service (Medical): No    Lack of Transportation (Non-Medical): No  Physical Activity: Inactive (04/26/2024)   Exercise Vital Sign    Days of Exercise per Week: 0 days    Minutes of Exercise per Session: 0 min  Stress: No Stress Concern Present (04/26/2024)   [employer] of Occupational Health - Occupational Stress Questionnaire    Feeling of Stress : Not at all  Social Connections: Moderately Integrated (04/26/2024)   Social Connection and Isolation Panel [NHANES]    Frequency of Communication with Friends and Family: Three times a week    Frequency of Social Gatherings with Friends and Family: Never    Attends Religious Services: More than 4 times per year    Active Member of [employer] or Organizations: No    Attends Engineer, structural: Never    Marital Status: Married    Tobacco Counseling Counseling given: Not Answered    Clinical Intake:  Pre-visit preparation completed: Yes  Pain : 0-10 Pain Score: 3  Pain Type: Acute pain Pain Location: Hip Pain Orientation: Left Pain Descriptors / Indicators: Aching Pain Onset: More than a month ago Pain Frequency: Constant     Nutritional Risks: None Diabetes: No  Lab Results  Component Value Date   HGBA1C 4.8 10/08/2021   HGBA1C 5.9 08/22/2021   HGBA1C 5.4 05/11/2020     How often do you need to have someone help you when you read instructions, pamphlets, or other written  materials from your doctor or pharmacy?: 1 - Never  Interpreter Needed?: No  Information entered by :: NAllen LPN   Activities of Daily Living     04/25/2024    4:56 PM  In your present state of health, do you have any difficulty performing the following activities:  Hearing? 0  Vision? 0  Difficulty concentrating or making decisions? 0  Walking or climbing stairs? 1  Comment due to hip  Dressing or bathing? 0  Doing errands, shopping? 0  Preparing Food and eating ? N  Using the Toilet? N  In the past six months, have you accidently leaked urine? Y  Comment sees uro gyn  Do you have problems with loss of bowel control? Y  Managing your Medications? N  Managing your Finances? N  Housekeeping or managing your Housekeeping?  N    Patient Care Team: Nche, Connye Delaine, NP as PCP - General (Internal Medicine)  Indicate any recent Medical Services you may have received from other than [facility] providers in the past year (date may be approximate).     Assessment:   This is a routine wellness examination for [place].  Hearing/Vision screen Hearing Screening - Comments:: Denies hearing issues Vision Screening - Comments:: Regular eye exams, [facility]   Goals Addressed             This Visit's Progress    Patient Stated       04/26/2024, working on pelvic floor       Depression Screen     04/26/2024   10:15 AM 04/13/2024   11:14 AM 07/24/2023   11:43 AM 04/25/2023   10:17 AM 10/14/2022   11:45 AM 07/02/2022    1:33 PM 02/05/2022    8:38 AM  PHQ 2/9 Scores  PHQ - 2 Score 4 4 4 1  0 5 3  PHQ- 9 Score 11 10 10  3 19 13     Fall Risk     04/25/2024    4:56 PM 04/13/2024   11:14 AM 04/25/2023   10:16 AM 02/05/2022    8:31 AM 08/22/2021    8:54 AM  Fall Risk   Falls in the past year? 1 0 0 0 0  Comment lost balance      Number falls in past yr: 0 0 0 0 0  Injury with Fall? 0 0 0 0 0  Risk for fall due to : Medication side effect;Impaired balance/gait No Fall Risks  Medication side effect  No Fall Risks  Follow up Falls prevention discussed;Falls evaluation completed Falls evaluation completed Falls prevention discussed;Education provided;Falls evaluation completed Falls evaluation completed;Falls prevention discussed Falls evaluation completed    MEDICARE RISK AT HOME:  Medicare Risk at Home Any stairs in or around the home?: (Patient-Rptd) No Adequate lighting in your home to reduce risk of falls?: (Patient-Rptd) Yes Life alert?: (Patient-Rptd) No Use of a cane, walker or w/c?: (Patient-Rptd) No Grab bars in the bathroom?: (Patient-Rptd) No Shower chair or bench in shower?: (Patient-Rptd) No Elevated toilet seat or a handicapped toilet?: (Patient-Rptd) No  TIMED UP AND GO:  Was the test performed?  No  Cognitive Function: 6CIT completed        04/26/2024   10:18 AM 04/25/2023   10:20 AM  6CIT Screen  What Year? 0 points 0 points  What month? 0 points 0 points  What time? 0 points 0 points  Count back from 20 0 points 0 points  Months in reverse 0 points 0 points  Repeat phrase 2 points 0 points  Total Score 2 points 0 points    Immunizations Immunization History  Administered Date(s) Administered   PFIZER(Purple Top)SARS-COV-2 Vaccination 04/27/2020, 05/18/2020, 11/02/2020    Screening Tests Health Maintenance  Topic Date Due   Zoster Vaccines- Shingrix (1 of 2) Never done   COVID-19 Vaccine (4 - 2024-25 season) 08/24/2023   COLON CANCER SCREENING ANNUAL FOBT  08/29/2023   Pneumonia Vaccine 65+ Years old (1 of 1 - PCV) 07/23/2024 (Originally 10/10/2004)   INFLUENZA VACCINE  07/23/2024   Medicare Annual Wellness (AWV)  04/26/2025   Fecal DNA (Cologuard)  08/28/2025   MAMMOGRAM  11/09/2025   DEXA SCAN  Completed   Hepatitis C Screening  Completed   HPV VACCINES  Aged Out   Meningococcal B Vaccine  Aged Out  DTaP/Tdap/Td  Discontinued   Colonoscopy  Discontinued    Health Maintenance  Health Maintenance Due  Topic Date  Due   Zoster Vaccines- Shingrix (1 of 2) Never done   COVID-19 Vaccine (4 - 2024-25 season) 08/24/2023   COLON CANCER SCREENING ANNUAL FOBT  08/29/2023   Health Maintenance Items Addressed: Declines vaccines.  Additional Screening:  Vision Screening: Recommended annual ophthalmology exams for early detection of glaucoma and other disorders of the eye.  Dental Screening: Recommended annual dental exams for proper oral hygiene  Community Resource Referral / Chronic Care Management: CRR required this visit?  No   CCM required this visit?  No     Plan:     I have personally reviewed and noted the following in the patient's chart:   Medical and social history Use of alcohol, tobacco or illicit drugs  Current medications and supplements including opioid prescriptions. Patient is not currently taking opioid prescriptions. Functional ability and status Nutritional status Physical activity Advanced directives List of other physicians Hospitalizations, surgeries, and ER visits in previous 12 months Vitals Screenings to include cognitive, depression, and falls Referrals and appointments  In addition, I have reviewed and discussed with patient certain preventive protocols, quality metrics, and best practice recommendations. A written personalized care plan for preventive services as well as general preventive health recommendations were provided to patient.     Areatha Beecham, LPN   07/24/9561   After Visit Summary: (MyChart) Due to this being a telephonic visit, the after visit summary with patients personalized plan was offered to patient via MyChart   Notes: Nothing significant to report at this time.

## 2024-04-26 NOTE — Patient Instructions (Signed)
 Ms. Betty Holmes , Thank you for taking time to come for your Medicare Wellness Visit. I appreciate your ongoing commitment to your health goals. Please review the following plan we discussed and let me know if I can assist you in the future.   Referrals/Orders/Follow-Ups/Clinician Recommendations: none  This is a list of the screening recommended for you and due dates:  Health Maintenance  Topic Date Due   Zoster (Shingles) Vaccine (1 of 2) Never done   COVID-19 Vaccine (4 - 2024-25 season) 08/24/2023   Stool Blood Test  08/29/2023   Pneumonia Vaccine (1 of 1 - PCV) 07/23/2024*   Flu Shot  07/23/2024   Medicare Annual Wellness Visit  04/26/2025   Cologuard (Stool DNA test)  08/28/2025   Mammogram  11/09/2025   DEXA scan (bone density measurement)  Completed   Hepatitis C Screening  Completed   HPV Vaccine  Aged Out   Meningitis B Vaccine  Aged Out   DTaP/Tdap/Td vaccine  Discontinued   Colon Cancer Screening  Discontinued  *Topic was postponed. The date shown is not the original due date.    Advanced directives: (ACP Link)Information on Advanced Care Planning can be found at [place]  Secretary of [facility] Advance Health Care Directives Advance Health Care Directives. [email or web address]   Next Medicare Annual Wellness Visit scheduled for next year: Yes  Have you seen your provider in the last 6 months (3 months if uncontrolled diabetes)? Yes, will make follow up appointment after seeing orthopedist  insert Preventive Care attachment Insert FALL PREVENTION attachment if needed

## 2024-04-26 NOTE — Progress Notes (Signed)
 [facility] Return Visit  SUBJECTIVE  History of Present Illness: KHALISE BIZZLE is a 68 y.o. female seen in follow-up for proalpse. Patient has been using a pessary and reports when she went to [place]  to fill in for one of the [facility] she had a very difficult time. She reports she has multiple UTIs and had increased vaginal discharge and proctitis.   She reports she would like to try the cube pessary and she will plan to remove and replace it.     Past Medical History: Patient  has a past medical history of Anxiety, Arthritis, At high risk for tick borne illness, Depression, Fibromyalgia, and Fibromyalgia.   Past Surgical History: She  has a past surgical history that includes Cesarean section; Knee surgery; Ovarian cyst removal; Knee arthroscopy; Cyst excision (Right, 05/09/2017); Joint replacement; and cataracts removal (bilateral) (Bilateral).   Medications: She has a current medication list which includes the following prescription(s): clobetasol  propionate e, hydroxyzine , and meloxicam .   Allergies: Patient is allergic to penicillins and mineral oil.   Social History: Patient  reports that she has never smoked. She has never used smokeless tobacco. She reports that she does not drink alcohol and does not use drugs.     OBJECTIVE    Lab Results  Component Value Date   COLORU yellow 04/23/2024   CLARITYU clear 04/23/2024   GLUCOSEUR Negative 04/23/2024   BILIRUBINUR negative 04/23/2024   KETONESU negative 04/23/2024   SPECGRAV 1.015 04/23/2024   RBCUR negative 04/23/2024   PHUR 6.0 04/23/2024   PROTEINUR Negative 04/23/2024   UROBILINOGEN 0.2 04/23/2024   LEUKOCYTESUR Negative 04/23/2024    Physical Exam: Vitals:   04/23/24 1114  BP: 117/73  Pulse: 69   Gen: No apparent distress, A&O x 3.  Detailed Urogynecologic Evaluation:  Patient was fit with a #5 cube pessary (Lot [id number]). She was able to show proper removal and replacement  and reports some mild irritation. The vaginal tissues were mildly irritation and an aptima swab was obtained. External tissues were red and there was signs of itching.      ASSESSMENT AND PLAN    Ms. Gamelin is a 68 y.o. with:  1. Prolapse of anterior vaginal wall   2. Prolapse of posterior vaginal wall   3. Skin irritation   4. Urinary frequency   5. Dysuria   6. Vaginal irritation    Patient fitted with new pessary today. She will plan to take the pessary out nightly and replace during the day.  Encouraged patient to call with concerns if the pessary is bothersome. We can also discuss surgical intervention if this is not working.  Patient given low dose steroid cream to use where the itching was occurring externally.  Urine clear of infection, but due to recent concerns for UTI will send for pathnostics to rule out underlying potential pathogens.  Urine sent for pathnostics.  Aptima swab obtained to rule out yeast or BV. Encouraged patient to use her estrogen cream.   Patient to follow up in 6 weeks or sooner if needed.   Cypher Paule G Donyell Ding, NP

## 2024-04-27 ENCOUNTER — Ambulatory Visit: Admitting: Obstetrics and Gynecology

## 2024-04-29 ENCOUNTER — Encounter: Payer: Self-pay | Admitting: Obstetrics and Gynecology

## 2024-04-30 ENCOUNTER — Encounter: Payer: Self-pay | Admitting: Physical Therapy

## 2024-04-30 ENCOUNTER — Ambulatory Visit: Attending: Nurse Practitioner | Admitting: Physical Therapy

## 2024-04-30 DIAGNOSIS — M545 Low back pain, unspecified: Secondary | ICD-10-CM | POA: Insufficient documentation

## 2024-04-30 DIAGNOSIS — M6281 Muscle weakness (generalized): Secondary | ICD-10-CM | POA: Diagnosis not present

## 2024-04-30 DIAGNOSIS — M353 Polymyalgia rheumatica: Secondary | ICD-10-CM | POA: Insufficient documentation

## 2024-04-30 DIAGNOSIS — M25552 Pain in left hip: Secondary | ICD-10-CM | POA: Insufficient documentation

## 2024-04-30 DIAGNOSIS — M4802 Spinal stenosis, cervical region: Secondary | ICD-10-CM | POA: Diagnosis not present

## 2024-04-30 DIAGNOSIS — G8929 Other chronic pain: Secondary | ICD-10-CM | POA: Diagnosis not present

## 2024-04-30 DIAGNOSIS — R262 Difficulty in walking, not elsewhere classified: Secondary | ICD-10-CM | POA: Diagnosis not present

## 2024-04-30 DIAGNOSIS — M5459 Other low back pain: Secondary | ICD-10-CM | POA: Insufficient documentation

## 2024-04-30 NOTE — Therapy (Signed)
 OUTPATIENT PHYSICAL THERAPY THORACOLUMBAR EVALUATION   Patient Name: Betty Holmes MRN: [id number] DOB:[birth year], 68 y.o., female Today's Date: 04/30/2024  END OF SESSION:  PT End of Session - 04/30/24 0855     Visit Number 1    Number of Visits 16    Date for PT Re-Evaluation 06/25/24    Authorization Type U[employer] Dual complete, MCD    PT Start Time 0850    PT Stop Time 0930    PT Time Calculation (min) 40 min    Behavior During Therapy WFL for tasks assessed/performed             Past Medical History:  Diagnosis Date   Anxiety    Arthritis    At high risk for tick borne illness    had abx--test her again--she was okey---at [facility]   Depression    Fibromyalgia    Fibromyalgia    Past Surgical History:  Procedure Laterality Date   cataracts removal (bilateral) Bilateral    CESAREAN SECTION     CYST EXCISION Right 05/09/2017   Procedure: EXCISION RIGHT INGUINAL CYST;  Surgeon: Adalberto Acton, MD;  Location: [facility];  Service: General;  Laterality: Right;   JOINT REPLACEMENT     KNEE ARTHROSCOPY     KNEE SURGERY     OVARIAN CYST REMOVAL     Patient Active Problem List   Diagnosis Date Noted   Diverticulosis 04/13/2024   Urinary incontinence, mixed 07/15/2023   Constipation, chronic 10/14/2022   Obesity 10/30/2021   S/P cataract extraction 10/30/2021   Candidal intertrigo 08/22/2021   GAD (generalized anxiety disorder) 09/21/2019   Pain in left wrist 07/24/2017   Painful wrist, right 07/24/2017   Osteopenia 04/21/2017   Cervical stenosis of spine 04/21/2017   Vitamin D  deficiency 04/21/2017   Recurrent major depressive disorder, in partial remission (HCC) 04/09/2017   Chronic bilateral low back pain 04/09/2017   Fibromyalgia     PCP: Kandace Organ NP   REFERRING PROVIDER: Kandace Organ NP   REFERRING DIAG:  Diagnosis  M54.50,G89.29 (ICD-10-CM) - Chronic bilateral low back pain without sciatica   M48.02 (ICD-10-CM) - Cervical stenosis of spine    Rationale for Evaluation and Treatment: Rehabilitation  THERAPY DIAG:  Polymyalgia (HCC)  Other low back pain  Difficulty in walking, not elsewhere classified  ONSET DATE: back yrs, L hip March?   SUBJECTIVE:  SUBJECTIVE STATEMENT: Pt has diff lifting L leg into the car, standing  This limits her recreational activities, travelling, sleeping is impacted.  Bending over, cooking.   PERTINENT HISTORY:  Chronic joint pain and stiffness, worse with activity, no joint swelling or erythema Unable to tolerate the following med in the past: Cymbalta caused increased anxiety and insomnia, Lyrica  caused withdrawal symptoms when dose was missed, and Gabapentin caused headache  PAIN:  Are you having pain? Yes: NPRS scale: 6 Pain location: low back and L hip (anterior/groin)  Pain description: stiff, sharp in her L hip  Aggravating factors: activity, standing on LLE, sitting too long, walking gets a bit better then it gets worse (30 min)    Relieving factors: Ibu, BC powder, heating pad    PRECAUTIONS: None  RED FLAGS: None   WEIGHT BEARING RESTRICTIONS: No  FALLS:  Has patient fallen in last 6 months? No She does reports decreased confidence with taking the bus, stepping off curb.   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   NEXT MD VISIT: Sees [facility] for L hip in about 2 weeks   OBJECTIVE:  Note: Objective measures were completed at Evaluation unless otherwise  noted.  DIAGNOSTIC FINDINGS:  None recent   PATIENT SURVEYS:  Modified Oswestry 19/50   COGNITION: Overall cognitive status: Within functional limits for tasks assessed     SENSATION: WFL  MUSCLE LENGTH: Hamstrings: good Thomas test: not measured but L sided stretch addressed painful area   POSTURE: rounded shoulders, forward head, and increased thoracic kyphosis  PALPATION: TTP on Rt lateral hip Pain with palpation to left posterior hip, left rectus femoris and left iliopsoas  LUMBAR ROM:   AROM eval  Flexion WFL  Extension 75% limited   Right lateral flexion WFL  Left lateral flexion WFL  Right rotation WFL  Left rotation WFL   (Blank rows = not tested)  LOWER EXTREMITY ROM:   WNL   Passive  Right eval Left eval  Hip flexion    Hip extension    Hip abduction    Hip adduction    Hip internal rotation    Hip external rotation    Knee flexion    Knee extension    Ankle dorsiflexion    Ankle plantarflexion    Ankle inversion    Ankle eversion     (Blank rows = not tested)  LOWER EXTREMITY MMT:    MMT Right eval Left eval  Hip flexion 4 4-  Hip extension    Hip abduction    Hip adduction    Hip internal rotation    Hip external rotation    Knee flexion 5 5  Knee extension 5 5  Ankle dorsiflexion    Ankle plantarflexion    Ankle inversion    Ankle eversion     (Blank rows = not tested)  LUMBAR SPECIAL TESTS:  Straight leg raise test: Negative  Hip testing: Negative left scour test, positive FABER test for L anterior hip    FUNCTIONAL TESTS:  5 times sit to stand: 20 seconds no UE   GAIT: Distance walked: 100 Assistive device utilized: None Level of assistance: Modified independence Comments: Slow pace slight trunk flexion and antalgic  patterning  TREATMENT DATE:  [facility] Adult PT Treatment:                                                DATE: 04/30/24 Self Care: Relationship of the left anterior hip to the upper lumbar spine,  psoas Home exercise program- hip abduction form  Importance of consistent gentle exercise and not overdoing it when you feel good Variety with exercise ( not swimming 10 days in a row), adding walking, or other cardio        Strengthening versus stretching to relieve pain                                                                                                                           PATIENT EDUCATION:  Education details: see above  Person educated: Patient Education method: Explanation, Demonstration, Verbal cues, and Handouts Education comprehension: verbalized understanding and returned demonstration  HOME EXERCISE PROGRAM: Access Code: [email or web address] URL: [email or web address] Date: 04/30/2024 Prepared by: Marci Setter  Exercises - Modified Thomas Stretch  - 1 x daily - 7 x weekly - 1 sets - 3 reps - 1-3 hold - Supine Bridge  - 1 x daily - 7 x weekly - 2 sets - 10 reps - 5 hold - Sidelying Hip Abduction  - 1 x daily - 7 x weekly - 2 sets - 10 reps - 2 hold - Standing Hip Abduction with Counter Support  - 1 x daily - 7 x weekly - 2 sets - 10 reps - 2 hold  ASSESSMENT:  CLINICAL IMPRESSION: Patient is a 68 y.o. female who was seen today for physical therapy evaluation and treatment for lower back pain, neck pain.  She is also having a significant amount of pain in her left hip which does seem related to her left iliopsoas.  She presents with signs and symptoms consistent with a tendinopathy in her gluteus medius and may have some anterior hip involvement as well.  While she was traveling walking extensively and climbing stairs she aggravated it and it continues to be the most bothersome symptom that she has.  She will be getting an x-ray in the coming week and more of an orthopedic workup.  OBJECTIVE IMPAIRMENTS: Abnormal gait, decreased activity tolerance, decreased balance, decreased mobility, difficulty walking, decreased strength, increased fascial restrictions,  improper body mechanics, obesity, and pain.   ACTIVITY LIMITATIONS: carrying, lifting, bending, sitting, standing, squatting, sleeping, stairs, transfers, bed mobility, locomotion level, and caring for others  PARTICIPATION LIMITATIONS: cleaning, laundry, interpersonal relationship, shopping, community activity, and occupation  PERSONAL FACTORS: Time since onset of injury/illness/exacerbation, Transportation, and 3+ comorbidities: Fibromyalgia, knee surgeries, anxiety are also affecting patient's functional outcome.   REHAB POTENTIAL: Excellent  CLINICAL DECISION MAKING: Evolving/moderate complexity  EVALUATION COMPLEXITY: Moderate   GOALS: Goals reviewed with patient? Yes  SHORT TERM GOALS:  Target date: 05/21/2024   Patient will be able to show independence for initial HEP to include posture, core and hip strength and stability.  Baseline: Goal status: INITIAL  2.  Patient will be able to complete 2 min walk test distance for baseline pain and endurance assessment and goal set  Baseline:  Goal status: INITIAL  3.  Pt will complete balance assessment and set goal  Baseline:  Goal status: INITIAL   LONG TERM GOALS: Target date: 06/25/2024    Patient will be able to improve 2 min walk test distance by 50 feet or more with LRAD and no increase in pain.  Baseline:  Goal status: INITIAL  2.  Patient will be able to demonstrate hip/knee/ankle strength to 5/5 in order to maximize functional mobility, ambulation and lifting.   Baseline:  Goal status: INITIAL  3.  Patient will be independent with final HEP upon discharge from PT and report consistent benefit following exercise completion.   Baseline:  Goal status: INITIAL  4.  Patient will be able to improve 2 min walk test distance by 50 feet or more with LRAD and no increase in pain.  Baseline:  Goal status: INITIAL  5.  Patient will be able to ambulate in the community > 1000 feet with mod I using least restrictive  assistive device and min to no discernable limp, pain managed to 4/10.  Baseline:  Goal status: INITIAL  6.  Pt will demo L hip abduction to > 4/5 or more in order to support her with ambulation, walking  Baseline:  Goal status: INITIAL  PLAN:  PT FREQUENCY: 2x/week  PT DURATION: 8 weeks  PLANNED INTERVENTIONS: 97164- PT Re-evaluation, 97750- Physical Performance Testing, 97110-Therapeutic exercises, 97530- Therapeutic activity, [id number]- Neuromuscular re-education, 97535- Self Care, 52841- Manual therapy, [phone]- Gait training, Patient/Family education, Balance training, Stair training, Taping, Dry Needling, Cryotherapy, and Moist heat.  PLAN FOR NEXT SESSION: Check HEP, 2 min walk, consider DN for L ant hip vs post hip, core stability, NuStep, balance assessment (TUG?)   Leba Tibbitts, PT 04/30/2024, 9:56 AM   Marci Setter, PT 04/30/24 9:56 AM Phone: [phone] Fax: [phone]

## 2024-05-03 ENCOUNTER — Encounter: Payer: Self-pay | Admitting: Physical Therapy

## 2024-05-03 ENCOUNTER — Ambulatory Visit: Admitting: Physical Therapy

## 2024-05-03 DIAGNOSIS — M4802 Spinal stenosis, cervical region: Secondary | ICD-10-CM | POA: Diagnosis not present

## 2024-05-03 DIAGNOSIS — M353 Polymyalgia rheumatica: Secondary | ICD-10-CM | POA: Diagnosis not present

## 2024-05-03 DIAGNOSIS — M25552 Pain in left hip: Secondary | ICD-10-CM | POA: Diagnosis not present

## 2024-05-03 DIAGNOSIS — M545 Low back pain, unspecified: Secondary | ICD-10-CM | POA: Diagnosis not present

## 2024-05-03 DIAGNOSIS — R262 Difficulty in walking, not elsewhere classified: Secondary | ICD-10-CM

## 2024-05-03 DIAGNOSIS — M5459 Other low back pain: Secondary | ICD-10-CM | POA: Diagnosis not present

## 2024-05-03 DIAGNOSIS — G8929 Other chronic pain: Secondary | ICD-10-CM | POA: Diagnosis not present

## 2024-05-03 DIAGNOSIS — M6281 Muscle weakness (generalized): Secondary | ICD-10-CM | POA: Diagnosis not present

## 2024-05-03 NOTE — Therapy (Signed)
 OUTPATIENT PHYSICAL THERAPY THORACOLUMBAR NOTE    Patient Name: Betty Holmes MRN: [id number] DOB:05-04-1956, 68 y.o., female Today's Date: 05/03/2024  END OF SESSION:  PT End of Session - 05/03/24 0859     Visit Number 2    Number of Visits 16    Date for PT Re-Evaluation 06/25/24    Authorization Type UHC Dual complete, MCD    PT Start Time [phone]    PT Stop Time 0930    PT Time Calculation (min) 39 min    Activity Tolerance Patient tolerated treatment well    Behavior During Therapy WFL for tasks assessed/performed             Past Medical History:  Diagnosis Date   Anxiety    Arthritis    At high risk for tick borne illness    had abx--test her again--she was okey---at [facility]   Depression    Fibromyalgia    Fibromyalgia    Past Surgical History:  Procedure Laterality Date   cataracts removal (bilateral) Bilateral    CESAREAN SECTION     CYST EXCISION Right 05/09/2017   Procedure: EXCISION RIGHT INGUINAL CYST;  Surgeon: Adalberto Acton, MD;  Location: [facility];  Service: General;  Laterality: Right;   JOINT REPLACEMENT     KNEE ARTHROSCOPY     KNEE SURGERY     OVARIAN CYST REMOVAL     Patient Active Problem List   Diagnosis Date Noted   Diverticulosis 04/13/2024   Urinary incontinence, mixed 07/15/2023   Constipation, chronic 10/14/2022   Obesity 10/30/2021   S/P cataract extraction 10/30/2021   Candidal intertrigo 08/22/2021   GAD (generalized anxiety disorder) 09/21/2019   Pain in left wrist 07/24/2017   Painful wrist, right 07/24/2017   Osteopenia 04/21/2017   Cervical stenosis of spine 04/21/2017   Vitamin D  deficiency 04/21/2017   Recurrent major depressive disorder, in partial remission (HCC) 04/09/2017   Chronic bilateral low back pain 04/09/2017   Fibromyalgia     PCP: Kandace Organ NP   REFERRING PROVIDER: Kandace Organ NP   REFERRING DIAG:  Diagnosis  M54.50,G89.29 (ICD-10-CM) -  Chronic bilateral low back pain without sciatica  M48.02 (ICD-10-CM) - Cervical stenosis of spine    Rationale for Evaluation and Treatment: Rehabilitation  THERAPY DIAG:  Polymyalgia (HCC)  Other low back pain  Difficulty in walking, not elsewhere classified  Muscle weakness (generalized)  ONSET DATE: back yrs, L hip March?   SUBJECTIVE:  SUBJECTIVE STATEMENT: Patient took Ibuprofen  and it really helps.  Sunday I could barely walk and get out of bed. It takes time for me to walk around normal.  I am getting an XR Wed and I have to cancel.   PERTINENT HISTORY:  Chronic joint pain and stiffness, worse with activity, no joint swelling or erythema Unable to tolerate the following med in the past: Cymbalta caused increased anxiety and insomnia, Lyrica  caused withdrawal symptoms when dose was missed, and Gabapentin caused headache  PAIN:  Are you having pain? Yes: NPRS scale: 4 Pain location: low back and L hip (anterior/groin)  Pain description: stiff, sharp in her L hip  Aggravating factors: activity, standing on LLE, sitting too long, walking gets a bit better then it gets worse (30 min)    Relieving factors: Ibu, BC powder, heating pad    PRECAUTIONS: None  RED FLAGS: None   WEIGHT BEARING RESTRICTIONS: No  FALLS:  Has patient fallen in last 6 months? No She does reports decreased confidence with taking the bus, stepping off curb.   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   NEXT MD VISIT: Sees [facility] for L  hip in about 2 weeks   OBJECTIVE:  Note: Objective measures were completed at Evaluation unless otherwise noted.  DIAGNOSTIC FINDINGS:  None recent   PATIENT SURVEYS:  Modified Oswestry 19/50   COGNITION: Overall cognitive status: Within functional limits for tasks assessed     SENSATION: WFL  MUSCLE LENGTH: Hamstrings: good Thomas test: not measured but L sided stretch addressed painful area   POSTURE: rounded shoulders, forward head, and increased thoracic kyphosis  PALPATION: TTP on Rt lateral hip Pain with palpation to left posterior hip, left rectus femoris and left iliopsoas  LUMBAR ROM:   AROM eval  Flexion WFL  Extension 75% limited   Right lateral flexion WFL  Left lateral flexion WFL  Right rotation WFL  Left rotation WFL   (Blank rows = not tested)  LOWER EXTREMITY ROM:   WNL   Passive  Right eval Left eval  Hip flexion    Hip extension    Hip abduction    Hip adduction    Hip internal rotation    Hip external rotation    Knee flexion    Knee extension    Ankle dorsiflexion    Ankle plantarflexion    Ankle inversion    Ankle eversion     (Blank rows = not tested)  LOWER EXTREMITY MMT:    MMT Right eval Left eval  Hip flexion 4 4-  Hip extension    Hip abduction    Hip adduction    Hip internal rotation    Hip external rotation    Knee flexion 5 5  Knee extension 5 5  Ankle dorsiflexion    Ankle plantarflexion    Ankle inversion    Ankle eversion     (Blank rows = not tested)  LUMBAR SPECIAL TESTS:  Straight leg raise test: Negative  Hip testing: Negative left scour test, positive FABER test for L anterior hip    FUNCTIONAL TESTS:  5 times sit to stand: 20 seconds no UE   GAIT: Distance walked: 100 Assistive device  utilized: None Level of assistance: Modified independence Comments: Slow pace slight trunk flexion and antalgic patterning  TREATMENT DATE:   [facility] Adult PT Treatment:                                                 DATE: 05/03/24 Therapeutic Activity: Nustep UE and LE for 6 min  2 min walk:  415 feet pain increased in hip and feet  Hamstring/ITB Knee to chest  Ball squeeze x 10 Bridge with ball x 10  Sidelying hip abd Sidelying clam with GTB Sit to stand x 10 GTB around thighs   [facility] Adult PT Treatment:                                                DATE: 04/30/24 Self Care: Relationship of the left anterior hip to the upper lumbar spine, psoas Home exercise program- hip abduction form  Importance of consistent gentle exercise and not overdoing it when you feel good Variety with exercise ( not swimming 10 days in a row), adding walking, or other cardio        Strengthening versus stretching to relieve pain                                                                                                                           PATIENT EDUCATION:  Education details: see above  Person educated: Patient Education method: Explanation, Demonstration, Verbal cues, and Handouts Education comprehension: verbalized understanding and returned demonstration  HOME EXERCISE PROGRAM: Access Code: [email or web address] URL: [email or web address] Date: 04/30/2024 Prepared by: Marci Setter  Exercises - Modified Thomas Stretch  - 1 x daily - 7 x weekly - 1 sets - 3 reps - 1-3 hold - Supine Bridge  - 1 x daily - 7 x weekly - 2 sets - 10 reps - 5 hold - Sidelying Hip Abduction  - 1 x daily - 7 x weekly - 2 sets - 10 reps - 2 hold - Standing Hip Abduction with Counter Support  - 1 x daily - 7 x weekly - 2 sets - 10 reps - 2 hold - Clamshell with Resistance  - 1 x daily - 7 x weekly - 2 sets - 10 reps - Bridge with Hip Abduction and Resistance  - 1 x daily - 7 x weekly - 2 sets - 10 reps  ASSESSMENT:  CLINICAL IMPRESSION: Patient tolerated the session well, walking did increase her hip pain a bit. Used green band to activate outer hips for bridging and sit to stand exercises, which she felt benefit  from. She will have an XR Wednesday which may give her a clearer picture of what is going on.  I  explained she may have both a combination of a soft tissue injury and an arthritic condition.  She will continue to benefit from skilled PT to improve overall ability to stand, walk and tolerate activity.   Patient is a 67 y.o. female who was seen today for physical therapy evaluation and treatment for lower back pain, neck pain.  She is also having a significant amount of pain in her left hip which does seem related to her left iliopsoas.  She presents with signs and symptoms consistent with a tendinopathy in her gluteus medius and may have some anterior hip involvement as well.  While she was traveling walking extensively and climbing stairs she aggravated it and it continues to be the most bothersome symptom that she has.  She will be getting an x-ray in the coming week and more of an orthopedic workup.  OBJECTIVE IMPAIRMENTS: Abnormal gait, decreased activity tolerance, decreased balance, decreased mobility, difficulty walking, decreased strength, increased fascial restrictions, improper body mechanics, obesity, and pain.   ACTIVITY LIMITATIONS: carrying, lifting, bending, sitting, standing, squatting, sleeping, stairs, transfers, bed mobility, locomotion level, and caring for others  PARTICIPATION LIMITATIONS: cleaning, laundry, interpersonal relationship, shopping, community activity, and occupation  PERSONAL FACTORS: Time since onset of injury/illness/exacerbation, Transportation, and 3+ comorbidities: Fibromyalgia, knee surgeries, anxiety are also affecting patient's functional outcome.   REHAB POTENTIAL: Excellent  CLINICAL DECISION MAKING: Evolving/moderate complexity  EVALUATION COMPLEXITY: Moderate   GOALS: Goals reviewed with patient? Yes  SHORT TERM GOALS: Target date: 05/21/2024   Patient will be able to show independence for initial HEP to include posture, core and hip strength and  stability.  Baseline: Goal status: INITIAL  2.  Patient will be able to complete 2 min walk test distance for baseline pain and endurance assessment and goal set  Baseline: 415  Goal status: met   3.  Pt will complete balance assessment and set goal  Baseline:  Goal status: INITIAL   LONG TERM GOALS: Target date: 06/25/2024    Patient will be able to improve 2 min walk test distance by 50 feet or more with LRAD and no increase in pain.  Baseline:  Goal status: INITIAL  2.  Patient will be able to demonstrate hip/knee/ankle strength to 5/5 in order to maximize functional mobility, ambulation and lifting.   Baseline:  Goal status: INITIAL  3.  Patient will be independent with final HEP upon discharge from PT and report consistent benefit following exercise completion.   Baseline:  Goal status: INITIAL  4.  Patient will be able to improve 2 min walk test distance by 50 feet or more with LRAD and no increase in pain.  Baseline: 415 feet  Goal status: INITIAL  5.  Patient will be able to ambulate in the community > 1000 feet with mod I using least restrictive assistive device and min to no discernable limp, pain managed to 4/10.  Baseline:  Goal status: INITIAL  6.  Pt will demo L hip abduction to > 4/5 or more in order to support her with ambulation, walking  Baseline:  Goal status: INITIAL  PLAN:  PT FREQUENCY: 2x/week  PT DURATION: 8 weeks  PLANNED INTERVENTIONS: 97164- PT Re-evaluation, 97750- Physical Performance Testing, 97110-Therapeutic exercises, 97530- Therapeutic activity, [id number]- Neuromuscular re-education, 97535- Self Care, 96045- Manual therapy, [phone]- Gait training, Patient/Family education, Balance training, Stair training, Taping, Dry Needling, Cryotherapy, and Moist heat.  PLAN FOR NEXT SESSION: Check HEP, consider DN for L ant hip vs post hip, core stability, NuStep,  balance assessment (TUG?)   Belissa Kooy, PT 05/03/2024, 10:56 AM   Marci Setter,  PT 05/03/24 10:56 AM Phone: [phone] Fax: [phone]

## 2024-05-05 ENCOUNTER — Ambulatory Visit: Admitting: Physical Therapy

## 2024-05-05 DIAGNOSIS — M1612 Unilateral primary osteoarthritis, left hip: Secondary | ICD-10-CM | POA: Diagnosis not present

## 2024-05-13 ENCOUNTER — Ambulatory Visit: Payer: Self-pay | Admitting: Physical Therapy

## 2024-05-13 ENCOUNTER — Encounter: Payer: Self-pay | Admitting: Physical Therapy

## 2024-05-13 DIAGNOSIS — M545 Low back pain, unspecified: Secondary | ICD-10-CM | POA: Diagnosis not present

## 2024-05-13 DIAGNOSIS — M5459 Other low back pain: Secondary | ICD-10-CM

## 2024-05-13 DIAGNOSIS — M25552 Pain in left hip: Secondary | ICD-10-CM

## 2024-05-13 DIAGNOSIS — M6281 Muscle weakness (generalized): Secondary | ICD-10-CM | POA: Diagnosis not present

## 2024-05-13 DIAGNOSIS — R262 Difficulty in walking, not elsewhere classified: Secondary | ICD-10-CM

## 2024-05-13 DIAGNOSIS — M353 Polymyalgia rheumatica: Secondary | ICD-10-CM

## 2024-05-13 DIAGNOSIS — G8929 Other chronic pain: Secondary | ICD-10-CM | POA: Diagnosis not present

## 2024-05-13 DIAGNOSIS — M4802 Spinal stenosis, cervical region: Secondary | ICD-10-CM | POA: Diagnosis not present

## 2024-05-13 NOTE — Therapy (Signed)
 OUTPATIENT PHYSICAL THERAPY THORACOLUMBAR/HIP RE-EVALUATION   Patient Name: Betty Holmes MRN: [id number] DOB:September 14, 1956, 68 y.o., female Today's Date: 05/13/2024  END OF SESSION:  PT End of Session - 05/13/24 1026     Visit Number 3    Number of Visits 16    Date for PT Re-Evaluation 06/25/24    Authorization Type UHC Dual complete, MCD    PT Start Time 1025    PT Stop Time 1109    PT Time Calculation (min) 44 min    Activity Tolerance Patient tolerated treatment well    Behavior During Therapy WFL for tasks assessed/performed             Past Medical History:  Diagnosis Date   Anxiety    Arthritis    At high risk for tick borne illness    had abx--test her again--she was okey---at [facility]   Depression    Fibromyalgia    Fibromyalgia    Past Surgical History:  Procedure Laterality Date   cataracts removal (bilateral) Bilateral    CESAREAN SECTION     CYST EXCISION Right 05/09/2017   Procedure: EXCISION RIGHT INGUINAL CYST;  Surgeon: Adalberto Acton, MD;  Location: [facility];  Service: General;  Laterality: Right;   JOINT REPLACEMENT     KNEE ARTHROSCOPY     KNEE SURGERY     OVARIAN CYST REMOVAL     Patient Active Problem List   Diagnosis Date Noted   Diverticulosis 04/13/2024   Urinary incontinence, mixed 07/15/2023   Constipation, chronic 10/14/2022   Obesity 10/30/2021   S/P cataract extraction 10/30/2021   Candidal intertrigo 08/22/2021   GAD (generalized anxiety disorder) 09/21/2019   Pain in left wrist 07/24/2017   Painful wrist, right 07/24/2017   Osteopenia 04/21/2017   Cervical stenosis of spine 04/21/2017   Vitamin D  deficiency 04/21/2017   Recurrent major depressive disorder, in partial remission (HCC) 04/09/2017   Chronic bilateral low back pain 04/09/2017   Fibromyalgia     PCP: Kandace Organ NP   REFERRING PROVIDER: Kandace Organ NP Rennis Case PA ([place])   REFERRING DIAG:   Diagnosis  M54.50,G89.29 (ICD-10-CM) - Chronic bilateral low back pain without sciatica  M48.02 (ICD-10-CM) - Cervical stenosis of spine  M16.12 (ICD-10-CM) - Unilateral primary osteoarthritis, left hip  Rationale for Evaluation and Treatment: Rehabilitation  THERAPY DIAG:  Pain in left hip  Polymyalgia (HCC)  Other low back pain  Difficulty in walking, not elsewhere classified  Muscle weakness (generalized)  ONSET DATE: back yrs, L hip March?   SUBJECTIVE:  SUBJECTIVE STATEMENT: Pt saw PA Mc Clung and he offered: injection, steroid pack or ibuprofen .  She chose ibuprofen  but is cautious due to diverticulosis.  Pain today 7/10, she did not take the ibuprofen .  Walking with L toe in.  Feels a bit stronger.  When pain is really bad the exercises are difficulty.   PERTINENT HISTORY:  Chronic joint pain and stiffness, worse with activity, no joint swelling or erythema Unable to tolerate the following med in the past: Cymbalta caused increased anxiety and insomnia, Lyrica  caused withdrawal symptoms when dose was missed, and Gabapentin caused headache  PAIN:  Are you having pain? Yes: NPRS scale: 7 Pain location: low back and L hip (anterior/groin)  Pain description: stiff, sharp in her L hip  Aggravating factors: activity, standing on LLE, sitting too long, walking gets a bit better then it gets worse (30 min)    Relieving factors: Ibu, BC powder, heating pad    PRECAUTIONS: None  RED FLAGS: None   WEIGHT BEARING RESTRICTIONS: No  FALLS:  Has patient fallen in last 6 months? No She does reports decreased confidence with taking the bus, stepping off curb.   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   NEXT MD VISIT: Sees [facility] for L hip in about 2 weeks   OBJECTIVE:  Note: Objective measures were completed at Evaluation unless otherwise noted.  DIAGNOSTIC FINDINGS:  None recent   PATIENT SURVEYS:  Modified Oswestry 19/50     Needs LEFS?   COGNITION: Overall cognitive status: Within functional limits for tasks assessed     SENSATION: WFL  MUSCLE LENGTH: Hamstrings: good Thomas test: not measured but L sided stretch addressed painful area   POSTURE: rounded shoulders, forward head, and increased thoracic kyphosis  PALPATION: TTP on Rt lateral hip Pain with palpation to left posterior hip, left rectus femoris and left iliopsoas  LUMBAR ROM:   AROM eval  Flexion WFL  Extension 75% limited   Right lateral flexion WFL  Left lateral flexion WFL  Right rotation WFL  Left rotation WFL   (Blank rows = not tested)  LOWER EXTREMITY ROM:   WNL - pt with borderline hypermobility in her hip IR  Passive  Right eval Left 05/13/24 eval  Hip flexion  Full , pain ant hip  Hip extension    Hip abduction    Hip adduction    Hip internal rotation  45-50 deg  Hip external rotation  Pain 60 deg   Knee flexion  WNL  Knee extension  WNL   Ankle dorsiflexion    Ankle plantarflexion    Ankle inversion    Ankle eversion     (Blank rows = not tested)  LOWER EXTREMITY MMT:    MMT Right eval Left eval Rt 05/13/24 Lt.  05/13/24  Hip flexion 4 4-    Hip extension   3-/5 3-/5  Hip abduction   3-/5 2+/5  Hip adduction      Hip internal rotation       Hip external rotation      Knee flexion 5 5 5 5   Knee extension 5 5 5 5   Ankle dorsiflexion      Ankle plantarflexion  Ankle inversion      Ankle eversion       (Blank rows = not tested)  LUMBAR SPECIAL TESTS:  Straight leg  raise test: Negative  Hip testing: Negative left scour test, positive FABER test for L anterior hip    FUNCTIONAL TESTS:  5 times sit to stand: 20 seconds no UE   GAIT: Distance walked: 100 Assistive device utilized: None Level of assistance: Modified independence Comments: Slow pace slight trunk flexion and antalgic patterning  TREATMENT DATE:   [facility] Adult PT Treatment:                                                DATE: 05/13/24 Therapeutic Exercise/Activity: NuStep L6 UE and LE  Core bracing with ball squeeze Isometric blue band abd/ER with ab bracing Mini bridge x 10 (band and ball)  SLR x 10 each LE, more difficulty on LLE, easy on Rt , VMO SLR on LLE  Hip abduction LLE , MMT PROM and hip testing  Modalities: Cold pack in sidelying L hip 8 min during self care  Self Care: Use pillows and ice pack vs using Ibuprofen  MMT hips and implication of weakness in hips   [facility] Adult PT Treatment:                                                DATE: 05/03/24 Therapeutic Activity: Nustep UE and LE for 6 min  2 min walk:  415 feet pain increased in hip and feet  Hamstring/ITB Knee to chest  Ball squeeze x 10 Bridge with ball x 10  Sidelying hip abd Sidelying clam with GTB Sit to stand x 10 GTB around thighs   [facility] Adult PT Treatment:                                                DATE: 04/30/24 Self Care: Relationship of the left anterior hip to the upper lumbar spine, psoas Home exercise program- hip abduction form  Importance of consistent gentle exercise and not overdoing it when you feel good Variety with exercise ( not swimming 10 days in a row), adding walking, or other cardio        Strengthening versus stretching to relieve pain                                                                                                                           PATIENT EDUCATION:  Education details: see above  Person educated: Patient Education method: Explanation,  Demonstration, Verbal cues, and Handouts Education comprehension: verbalized  understanding and returned demonstration  HOME EXERCISE PROGRAM: Access Code: [email or web address] URL: [email or web address] Date: 04/30/2024 Prepared by: Marci Setter  Exercises - Modified Thomas Stretch  - 1 x daily - 7 x weekly - 1 sets - 3 reps - 1-3 hold - Supine Bridge  - 1 x daily - 7 x weekly - 2 sets - 10 reps - 5 hold - Sidelying Hip Abduction  - 1 x daily - 7 x weekly - 2 sets - 10 reps - 2 hold - Standing Hip Abduction with Counter Support  - 1 x daily - 7 x weekly - 2 sets - 10 reps - 2 hold - Clamshell with Resistance  - 1 x daily - 7 x weekly - 2 sets - 10 reps - Bridge with Hip Abduction and Resistance  - 1 x daily - 7 x weekly - 2 sets - 10 reps  ASSESSMENT:  CLINICAL IMPRESSION: Patient did have confirmed XR with osteoarthritis, "not too bad" per PA.  She continues to have signifnst pain in her L hip, from her L5 and wrapping to post glutes and thigh and hip.  Pain interferes with stepping up with her LLE and exercises are quite painful.  I offered her alternatives to OTC meds and tips for positioning her hips.  Focus will be on this area and patient will be seen here with the Hip referral by PA McClung.     Patient is a 68 y.o. female who was seen today for physical therapy evaluation and treatment for lower back pain, neck pain.  She is also having a significant amount of pain in her left hip which does seem related to her left iliopsoas.  She presents with signs and symptoms consistent with a tendinopathy in her gluteus medius and may have some anterior hip involvement as well.  While she was traveling walking extensively and climbing stairs she aggravated it and it continues to be the most bothersome symptom that she has.  She will be getting an x-ray in the coming week and more of an orthopedic workup.  OBJECTIVE IMPAIRMENTS: Abnormal gait, decreased activity tolerance, decreased balance,  decreased mobility, difficulty walking, decreased strength, increased fascial restrictions, improper body mechanics, obesity, and pain.   ACTIVITY LIMITATIONS: carrying, lifting, bending, sitting, standing, squatting, sleeping, stairs, transfers, bed mobility, locomotion level, and caring for others  PARTICIPATION LIMITATIONS: cleaning, laundry, interpersonal relationship, shopping, community activity, and occupation  PERSONAL FACTORS: Time since onset of injury/illness/exacerbation, Transportation, and 3+ comorbidities: Fibromyalgia, knee surgeries, anxiety are also affecting patient's functional outcome.   REHAB POTENTIAL: Excellent  CLINICAL DECISION MAKING: Evolving/moderate complexity  EVALUATION COMPLEXITY: Moderate   GOALS: Goals reviewed with patient? Yes  SHORT TERM GOALS: Target date: 05/21/2024   Patient will be able to show independence for initial HEP to include posture, core and hip strength and stability.  Baseline: Goal status: MET   2.  Patient will be able to complete 2 min walk test distance for baseline pain and endurance assessment and goal set  Baseline: 415  Goal status: MET   3.  Pt will complete balance assessment and set goal  Baseline:  Goal status: INITIAL   LONG TERM GOALS: Target date: 06/25/2024    Patient will be able to improve 2 min walk test distance by 50 feet or more with LRAD and no increase in pain.  Baseline:  Goal status: INITIAL  2.  Patient will be able to demonstrate hip/knee/ankle strength to 5/5 in order to maximize functional mobility, ambulation  and lifting.   Baseline:  Goal status: INITIAL  3.  Patient will be independent with final HEP upon discharge from PT and report consistent benefit following exercise completion.   Baseline:  Goal status: INITIAL  4.  Patient will be able to improve 2 min walk test distance by 50 feet or more with LRAD and no increase in pain.  Baseline: 415 feet  Goal status: INITIAL  5.   Patient will be able to ambulate in the community > 1000 feet with mod I using least restrictive assistive device and min to no discernable limp, pain managed to 4/10.  Baseline:  Goal status: INITIAL  6.  Pt will demo L hip abduction to > 4/5 or more in order to support her with ambulation, walking  Baseline:  Goal status: INITIAL  PLAN:  PT FREQUENCY: 2x/week  PT DURATION: 8 weeks  PLANNED INTERVENTIONS: 97164- PT Re-evaluation, 97750- Physical Performance Testing, 97110-Therapeutic exercises, 97530- Therapeutic activity, [id number]- Neuromuscular re-education, 97535- Self Care, 40981- Manual therapy, [phone]- Gait training, Patient/Family education, Balance training, Stair training, Taping, Dry Needling, Cryotherapy, and Moist heat.  PLAN FOR NEXT SESSION: sit to stand with band (in HEP now), balance assessment.  Hip abd, glute strength as tol.  Ice post. Check HEP, consider DN for L ant hip vs post hip, core stability, NuStep, (TUG?)   Lacresia Darwish, PT 05/13/2024, 11:14 AM   Marci Setter, PT 05/13/24 11:14 AM Phone: [phone] Fax: [phone]

## 2024-05-18 ENCOUNTER — Encounter: Payer: Self-pay | Admitting: Nurse Practitioner

## 2024-05-18 ENCOUNTER — Ambulatory Visit (INDEPENDENT_AMBULATORY_CARE_PROVIDER_SITE_OTHER): Admitting: Nurse Practitioner

## 2024-05-18 VITALS — BP 118/66 | HR 66 | Temp 97.3°F | Ht 63.0 in | Wt 193.0 lb

## 2024-05-18 DIAGNOSIS — M25511 Pain in right shoulder: Secondary | ICD-10-CM | POA: Diagnosis not present

## 2024-05-18 DIAGNOSIS — M797 Fibromyalgia: Secondary | ICD-10-CM | POA: Diagnosis not present

## 2024-05-18 MED ORDER — MELOXICAM 7.5 MG PO TABS: 7.5000 mg | ORAL_TABLET | Freq: Every day | ORAL | 5 refills | Status: AC

## 2024-05-18 NOTE — Assessment & Plan Note (Signed)
 Polymyalgia was noted in her chart by PT, so she thought this was same as polymyalgia rheumatica. I explained the difference between these diagnosis and provided reassurance that she does not have PMR. She verbalized understanding. She states she completed a 10days course of  ibuprofen  600mg  TID per directions of PT. She did not take mobic  while taking ibuprofen .  She is not interested in taking an SNRI or gabapentin or lyrica  or TCA  Advised to resume mobic  7.5mg  every day and tylenol  650mg  TID.

## 2024-05-18 NOTE — Progress Notes (Signed)
 Established Patient Visit  Patient: Betty Holmes   DOB: [birth year]   68 y.o. Female  MRN: [id number] Visit Date: 05/18/2024  Subjective:     Chief Complaint  Patient presents with   Follow-up    Follow up- wants to discuss polymyalgia, and discuss the meloxicam   Referral to PT for right shoulder   Shoulder Pain  The pain is present in the right shoulder. This is a new problem. The current episode started 1 to 4 weeks ago. There has been no history of extremity trauma. The problem occurs intermittently. The problem has been waxing and waning. The quality of the pain is described as aching. The pain is mild. Associated symptoms include stiffness. Pertinent negatives include no fever, inability to bear weight, itching, joint locking, joint swelling, limited range of motion, numbness or tingling. The symptoms are aggravated by activity and lying down. She has tried acetaminophen  and NSAIDS for the symptoms. The treatment provided mild relief. Family history does not include gout or rheumatoid arthritis. Her past medical history is significant for osteoarthritis. There is no history of diabetes, gout or rheumatoid arthritis.   Fibromyalgia Polymyalgia was noted in her chart by PT, so she thought this was same as polymyalgia rheumatica. I explained the difference between these diagnosis and provided reassurance that she does not have PMR. She verbalized understanding. She states she completed a 10days course of  ibuprofen  600mg  TID per directions of PT. She did not take mobic  while taking ibuprofen .  She is not interested in taking an SNRI or gabapentin or lyrica  or TCA  Advised to resume mobic  7.5mg  every day and tylenol  650mg  TID.  Reviewed medical, surgical, and social history today  Medications: Outpatient Medications Prior to Visit  Medication Sig   acetaminophen  (TYLENOL ) 500 MG tablet Take 500 mg by mouth every 6 (six) hours as needed for mild pain (pain score 1-3)  or moderate pain (pain score 4-6).   estradiol  (ESTRACE ) 0.1 MG/GM vaginal cream Place 1 Applicatorful vaginally once a week.   hydrOXYzine  (ATARAX ) 25 MG tablet TAKE 1 TABLET (25 MG TOTAL) BY MOUTH DAILY AS NEEDED FOR ANXIETY OR ITCHING.   [DISCONTINUED] ibuprofen  (ADVIL ) 600 MG tablet Take 600 mg by mouth every 8 (eight) hours as needed for mild pain (pain score 1-3), moderate pain (pain score 4-6) or headache.   Clobetasol  Prop Emollient Base (CLOBETASOL  PROPIONATE E) 0.05 % emollient cream Apply 1 Application topically at bedtime. (Patient not taking: Reported on 05/18/2024)   [DISCONTINUED] meloxicam  (MOBIC ) 7.5 MG tablet Take 1 tablet (7.5 mg total) by mouth daily. With food (Patient not taking: Reported on 04/30/2024)   No facility-administered medications prior to visit.   Reviewed past medical and social history.   ROS per HPI above      Objective:  BP 118/66 (BP Location: Left Arm, Patient Position: Sitting, Cuff Size: Large)   Pulse 66   Temp (!) 97.3 F (36.3 C) (Temporal)   Ht 5\' 3"  (1.6 m)   Wt 193 lb (87.5 kg)   SpO2 100%   BMI 34.19 kg/m      Physical Exam Vitals and nursing note reviewed.  Constitutional:      General: She is not in acute distress.    Appearance: She is obese.  Musculoskeletal:     Right shoulder: Tenderness present. No swelling, effusion, bony tenderness or crepitus. Normal range of motion. Normal strength. Normal pulse.  Right upper arm: Normal.     Right elbow: Normal.     Cervical back: Tenderness present. No rigidity, torticollis, bony tenderness or crepitus. Pain with movement present. Normal range of motion.  Neurological:     Mental Status: She is alert.     No results found for any visits on 05/18/24.    Assessment & Plan:    Problem List Items Addressed This Visit     Fibromyalgia   Polymyalgia was noted in her chart by PT, so she thought this was same as polymyalgia rheumatica. I explained the difference between these  diagnosis and provided reassurance that she does not have PMR. She verbalized understanding. She states she completed a 10days course of  ibuprofen  600mg  TID per directions of PT. She did not take mobic  while taking ibuprofen .  She is not interested in taking an SNRI or gabapentin or lyrica  or TCA  Advised to resume mobic  7.5mg  every day and tylenol  650mg  TID.       Relevant Medications   acetaminophen  (TYLENOL ) 500 MG tablet   meloxicam  (MOBIC ) 7.5 MG tablet   Other Relevant Orders   Ambulatory referral to Physical Therapy   Other Visit Diagnoses       Acute pain of right shoulder    -  Primary   Relevant Orders   Ambulatory referral to Physical Therapy      Return if symptoms worsen or fail to improve.     Kathrene Parents, NP

## 2024-05-18 NOTE — Patient Instructions (Addendum)
 Resume mobic  and tylenol   Myofascial Pain Syndrome and Fibromyalgia Myofascial pain syndrome and fibromyalgia are both pain disorders. You may feel this pain mainly in your muscles. Myofascial pain syndrome: Always has tender points in the muscles that will cause pain when pressed (trigger points). The pain may come and go. Usually affects your neck, upper back, and shoulder areas. The pain often moves into your arms and hands. Fibromyalgia: Has muscle pains and tenderness that come and go. Is often associated with tiredness (fatigue) and sleep problems. Has trigger points. Tends to be long-lasting (chronic), but is not life-threatening. Fibromyalgia and myofascial pain syndrome are not the same. However, they often occur together. If you have both conditions, each can make the other worse. Both are common and can cause enough pain and fatigue to make day-to-day activities difficult. Both can be hard to diagnose because their symptoms are common in many other conditions. What are the causes? The exact causes of these conditions are not known. What increases the risk? You are more likely to develop either of these conditions if: You have a family history of the condition. You are female. You have certain triggers, such as: Spine disorders. An injury (trauma) or other physical stressors. Being under a lot of stress. Medical conditions such as osteoarthritis, rheumatoid arthritis, or lupus. What are the signs or symptoms? Fibromyalgia The main symptom of fibromyalgia is widespread pain and tenderness in your muscles. Pain is sometimes described as stabbing, shooting, or burning. You may also have: Tingling or numbness. Sleep problems and fatigue. Problems with attention and concentration (fibro fog). Other symptoms may include: Bowel and bladder problems. Headaches. Vision problems. Sensitivity to odors and noises. Depression or mood changes. Painful menstrual periods  (dysmenorrhea). Dry skin or eyes. These symptoms can vary over time. Myofascial pain syndrome Symptoms of myofascial pain syndrome include: Tight, ropy bands of muscle. Uncomfortable sensations in muscle areas. These may include aching, cramping, burning, numbness, tingling, and weakness. Difficulty moving certain parts of the body freely (poor range of motion). How is this diagnosed? This condition may be diagnosed by your symptoms and medical history. You will also have a physical exam. In general: Fibromyalgia is diagnosed if you have pain, fatigue, and other symptoms for more than 3 months, and symptoms cannot be explained by another condition. Myofascial pain syndrome is diagnosed if you have trigger points in your muscles, and those trigger points are tender and cause pain elsewhere in your body (referred pain). How is this treated? Treatment for these conditions depends on the type that you have. For fibromyalgia, a healthy lifestyle is the most important treatment including aerobic and strength exercises. Different types of medicines are used to help treat pain and include: NSAIDs. Medicines for treating depression. Medicines that help control seizures. Medicines that relax the muscles. Treatment for myofascial pain syndrome includes: Pain medicines, such as NSAIDs. Cooling and stretching of muscles. Massage therapy with myofascial release technique. Trigger point injections. Treating these conditions often requires a team of health care providers. These may include: Your primary care provider. A physical therapist. Complementary health care providers, such as massage therapists or acupuncturists. A psychiatrist for cognitive behavioral therapy. Follow these instructions at home: Medicines Take over-the-counter and prescription medicines only as told by your health care provider. Ask your health care provider if the medicine prescribed to you: Requires you to avoid driving or  using machinery. Can cause constipation. You may need to take these actions to prevent or treat constipation: Drink enough fluid  to keep your urine pale yellow. Take over-the-counter or prescription medicines. Eat foods that are high in fiber, such as beans, whole grains, and fresh fruits and vegetables. Limit foods that are high in fat and processed sugars, such as fried or sweet foods. Lifestyle  Do exercises as told by your health care provider or physical therapist. Practice relaxation techniques to control your stress. You may want to try: Biofeedback. Visual imagery. Hypnosis. Muscle relaxation. Yoga. Meditation. Maintain a healthy lifestyle. This includes eating a healthy diet and getting enough sleep. Do not use any products that contain nicotine or tobacco. These products include cigarettes, chewing tobacco, and vaping devices, such as e-cigarettes. If you need help quitting, ask your health care provider. General instructions Talk to your health care provider about complementary treatments, such as acupuncture or massage. Do not do activities that stress or strain your muscles. This includes repetitive motions and heavy lifting. Keep all follow-up visits. This is important. Where to find support Consider joining a support group with others who are diagnosed with this condition. National Fibromyalgia Association: fmaware.org Where to find more information U.S. Pain Foundation: uspainfoundation.org Contact a health care provider if: You have new symptoms. Your symptoms get worse or your pain is severe. You have side effects from your medicines. You have trouble sleeping. Your condition is causing depression or anxiety. Get help right away if: You have thoughts of hurting yourself or others. Get help right away if you feel like you may hurt yourself or others, or have thoughts about taking your own life. Go to your nearest emergency room or: Call 911. Call the National  Suicide Prevention Lifeline at 970-326-3583 or 988. This is open 24 hours a day. Text the Crisis Text Line at 450 742 4922. This information is not intended to replace advice given to you by your health care provider. Make sure you discuss any questions you have with your health care provider. Document Revised: 09/16/2022 Document Reviewed: 11/09/2021 Elsevier Patient Education  2024 [employer].

## 2024-05-19 ENCOUNTER — Encounter: Payer: Self-pay | Admitting: Physical Therapy

## 2024-05-19 ENCOUNTER — Ambulatory Visit: Admitting: Physical Therapy

## 2024-05-19 DIAGNOSIS — M4802 Spinal stenosis, cervical region: Secondary | ICD-10-CM | POA: Diagnosis not present

## 2024-05-19 DIAGNOSIS — M353 Polymyalgia rheumatica: Secondary | ICD-10-CM | POA: Diagnosis not present

## 2024-05-19 DIAGNOSIS — G8929 Other chronic pain: Secondary | ICD-10-CM | POA: Diagnosis not present

## 2024-05-19 DIAGNOSIS — M545 Low back pain, unspecified: Secondary | ICD-10-CM | POA: Diagnosis not present

## 2024-05-19 DIAGNOSIS — M5459 Other low back pain: Secondary | ICD-10-CM | POA: Diagnosis not present

## 2024-05-19 DIAGNOSIS — R262 Difficulty in walking, not elsewhere classified: Secondary | ICD-10-CM | POA: Diagnosis not present

## 2024-05-19 DIAGNOSIS — M25552 Pain in left hip: Secondary | ICD-10-CM

## 2024-05-19 DIAGNOSIS — M6281 Muscle weakness (generalized): Secondary | ICD-10-CM | POA: Diagnosis not present

## 2024-05-19 NOTE — Therapy (Signed)
 OUTPATIENT PHYSICAL THERAPY THORACOLUMBAR   Patient Name: Betty Holmes MRN: [id number] DOB:[birth year], 68 y.o., female Today's Date: 05/19/2024  END OF SESSION:  PT End of Session - 05/19/24 0853     Visit Number 4    Number of Visits 16    Date for PT Re-Evaluation 06/25/24    Authorization Type [employer] Dual complete, MCD    PT Start Time 0851    PT Stop Time 0930    PT Time Calculation (min) 39 min             Past Medical History:  Diagnosis Date   Anxiety    Arthritis    At high risk for tick borne illness    had abx--test her again--she was okey---[facility]   Depression    Fibromyalgia    Fibromyalgia    Past Surgical History:  Procedure Laterality Date   cataracts removal (bilateral) Bilateral    CESAREAN SECTION     CYST EXCISION Right 05/09/2017   Procedure: EXCISION RIGHT INGUINAL CYST;  Surgeon: Adalberto Acton, MD;  Location: [facility];  Service: General;  Laterality: Right;   JOINT REPLACEMENT     KNEE ARTHROSCOPY     KNEE SURGERY     OVARIAN CYST REMOVAL     Patient Active Problem List   Diagnosis Date Noted   Diverticulosis 04/13/2024   Urinary incontinence, mixed 07/15/2023   Constipation, chronic 10/14/2022   Obesity 10/30/2021   S/P cataract extraction 10/30/2021   Candidal intertrigo 08/22/2021   GAD (generalized anxiety disorder) 09/21/2019   Pain in left wrist 07/24/2017   Painful wrist, right 07/24/2017   Osteopenia 04/21/2017   Cervical stenosis of spine 04/21/2017   Vitamin D  deficiency 04/21/2017   Recurrent major depressive disorder, in partial remission (HCC) 04/09/2017   Chronic bilateral low back pain 04/09/2017   Fibromyalgia     PCP: Kandace Organ NP   REFERRING PROVIDER: Kandace Organ NP Rennis Case PA ([place])   REFERRING DIAG:  Diagnosis  M54.50,G89.29 (ICD-10-CM) - Chronic bilateral low back pain without sciatica  M48.02 (ICD-10-CM) - Cervical stenosis of spine   M16.12 (ICD-10-CM) - Unilateral primary osteoarthritis, left hip  Rationale for Evaluation and Treatment: Rehabilitation  THERAPY DIAG:  Pain in left hip  Other low back pain  ONSET DATE: back yrs, L hip March?   SUBJECTIVE:                                                                                                                                                                                           SUBJECTIVE STATEMENT: Low back  left hip pain wrapping around left thigh. 6/10. Saw MD for shoulder who will send referral. Talked to my doctor regarding the visit diagnosis: Polymyalgia. The exercises are killer but I do them anyway.   Pt saw PA Mc Clung and he offered: injection, steroid pack or ibuprofen .  She chose ibuprofen  but is cautious due to diverticulosis.  Pain today 7/10, she did not take the ibuprofen .  Walking with L toe in.  Feels a bit stronger.  When pain is really bad the exercises are difficulty.   PERTINENT HISTORY:  Chronic joint pain and stiffness, worse with activity, no joint swelling or erythema Unable to tolerate the following med in the past: Cymbalta caused increased anxiety and insomnia, Lyrica  caused withdrawal symptoms when dose was missed, and Gabapentin caused headache  PAIN:  Are you having pain? Yes: NPRS scale: 7 Pain location: low back and L hip (anterior/groin)  Pain description: stiff, sharp in her L hip  Aggravating factors: activity, standing on LLE, sitting too long, walking gets a bit better then it gets worse (30 min)    Relieving factors: Ibu, BC powder, heating pad    PRECAUTIONS: None  RED FLAGS: None   WEIGHT BEARING RESTRICTIONS: No  FALLS:  Has patient fallen in last 6 months? No She does reports decreased confidence with taking the bus, stepping off curb.   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   NEXT MD VISIT: Sees [facility] for L hip in about 2 weeks   OBJECTIVE:  Note: Objective measures were completed at Evaluation unless otherwise noted.  DIAGNOSTIC FINDINGS:  None recent   PATIENT SURVEYS:  Modified Oswestry 19/50     Needs LEFS?   COGNITION: Overall cognitive status: Within functional limits for tasks assessed     SENSATION: WFL  MUSCLE LENGTH: Hamstrings: good Thomas test: not measured but L sided stretch addressed painful area   POSTURE: rounded shoulders, forward head, and increased thoracic kyphosis  PALPATION: TTP on Rt lateral hip Pain with palpation to left posterior hip, left rectus femoris and left iliopsoas  LUMBAR ROM:   AROM eval  Flexion WFL  Extension 75% limited   Right lateral flexion WFL  Left lateral flexion WFL  Right rotation WFL  Left rotation WFL   (Blank rows = not tested)  LOWER EXTREMITY ROM:   WNL - pt with borderline hypermobility in her hip IR  Passive  Right eval Left 05/13/24 eval  Hip flexion  Full , pain ant hip  Hip extension    Hip abduction    Hip adduction    Hip internal rotation  45-50 deg  Hip external rotation  Pain 60 deg   Knee flexion  WNL  Knee extension  WNL   Ankle dorsiflexion    Ankle plantarflexion    Ankle inversion    Ankle eversion     (Blank rows = not tested)  LOWER EXTREMITY MMT:    MMT Right eval Left eval Rt 05/13/24 Lt.  05/13/24  Hip flexion 4 4-    Hip extension   3-/5 3-/5  Hip abduction   3-/5 2+/5  Hip adduction      Hip internal rotation  Hip external rotation      Knee flexion 5 5 5 5   Knee extension 5 5 5 5   Ankle dorsiflexion      Ankle plantarflexion      Ankle inversion      Ankle eversion       (Blank rows = not tested)  LUMBAR SPECIAL TESTS:  Straight leg raise test:  Negative  Hip testing: Negative left scour test, positive FABER test for L anterior hip    FUNCTIONAL TESTS:  5 times sit to stand: 20 seconds no UE   GAIT: Distance walked: 100 Assistive device utilized: None Level of assistance: Modified independence Comments: Slow pace slight trunk flexion and antalgic patterning  TREATMENT DATE:  [facility] Adult PT Treatment:                                                DATE: 05/19/24 Therapeutic Exercise: Nustep L6 UE/LE x 5 minutes Bridge 5 sec 10 x 2  Iso clam using belt 5 sec x 10 Iso supine bilat abdct with belt at thighs -legs over 2 pillows for slight knee flexion Iso ball squeeze 5 sec x 10  Updated HEP     [facility] Adult PT Treatment:                                                DATE: 05/13/24 Therapeutic Exercise/Activity: NuStep L6 UE and LE  Core bracing with ball squeeze Isometric blue band abd/ER with ab bracing Mini bridge x 10 (band and ball)  SLR x 10 each LE, more difficulty on LLE, easy on Rt , VMO SLR on LLE  Hip abduction LLE , MMT PROM and hip testing  Modalities: Cold pack in sidelying L hip 8 min during self care  Self Care: Use pillows and ice pack vs using Ibuprofen  MMT hips and implication of weakness in hips   [facility] Adult PT Treatment:                                                DATE: 05/03/24 Therapeutic Activity: Nustep UE and LE for 6 min  2 min walk:  415 feet pain increased in hip and feet  Hamstring/ITB Knee to chest  Ball squeeze x 10 Bridge with ball x 10  Sidelying hip abd Sidelying clam with GTB Sit to stand x 10 GTB around thighs   [facility] Adult PT Treatment:                                                DATE: 04/30/24 Self Care: Relationship of the left anterior hip to the upper lumbar spine, psoas Home exercise program- hip abduction form  Importance of consistent gentle exercise and not overdoing it when you feel good Variety with exercise ( not swimming 10 days in a row), adding walking,  or other cardio        Strengthening versus stretching to relieve pain  PATIENT EDUCATION:  Education details: see above  Person educated: Patient Education method: Explanation, Demonstration, Verbal cues, and Handouts Education comprehension: verbalized understanding and returned demonstration  HOME EXERCISE PROGRAM: Access Code: [email or web address] URL: [email or web address] Date: 04/30/2024 Prepared by: Marci Setter  Exercises - Modified Thomas Stretch  - 1 x daily - 7 x weekly - 1 sets - 3 reps - 1-3 hold - Supine Bridge  - 1 x daily - 7 x weekly - 2 sets - 10 reps - 5 hold - Sidelying Hip Abduction  - 1 x daily - 7 x weekly - 2 sets - 10 reps - 2 hold - Standing Hip Abduction with Counter Support  - 1 x daily - 7 x weekly - 2 sets - 10 reps - 2 hold - Clamshell with Resistance  - 1 x daily - 7 x weekly - 2 sets - 10 reps - Bridge with Hip Abduction and Resistance  - 1 x daily - 7 x weekly - 2 sets - 10 reps 05/19/24 - Bridge with Hip Abduction and Resistance (BELT)  - 1 x daily - 7 x weekly - 2 sets - 10 reps - Hooklying Bilateral Isometric Clamshell (BELT)  - 1 x daily - 7 x weekly - 2 sets - 10 reps - Supine Hip Abduction isometric into belt - pillows under knees  - 1 x daily - 7 x weekly - 2 sets - 10 reps - Supine Hip Adduction Isometric with Ball  - 1 x daily - 7 x weekly - 2 sets - 10 reps - 5 hold  ASSESSMENT:  CLINICAL IMPRESSION: Pt arrives with questions regarding her visit diagnosis, will defer to primary PT to clarify Polymyalgia. As far as her HEP she reports that most of her exercises are very painful. Today, time spent with modifying her HEP to isometrics which was tolerated well, without increased pain. Updated HEP and she will focus on only exercises issued today. Will assess response next session. Needs balance assessment.    05/13/24:  Patient did have confirmed XR with osteoarthritis, "not too bad" per PA.  She continues to have signifnst pain in her L hip, from her L5 and wrapping to post glutes and thigh and hip.  Pain interferes with stepping up with her LLE and exercises are quite painful.  I offered her alternatives to OTC meds and tips for positioning her hips.  Focus will be on this area and patient will be seen here with the Hip referral by PA McClung.     Patient is a 68 y.o. female who was seen today for physical therapy evaluation and treatment for lower back pain, neck pain.  She is also having a significant amount of pain in her left hip which does seem related to her left iliopsoas.  She presents with signs and symptoms consistent with a tendinopathy in her gluteus medius and may have some anterior hip involvement as well.  While she was traveling walking extensively and climbing stairs she aggravated it and it continues to be the most bothersome symptom that she has.  She will be getting an x-ray in the coming week and more of an orthopedic workup.  OBJECTIVE IMPAIRMENTS: Abnormal gait, decreased activity tolerance, decreased balance, decreased mobility, difficulty walking, decreased strength, increased fascial restrictions, improper body mechanics, obesity, and pain.   ACTIVITY LIMITATIONS: carrying, lifting, bending, sitting, standing, squatting, sleeping, stairs, transfers, bed mobility, locomotion level, and caring for others  PARTICIPATION LIMITATIONS: cleaning, laundry, interpersonal relationship, shopping, community activity, and  occupation  PERSONAL FACTORS: Time since onset of injury/illness/exacerbation, Transportation, and 3+ comorbidities: Fibromyalgia, knee surgeries, anxiety are also affecting patient's functional outcome.   REHAB POTENTIAL: Excellent  CLINICAL DECISION MAKING: Evolving/moderate complexity  EVALUATION COMPLEXITY: Moderate   GOALS: Goals reviewed with patient? Yes  SHORT TERM  GOALS: Target date: 05/21/2024   Patient will be able to show independence for initial HEP to include posture, core and hip strength and stability.  Baseline: Goal status: MET   2.  Patient will be able to complete 2 min walk test distance for baseline pain and endurance assessment and goal set  Baseline: 415  Goal status: MET   3.  Pt will complete balance assessment and set goal  Baseline:  Goal status: INITIAL   LONG TERM GOALS: Target date: 06/25/2024    Patient will be able to improve 2 min walk test distance by 50 feet or more with LRAD and no increase in pain.  Baseline:  Goal status: INITIAL  2.  Patient will be able to demonstrate hip/knee/ankle strength to 5/5 in order to maximize functional mobility, ambulation and lifting.   Baseline:  Goal status: INITIAL  3.  Patient will be independent with final HEP upon discharge from PT and report consistent benefit following exercise completion.   Baseline:  Goal status: INITIAL  4.  Patient will be able to improve 2 min walk test distance by 50 feet or more with LRAD and no increase in pain.  Baseline: 415 feet  Goal status: INITIAL  5.  Patient will be able to ambulate in the community > 1000 feet with mod I using least restrictive assistive device and min to no discernable limp, pain managed to 4/10.  Baseline:  Goal status: INITIAL  6.  Pt will demo L hip abduction to > 4/5 or more in order to support her with ambulation, walking  Baseline:  Goal status: INITIAL  PLAN:  PT FREQUENCY: 2x/week  PT DURATION: 8 weeks  PLANNED INTERVENTIONS: 97164- PT Re-evaluation, 97750- Physical Performance Testing, 97110-Therapeutic exercises, 97530- Therapeutic activity, [id number]- Neuromuscular re-education, 97535- Self Care, [id number]- Manual therapy, [phone]- Gait training, Patient/Family education, Balance training, Stair training, Taping, Dry Needling, Cryotherapy, and Moist heat.  PLAN FOR NEXT SESSION: sit to stand with band (in HEP  now), balance assessment.  Hip abd, glute strength as tol.  Ice post. Check HEP, consider DN for L ant hip vs post hip, core stability, NuStep, (TUG?)   Gasper Karst, PTA 05/19/24 12:05 PM Phone: [phone] Fax: [phone]

## 2024-05-21 ENCOUNTER — Encounter: Payer: Self-pay | Admitting: Physical Therapy

## 2024-05-21 ENCOUNTER — Ambulatory Visit: Admitting: Physical Therapy

## 2024-05-21 DIAGNOSIS — M6281 Muscle weakness (generalized): Secondary | ICD-10-CM | POA: Diagnosis not present

## 2024-05-21 DIAGNOSIS — G8929 Other chronic pain: Secondary | ICD-10-CM | POA: Diagnosis not present

## 2024-05-21 DIAGNOSIS — M25552 Pain in left hip: Secondary | ICD-10-CM

## 2024-05-21 DIAGNOSIS — M5459 Other low back pain: Secondary | ICD-10-CM

## 2024-05-21 DIAGNOSIS — R262 Difficulty in walking, not elsewhere classified: Secondary | ICD-10-CM | POA: Diagnosis not present

## 2024-05-21 DIAGNOSIS — M4802 Spinal stenosis, cervical region: Secondary | ICD-10-CM | POA: Diagnosis not present

## 2024-05-21 DIAGNOSIS — M353 Polymyalgia rheumatica: Secondary | ICD-10-CM | POA: Diagnosis not present

## 2024-05-21 DIAGNOSIS — M545 Low back pain, unspecified: Secondary | ICD-10-CM | POA: Diagnosis not present

## 2024-05-21 NOTE — Therapy (Signed)
 OUTPATIENT PHYSICAL THERAPY THORACOLUMBAR   Patient Name: Betty Holmes MRN: [id number] DOB:[birth year], 68 y.o., female Today's Date: 05/21/2024  END OF SESSION:  PT End of Session - 05/21/24 0851     Visit Number 5    Number of Visits 16    Date for PT Re-Evaluation 06/25/24    Authorization Type [employer] Dual complete, MCD    PT Start Time 0850    PT Stop Time 0930    PT Time Calculation (min) 40 min             Past Medical History:  Diagnosis Date   Anxiety    Arthritis    At high risk for tick borne illness    had abx--test her again--she was okey---at [facility]   Depression    Fibromyalgia    Fibromyalgia    Past Surgical History:  Procedure Laterality Date   cataracts removal (bilateral) Bilateral    CESAREAN SECTION     CYST EXCISION Right 05/09/2017   Procedure: EXCISION RIGHT INGUINAL CYST;  Surgeon: Adalberto Acton, MD;  Location:  [facility];  Service: General;  Laterality: Right;   JOINT REPLACEMENT     KNEE ARTHROSCOPY     KNEE SURGERY     OVARIAN CYST REMOVAL     Patient Active Problem List   Diagnosis Date Noted   Diverticulosis 04/13/2024   Urinary incontinence, mixed 07/15/2023   Constipation, chronic 10/14/2022   Obesity 10/30/2021   S/P cataract extraction 10/30/2021   Candidal intertrigo 08/22/2021   GAD (generalized anxiety disorder) 09/21/2019   Pain in left wrist 07/24/2017   Painful wrist, right 07/24/2017   Osteopenia 04/21/2017   Cervical stenosis of spine 04/21/2017   Vitamin D  deficiency 04/21/2017   Recurrent major depressive disorder, in partial remission (HCC) 04/09/2017   Chronic bilateral low back pain 04/09/2017   Fibromyalgia     PCP: Kandace Organ NP   REFERRING PROVIDER: Kandace Organ NP Rennis Case PA ([place])   REFERRING DIAG:  Diagnosis  M54.50,G89.29 (ICD-10-CM) - Chronic bilateral low back pain without sciatica  M48.02 (ICD-10-CM) - Cervical stenosis of spine   M16.12 (ICD-10-CM) - Unilateral primary osteoarthritis, left hip  Rationale for Evaluation and Treatment: Rehabilitation  THERAPY DIAG:  Pain in left hip  Other low back pain  ONSET DATE: back yrs, L hip March?   SUBJECTIVE:                                                                                                                                                                                           SUBJECTIVE STATEMENT: 05/21/24: 3/10  pain left hip wrapping around left thigh.    Low back left hip pain wrapping around left thigh. 6/10. Saw MD for shoulder who will send referral. Talked to my doctor regarding the visit diagnosis: Polymyalgia. The exercises are killer but I do them anyway.   Pt saw PA Mc Clung and he offered: injection, steroid pack or ibuprofen .  She chose ibuprofen  but is cautious due to diverticulosis.  Pain today 7/10, she did not take the ibuprofen .  Walking with L toe in.  Feels a bit stronger.  When pain is really bad the exercises are difficulty.   PERTINENT HISTORY:  Chronic joint pain and stiffness, worse with activity, no joint swelling or erythema Unable to tolerate the following med in the past: Cymbalta caused increased anxiety and insomnia, Lyrica  caused withdrawal symptoms when dose was missed, and Gabapentin caused headache  PAIN:  Are you having pain? Yes: NPRS scale: 7 Pain location: low back and L hip (anterior/groin)  Pain description: stiff, sharp in her L hip  Aggravating factors: activity, standing on LLE, sitting too long, walking gets a bit better then it gets worse (30 min)    Relieving factors: Ibu, BC powder, heating pad    PRECAUTIONS: None  RED FLAGS: None   WEIGHT BEARING RESTRICTIONS: No  FALLS:  Has patient fallen in last 6 months? No She does reports decreased confidence with taking the bus, stepping off curb.   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   NEXT MD VISIT: Sees [facility] for L hip in about 2 weeks   OBJECTIVE:  Note: Objective measures were completed at Evaluation unless otherwise noted.  DIAGNOSTIC FINDINGS:  None recent   PATIENT SURVEYS:  Modified Oswestry 19/50     Needs LEFS?   COGNITION: Overall cognitive status: Within functional limits for tasks assessed     SENSATION: WFL  MUSCLE LENGTH: Hamstrings: good Thomas test: not measured but L sided stretch addressed painful area   POSTURE: rounded shoulders, forward head, and increased thoracic kyphosis  PALPATION: TTP on Rt lateral hip Pain with palpation to left posterior hip, left rectus femoris and left iliopsoas  LUMBAR ROM:   AROM eval  Flexion WFL  Extension 75% limited   Right lateral flexion WFL  Left lateral flexion WFL  Right rotation WFL  Left rotation WFL   (Blank rows = not tested)  LOWER EXTREMITY ROM:   WNL - pt with borderline hypermobility in her hip IR  Passive  Right eval Left 05/13/24 eval  Hip flexion  Full , pain ant hip  Hip extension    Hip abduction    Hip adduction    Hip internal rotation  45-50 deg  Hip external rotation  Pain 60 deg   Knee flexion  WNL  Knee extension  WNL   Ankle dorsiflexion    Ankle plantarflexion    Ankle inversion    Ankle eversion     (Blank rows = not tested)  LOWER EXTREMITY MMT:    MMT Right eval Left eval Rt 05/13/24 Lt.  05/13/24  Hip flexion 4 4-    Hip extension   3-/5 3-/5  Hip abduction   3-/5 2+/5  Hip adduction  Hip internal rotation       Hip external rotation      Knee flexion 5 5 5 5   Knee extension 5 5 5 5   Ankle dorsiflexion      Ankle plantarflexion      Ankle inversion      Ankle eversion       (Blank rows = not  tested)  LUMBAR SPECIAL TESTS:  Straight leg raise test: Negative  Hip testing: Negative left scour test, positive FABER test for L anterior hip    FUNCTIONAL TESTS:  5 times sit to stand: 20 seconds no UE   GAIT: Distance walked: 100 Assistive device utilized: None Level of assistance: Modified independence Comments: Slow pace slight trunk flexion and antalgic patterning  TREATMENT DATE:  [facility] Adult PT Treatment:                                                DATE: 05/21/24 Therapeutic Exercise: Nustep L5 UE/LE x 6 minutes  Bridge x 10 Iso adduction x 10 Bridge with ball squeeze x 10 Iso clam into belt x 10  Iso clam with bridge x 10 Standing hip abduction x 10 each Standing isometric hip flexion into towel/ball - standing at counter Updated HEP    [facility] Adult PT Treatment:                                                DATE: 05/19/24 Therapeutic Exercise: Nustep L6 UE/LE x 5 minutes Bridge 5 sec 10 x 2  Iso clam using belt 5 sec x 10 Iso supine bilat abdct with belt at thighs -legs over 2 pillows for slight knee flexion Iso ball squeeze 5 sec x 10  Updated HEP     [facility] Adult PT Treatment:                                                DATE: 05/13/24 Therapeutic Exercise/Activity: NuStep L6 UE and LE  Core bracing with ball squeeze Isometric blue band abd/ER with ab bracing Mini bridge x 10 (band and ball)  SLR x 10 each LE, more difficulty on LLE, easy on Rt , VMO SLR on LLE  Hip abduction LLE , MMT PROM and hip testing  Modalities: Cold pack in sidelying L hip 8 min during self care  Self Care: Use pillows and ice pack vs using Ibuprofen  MMT hips and implication of weakness in hips   [facility] Adult PT Treatment:                                                DATE: 05/03/24 Therapeutic Activity: Nustep UE and LE for 6 min  2 min walk:  415 feet pain increased in hip and feet  Hamstring/ITB Knee to chest  Ball squeeze x 10 Bridge with ball x 10  Sidelying hip  abd Sidelying clam with GTB Sit to stand x 10 GTB around thighs   [facility]  Adult PT Treatment:                                                DATE: 04/30/24 Self Care: Relationship of the left anterior hip to the upper lumbar spine, psoas Home exercise program- hip abduction form  Importance of consistent gentle exercise and not overdoing it when you feel good Variety with exercise ( not swimming 10 days in a row), adding walking, or other cardio        Strengthening versus stretching to relieve pain                                                                                                                           PATIENT EDUCATION:  Education details: see above  Person educated: Patient Education method: Explanation, Demonstration, Verbal cues, and Handouts Education comprehension: verbalized understanding and returned demonstration  HOME EXERCISE PROGRAM: Access Code: [email or web address] URL: [email or web address] Date: 04/30/2024 Prepared by: Marci Setter  Exercises - Modified Thomas Stretch  - 1 x daily - 7 x weekly - 1 sets - 3 reps - 1-3 hold - Supine Bridge  - 1 x daily - 7 x weekly - 2 sets - 10 reps - 5 hold - Sidelying Hip Abduction  - 1 x daily - 7 x weekly - 2 sets - 10 reps - 2 hold - Standing Hip Abduction with Counter Support  - 1 x daily - 7 x weekly - 2 sets - 10 reps - 2 hold - Clamshell with Resistance  - 1 x daily - 7 x weekly - 2 sets - 10 reps - Bridge with Hip Abduction and Resistance  - 1 x daily - 7 x weekly - 2 sets - 10 reps 05/19/24 - Bridge with Hip Abduction and Resistance (BELT)  - 1 x daily - 7 x weekly - 2 sets - 10 reps - Hooklying Bilateral Isometric Clamshell (BELT)  - 1 x daily - 7 x weekly - 2 sets - 10 reps - Supine Hip Abduction isometric into belt - pillows under knees  - 1 x daily - 7 x weekly - 2 sets - 10 reps - Supine Hip Adduction Isometric with Ball  - 1 x daily - 7 x weekly - 2 sets - 10 reps - 5 hold 5/300/25:    ASSESSMENT:  CLINICAL IMPRESSION: Pt reports good tolerance to exercises last visit without increased pain. Progressed hip stability with good tolerance. Seems to be responding to isometrics. Reports less pain with weight bearing. Pt has referral for Shoulder. Added hip flexion isometric without increased pain. Discussed aquatics due to multi joint involvement and pt is interested. Will need to schedule appts after re-eval next week.   Needs balance assessment.    05/13/24: Patient did have confirmed XR with osteoarthritis, "not too bad" per PA.  She continues  to have signifnst pain in her L hip, from her L5 and wrapping to post glutes and thigh and hip.  Pain interferes with stepping up with her LLE and exercises are quite painful.  I offered her alternatives to OTC meds and tips for positioning her hips.  Focus will be on this area and patient will be seen here with the Hip referral by PA McClung.     Patient is a 68 y.o. female who was seen today for physical therapy evaluation and treatment for lower back pain, neck pain.  She is also having a significant amount of pain in her left hip which does seem related to her left iliopsoas.  She presents with signs and symptoms consistent with a tendinopathy in her gluteus medius and may have some anterior hip involvement as well.  While she was traveling walking extensively and climbing stairs she aggravated it and it continues to be the most bothersome symptom that she has.  She will be getting an x-ray in the coming week and more of an orthopedic workup.  OBJECTIVE IMPAIRMENTS: Abnormal gait, decreased activity tolerance, decreased balance, decreased mobility, difficulty walking, decreased strength, increased fascial restrictions, improper body mechanics, obesity, and pain.   ACTIVITY LIMITATIONS: carrying, lifting, bending, sitting, standing, squatting, sleeping, stairs, transfers, bed mobility, locomotion level, and caring for  others  PARTICIPATION LIMITATIONS: cleaning, laundry, interpersonal relationship, shopping, community activity, and occupation  PERSONAL FACTORS: Time since onset of injury/illness/exacerbation, Transportation, and 3+ comorbidities: Fibromyalgia, knee surgeries, anxiety are also affecting patient's functional outcome.   REHAB POTENTIAL: Excellent  CLINICAL DECISION MAKING: Evolving/moderate complexity  EVALUATION COMPLEXITY: Moderate   GOALS: Goals reviewed with patient? Yes  SHORT TERM GOALS: Target date: 05/21/2024   Patient will be able to show independence for initial HEP to include posture, core and hip strength and stability.  Baseline: Goal status: MET   2.  Patient will be able to complete 2 min walk test distance for baseline pain and endurance assessment and goal set  Baseline: 415  Goal status: MET   3.  Pt will complete balance assessment and set goal  Baseline:  Goal status: INITIAL   LONG TERM GOALS: Target date: 06/25/2024    Patient will be able to improve 2 min walk test distance by 50 feet or more with LRAD and no increase in pain.  Baseline:  Goal status: INITIAL  2.  Patient will be able to demonstrate hip/knee/ankle strength to 5/5 in order to maximize functional mobility, ambulation and lifting.   Baseline:  Goal status: INITIAL  3.  Patient will be independent with final HEP upon discharge from PT and report consistent benefit following exercise completion.   Baseline:  Goal status: INITIAL  4.  Patient will be able to improve 2 min walk test distance by 50 feet or more with LRAD and no increase in pain.  Baseline: 415 feet  Goal status: INITIAL  5.  Patient will be able to ambulate in the community > 1000 feet with mod I using least restrictive assistive device and min to no discernable limp, pain managed to 4/10.  Baseline:  Goal status: INITIAL  6.  Pt will demo L hip abduction to > 4/5 or more in order to support her with ambulation,  walking  Baseline:  Goal status: INITIAL  PLAN:  PT FREQUENCY: 2x/week  PT DURATION: 8 weeks  PLANNED INTERVENTIONS: 97164- PT Re-evaluation, 97750- Physical Performance Testing, 97110-Therapeutic exercises, 97530- Therapeutic activity, [id number]- Neuromuscular re-education, 97535- Self Care, [id number]-  Manual therapy, [phone]- Gait training, Patient/Family education, Balance training, Stair training, Taping, Dry Needling, Cryotherapy, and Moist heat.  PLAN FOR NEXT SESSION: sit to stand with band (in HEP now), balance assessment.  Hip abd, glute strength as tol.  Ice post. Check HEP, consider DN for L ant hip vs post hip, core stability, NuStep, (TUG?)   Gasper Karst, PTA 05/21/24 10:25 AM Phone: [phone] Fax: [phone]

## 2024-05-24 ENCOUNTER — Encounter: Payer: Self-pay | Admitting: Physical Therapy

## 2024-05-24 ENCOUNTER — Ambulatory Visit: Attending: Nurse Practitioner | Admitting: Physical Therapy

## 2024-05-24 DIAGNOSIS — G8929 Other chronic pain: Secondary | ICD-10-CM | POA: Insufficient documentation

## 2024-05-24 DIAGNOSIS — M25511 Pain in right shoulder: Secondary | ICD-10-CM | POA: Diagnosis not present

## 2024-05-24 DIAGNOSIS — M25552 Pain in left hip: Secondary | ICD-10-CM | POA: Diagnosis not present

## 2024-05-24 DIAGNOSIS — M353 Polymyalgia rheumatica: Secondary | ICD-10-CM | POA: Insufficient documentation

## 2024-05-24 DIAGNOSIS — M6281 Muscle weakness (generalized): Secondary | ICD-10-CM | POA: Diagnosis not present

## 2024-05-24 DIAGNOSIS — R278 Other lack of coordination: Secondary | ICD-10-CM | POA: Diagnosis not present

## 2024-05-24 DIAGNOSIS — R262 Difficulty in walking, not elsewhere classified: Secondary | ICD-10-CM | POA: Diagnosis not present

## 2024-05-24 DIAGNOSIS — M5459 Other low back pain: Secondary | ICD-10-CM | POA: Insufficient documentation

## 2024-05-24 NOTE — Therapy (Signed)
 OUTPATIENT PHYSICAL THERAPY NOTE  Patient Name: Betty Holmes MRN: [id number] DOB:1956/05/12, 68 y.o., female Today's Date: 05/24/2024  END OF SESSION:  PT End of Session - 05/24/24 0853     Visit Number 6    Number of Visits 16    Date for PT Re-Evaluation 06/25/24    Authorization Type [employer] Dual complete, MCD    PT Start Time 0850    PT Stop Time 0930    PT Time Calculation (min) 40 min    Activity Tolerance Patient tolerated treatment well    Behavior During Therapy WFL for tasks assessed/performed              Past Medical History:  Diagnosis Date   Anxiety    Arthritis    At high risk for tick borne illness    had abx--test her again--she was okey---at [facility]   Depression    Fibromyalgia    Fibromyalgia    Past Surgical History:  Procedure Laterality Date   cataracts removal (bilateral) Bilateral    CESAREAN SECTION     CYST EXCISION Right 05/09/2017   Procedure: EXCISION RIGHT INGUINAL CYST;  Surgeon: Adalberto Acton, MD;  Location: [facility];  Service: General;  Laterality: Right;   JOINT REPLACEMENT     KNEE ARTHROSCOPY     KNEE SURGERY     OVARIAN CYST REMOVAL     Patient Active Problem List   Diagnosis Date Noted   Diverticulosis 04/13/2024   Urinary incontinence, mixed 07/15/2023   Constipation, chronic 10/14/2022   Obesity 10/30/2021   S/P cataract extraction 10/30/2021   Candidal intertrigo 08/22/2021   GAD (generalized anxiety disorder) 09/21/2019   Pain in left wrist 07/24/2017   Painful wrist, right 07/24/2017   Osteopenia 04/21/2017   Cervical stenosis of spine 04/21/2017   Vitamin D  deficiency 04/21/2017   Recurrent major depressive disorder, in partial remission (HCC) 04/09/2017   Chronic bilateral low back pain 04/09/2017   Fibromyalgia     PCP: Kandace Organ NP   REFERRING PROVIDER: Kandace Organ NP Rennis Case PA ([place])   REFERRING DIAG:  Diagnosis  M54.50,G89.29  (ICD-10-CM) - Chronic bilateral low back pain without sciatica  M48.02 (ICD-10-CM) - Cervical stenosis of spine  M16.12 (ICD-10-CM) - Unilateral primary osteoarthritis, left hip  Rationale for Evaluation and Treatment: Rehabilitation  THERAPY DIAG:  Pain in left hip  Other low back pain  Polymyalgia (HCC)  Difficulty in walking, not elsewhere classified  Muscle weakness (generalized)  ONSET DATE: back yrs, L hip March?   SUBJECTIVE:  SUBJECTIVE STATEMENT: 05/21/24: I took the medicine, a tiny bit of shoulder and hip.  I am doing the exercises.  I know I am getting benefit from that.   PERTINENT HISTORY:  Chronic joint pain and stiffness, worse with activity, no joint swelling or erythema Unable to tolerate the following med in the past: Cymbalta caused increased anxiety and insomnia, Lyrica  caused withdrawal symptoms when dose was missed, and Gabapentin caused headache  PAIN:  Are you having pain? Yes: NPRS scale: min  Pain location: low back and L hip (anterior/groin)  Pain description: stiff, sharp in her L hip  Aggravating factors: activity, standing on LLE, sitting too long, walking gets a bit better then it gets worse (30 min)    Relieving factors: Ibu, BC powder, heating pad   Shoulder pain Rt anterior/superior  2/10 with movement Aggs:Cooking, lifting, reaching Alleviate: rest, meds, ice   PRECAUTIONS: None  RED FLAGS: None   WEIGHT BEARING RESTRICTIONS: No  FALLS:  Has patient fallen in last 6 months? No She does reports decreased confidence with taking the bus, stepping off curb.   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   NEXT MD VISIT: Sees [facility] for L hip in about 2 weeks   OBJECTIVE:  Note: Objective measures were completed at Evaluation unless otherwise noted.  DIAGNOSTIC FINDINGS:  None recent   PATIENT SURVEYS:  Modified Oswestry 19/50     Needs LEFS?   COGNITION: Overall cognitive status: Within functional limits for tasks assessed     SENSATION: WFL  MUSCLE LENGTH: Hamstrings: good Thomas test: not measured but L sided stretch addressed painful area   POSTURE: rounded shoulders, forward head, and increased thoracic kyphosis  PALPATION: TTP on Rt lateral hip Pain with palpation to left posterior hip, left rectus femoris and left iliopsoas    UPPER EXTREMITY ROM:   Active ROM Right 05/24/2024 Left 05/24/2024  Shoulder flexion 132 150+  Shoulder extension    Shoulder abduction 95 full  Shoulder adduction    Shoulder internal rotation Pain: FR to mid back    Shoulder external rotation Min pain, 90+   Elbow flexion    Elbow extension    Wrist flexion    Wrist extension    Wrist ulnar deviation    Wrist radial deviation    Wrist pronation    Wrist supination    (Blank rows = not tested)  UPPER EXTREMITY MMT:  MMT Right 05/24/2024 Left 05/24/2024  Shoulder flexion 3+/5 4/5  Shoulder extension    Shoulder abduction 3+/5 4/5  Shoulder adduction    Shoulder internal rotation 4- pain    Shoulder external rotation 4- pain    Grip strength (lbs)    (Blank rows = not tested)  SHOULDER SPECIAL TESTS:  Impingement tests: Neer impingement test: positive   PALPATION:  Gross pain Rt shoulder , pressure to humeral head "felt good"   LUMBAR ROM:   AROM eval  Flexion WFL  Extension 75% limited   Right lateral flexion WFL  Left lateral flexion WFL  Right rotation WFL  Left rotation WFL   (Blank rows = not tested)  LOWER EXTREMITY ROM:    WNL - pt with borderline hypermobility in her  hip IR  Passive  Right eval Left 05/13/24 eval  Hip flexion  Full , pain ant hip  Hip extension    Hip abduction    Hip adduction    Hip internal rotation  45-50 deg  Hip external rotation  Pain 60 deg   Knee flexion  WNL  Knee extension  WNL   Ankle dorsiflexion    Ankle plantarflexion    Ankle inversion    Ankle eversion     (Blank rows = not tested)  LOWER EXTREMITY MMT:    MMT Right eval Left eval Rt 05/13/24 Lt.  05/13/24  Hip flexion 4 4-    Hip extension   3-/5 3-/5  Hip abduction   3-/5 2+/5  Hip adduction      Hip internal rotation       Hip external rotation      Knee flexion 5 5 5 5   Knee extension 5 5 5 5   Ankle dorsiflexion      Ankle plantarflexion      Ankle inversion      Ankle eversion       (Blank rows = not tested)  LUMBAR SPECIAL TESTS:  Straight leg raise test: Negative  Hip testing: Negative left scour test, positive FABER test for L anterior hip    FUNCTIONAL TESTS:  5 times sit to stand: 20 seconds no UE   GAIT: Distance walked: 100 Assistive device utilized: None Level of assistance: Modified independence Comments: Slow pace slight trunk flexion and antalgic patterning  TREATMENT DATE:   [facility] Adult PT Treatment:                                                DATE: 05/24/24  Therapeutic Activity: PT assessed Rt shoulder ROM, MMT and palpation Core bracing Bracing Red looped band ER isometric x 10, 5 sec hold Chest press with band  OH lift red band Corner Stretch x 3     05/24/24 0001  Dynamic Gait Index  Level Surface 2 (toes turns in, narrow)  Change in Gait Speed 3  Gait with Horizontal Head Turns 3  Gait with Vertical Head Turns 3  Gait and Pivot Turn 3  Step Over Obstacle 2  Step Around Obstacles 3  Steps 2  Total Score 21  DGI comment: toes in Lt.> Rt.      [facility] Adult PT Treatment:                                                DATE: 05/21/24 Therapeutic  Exercise: Nustep L5 UE/LE x 6 minutes  Bridge x 10 Iso adduction x 10 Bridge with ball squeeze x 10 Iso clam into belt x 10  Iso clam with bridge x 10 Standing hip abduction x 10 each Standing isometric hip flexion into towel/ball - standing at counter Updated HEP    [facility] Adult PT Treatment:                                                DATE: 05/19/24 Therapeutic Exercise: Nustep L6 UE/LE x 5  minutes Bridge 5 sec 10 x 2  Iso clam using belt 5 sec x 10 Iso supine bilat abdct with belt at thighs -legs over 2 pillows for slight knee flexion Iso ball squeeze 5 sec x 10  Updated HEP     [facility] Adult PT Treatment:                                                DATE: 05/13/24 Therapeutic Exercise/Activity: NuStep L6 UE and LE  Core bracing with ball squeeze Isometric blue band abd/ER with ab bracing Mini bridge x 10 (band and ball)  SLR x 10 each LE, more difficulty on LLE, easy on Rt , VMO SLR on LLE  Hip abduction LLE , MMT PROM and hip testing  Modalities: Cold pack in sidelying L hip 8 min during self care  Self Care: Use pillows and ice pack vs using Ibuprofen  MMT hips and implication of weakness in hips    PATIENT EDUCATION:  Education details: see above  Person educated: Patient Education method: Explanation, Demonstration, Verbal cues, and Handouts Education comprehension: verbalized understanding and returned demonstration  HOME EXERCISE PROGRAM: Access Code: [email or web address] URL: [email or web address] Date: 04/30/2024 Prepared by: Marci Setter  Access Code: [email or web address] URL: [email or web address] Date: 05/24/2024 Prepared by: Marci Setter  Exercises - Modified Thomas Stretch  - 1 x daily - 7 x weekly - 1 sets - 3 reps - 1-3 hold - Supine Bridge  - 1 x daily - 7 x weekly - 2 sets - 10 reps - 5 hold - Sidelying Hip Abduction  - 1 x daily - 7 x weekly - 2 sets - 10 reps - 2 hold - Standing Hip Abduction with Counter Support  - 1 x daily - 7 x  weekly - 2 sets - 10 reps - 2 hold - Clamshell with Resistance  - 1 x daily - 7 x weekly - 2 sets - 10 reps - Sit to Stand with Resistance Around Legs  - 1 x daily - 7 x weekly - 2 sets - 10 reps - Bridge with Hip Abduction and Resistance  - 1 x daily - 7 x weekly - 2 sets - 10 reps - Hooklying Bilateral Isometric Clamshell  - 1 x daily - 7 x weekly - 2 sets - 10 reps - Supine Hip Abduction  - 1 x daily - 7 x weekly - 2 sets - 10 reps - Supine Hip Adduction Isometric with Ball  - 1 x daily - 7 x weekly - 2 sets - 10 reps - 5 hold - Standing Isometric Hip Flexion with Ball at [employer]  - 1 x daily - 7 x weekly - 1 sets - 10 reps - 5 hold - Shoulder External Rotation and Scapular Retraction with Resistance  - 1 x daily - 7 x weekly - 2 sets - 10 reps - 5 hold - Shoulder Flexion Serratus Activation with Resistance  - 1 x daily - 7 x weekly - 2 sets - 10 reps - 5 hold ASSESSMENT:  CLINICAL IMPRESSION: Patient scored a 21 out of 26 on her DGI.  She does exhibit decreased heel strike and increased toe in with ambulation.  She sometimes gets a bit tripped up on her toe when she is walking fast or making quick changes.  Assessed the right shoulder today it seems she may have  some impingement.  She has tightness in her posterior capsule, pain with all directions of active range of motion and weakness throughout.  We will integrate her right shoulder into her PT episode which is already addressing her hip and her back.  She is pleased with her progress so far and has been diligent with her home exercise program.  She has overall less pain today because she did take her medication she is resistant to using it every day but understands that it is benefiting her to take it prior to therapy.    Patient is a 68 y.o. female who was seen today for physical therapy evaluation and treatment for lower back pain, neck pain.  She is also having a significant amount of pain in her left hip which does seem related to her left  iliopsoas.  She presents with signs and symptoms consistent with a tendinopathy in her gluteus medius and may have some anterior hip involvement as well.  While she was traveling walking extensively and climbing stairs she aggravated it and it continues to be the most bothersome symptom that she has.  She will be getting an x-ray in the coming week and more of an orthopedic workup.  OBJECTIVE IMPAIRMENTS: Abnormal gait, decreased activity tolerance, decreased balance, decreased mobility, difficulty walking, decreased strength, increased fascial restrictions, improper body mechanics, obesity, and pain.   ACTIVITY LIMITATIONS: carrying, lifting, bending, sitting, standing, squatting, sleeping, stairs, transfers, bed mobility, locomotion level, and caring for others  PARTICIPATION LIMITATIONS: cleaning, laundry, interpersonal relationship, shopping, community activity, and occupation  PERSONAL FACTORS: Time since onset of injury/illness/exacerbation, Transportation, and 3+ comorbidities: Fibromyalgia, knee surgeries, anxiety are also affecting patient's functional outcome.   REHAB POTENTIAL: Excellent  CLINICAL DECISION MAKING: Evolving/moderate complexity  EVALUATION COMPLEXITY: Moderate   GOALS: Goals reviewed with patient? Yes  SHORT TERM GOALS: Target date: 05/21/2024   Patient will be able to show independence for initial HEP to include posture, core and hip strength and stability.  Baseline: Goal status: MET   2.  Patient will be able to complete 2 min walk test distance for baseline pain and endurance assessment and goal set  Baseline: 415  Goal status: MET   3.  Pt will complete balance assessment and set goal  Baseline:  Goal status: INITIAL   LONG TERM GOALS: Target date: 06/25/2024    Patient will be able to improve 2 min walk test distance by 50 feet or more with LRAD and no increase in pain.  Baseline:  Goal status: INITIAL  2.  Patient will be able to demonstrate  hip/knee/ankle strength to 5/5 in order to maximize functional mobility, ambulation and lifting.   Baseline:  Goal status: INITIAL  3.  Patient will be independent with final HEP upon discharge from PT and report consistent benefit following exercise completion.   Baseline:  Goal status: INITIAL  4.  Patient will be able to demonstrate right shoulder strength to 4+/5 in with min pain for optimal use of her dominant arm with ADLs and IADLs.  Baseline: 3+/5 Goal status: NEW   5.  Patient will be able to ambulate in the community > 1000 feet with mod I using least restrictive assistive device and min to no discernable limp, pain managed to 4/10.  Baseline:  Goal status: INITIAL  6.  Pt will demo L hip abduction to > 4/5 or more in order to support her with ambulation, walking  Baseline:  Goal status: INITIAL   7. Pt will improve  her DGI score to 24 out of 26 indicating greater gait stability Baseline: 21/26 Goal status: NEW PLAN:  PT FREQUENCY: 2x/week  PT DURATION: 8 weeks  PLANNED INTERVENTIONS: 97164- PT Re-evaluation, 97750- Physical Performance Testing, 97110-Therapeutic exercises, 97530- Therapeutic activity, [id number]- Neuromuscular re-education, 97535- Self Care, 16109- Manual therapy, [id number]- Gait training, Patient/Family education, Balance training, Stair training, Taping, Dry Needling, Cryotherapy, and Moist heat.  PLAN FOR NEXT SESSION: add shoulder to standing, wall ex as able . sit to stand with band (in HEP now), Hip abd, glute strength as tol.  Ice post. Check HEP, consider DN for L ant hip vs post hip, core stability, NuStep, (TUG?)

## 2024-05-25 NOTE — Therapy (Unsigned)
 OUTPATIENT PHYSICAL THERAPY NOTE  Patient Name: Betty Holmes MRN: [id number] DOB:[birth year], 68 y.o., female Today's Date: 05/25/2024  END OF SESSION:     Past Medical History:  Diagnosis Date   Anxiety    Arthritis    At high risk for tick borne illness    had abx--test her again--she was okey---at [facility]   Depression    Fibromyalgia    Fibromyalgia    Past Surgical History:  Procedure Laterality Date   cataracts removal (bilateral) Bilateral    CESAREAN SECTION     CYST EXCISION Right 05/09/2017   Procedure: EXCISION RIGHT INGUINAL CYST;  Surgeon: Adalberto Acton, MD;  Location: [facility];  Service: General;  Laterality: Right;   JOINT REPLACEMENT     KNEE ARTHROSCOPY     KNEE SURGERY     OVARIAN CYST REMOVAL     Patient Active Problem List   Diagnosis Date Noted   Diverticulosis 04/13/2024   Urinary incontinence, mixed 07/15/2023   Constipation, chronic 10/14/2022   Obesity 10/30/2021   S/P cataract extraction 10/30/2021   Candidal intertrigo 08/22/2021   GAD (generalized anxiety disorder) 09/21/2019   Pain in left wrist 07/24/2017   Painful wrist, right 07/24/2017   Osteopenia 04/21/2017   Cervical stenosis of spine 04/21/2017   Vitamin D  deficiency 04/21/2017   Recurrent major depressive disorder, in partial remission (HCC) 04/09/2017   Chronic bilateral low back pain 04/09/2017   Fibromyalgia     PCP: Kandace Organ NP   REFERRING PROVIDER: Kandace Organ NP Rennis Case PA ([place])   REFERRING DIAG:  Diagnosis  M54.50,G89.29 (ICD-10-CM) - Chronic bilateral low back pain without sciatica  M48.02 (ICD-10-CM) - Cervical stenosis of spine  M16.12 (ICD-10-CM) - Unilateral primary osteoarthritis, left hip  Rationale for Evaluation and Treatment: Rehabilitation  THERAPY DIAG:  No diagnosis found.  ONSET DATE: back yrs, L hip March?   SUBJECTIVE:                                                                                                                                                                                            SUBJECTIVE STATEMENT: 05/21/24: I took the medicine, a tiny bit of shoulder and hip.  I am doing the exercises.  I know I am getting benefit from that.   PERTINENT HISTORY:  Chronic joint pain and stiffness, worse with activity, no joint swelling or erythema Unable to tolerate the following med in the past: Cymbalta caused increased anxiety and insomnia, Lyrica  caused withdrawal symptoms when dose was missed, and Gabapentin caused headache  PAIN:  Are you having pain?  Yes: NPRS scale: min  Pain location: low back and L hip (anterior/groin)  Pain description: stiff, sharp in her L hip  Aggravating factors: activity, standing on LLE, sitting too long, walking gets a bit better then it gets worse (30 min)    Relieving factors: Ibu, BC powder, heating pad   Shoulder pain Rt anterior/superior  2/10 with movement Aggs:Cooking, lifting, reaching Alleviate: rest, meds, ice   PRECAUTIONS: None  RED FLAGS: None   WEIGHT BEARING RESTRICTIONS: No  FALLS:  Has patient fallen in last 6 months? No She does reports decreased confidence with taking the bus, stepping off curb.   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   NEXT MD VISIT: Sees [facility] for L hip in about 2 weeks   OBJECTIVE:  Note: Objective measures were completed at Evaluation unless otherwise noted.  DIAGNOSTIC FINDINGS:  None recent   PATIENT SURVEYS:  Modified Oswestry 19/50     Needs LEFS?   COGNITION: Overall cognitive status: Within functional limits for tasks assessed     SENSATION: WFL  MUSCLE  LENGTH: Hamstrings: good Thomas test: not measured but L sided stretch addressed painful area   POSTURE: rounded shoulders, forward head, and increased thoracic kyphosis  PALPATION: TTP on Rt lateral hip Pain with palpation to left posterior hip, left rectus femoris and left iliopsoas    UPPER EXTREMITY ROM:   Active ROM Right 05/25/2024 Left 05/25/2024  Shoulder flexion 132 150+  Shoulder extension    Shoulder abduction 95 full  Shoulder adduction    Shoulder internal rotation Pain: FR to mid back    Shoulder external rotation Min pain, 90+   Elbow flexion    Elbow extension    Wrist flexion    Wrist extension    Wrist ulnar deviation    Wrist radial deviation    Wrist pronation    Wrist supination    (Blank rows = not tested)  UPPER EXTREMITY MMT:  MMT Right 05/25/2024 Left 05/25/2024  Shoulder flexion 3+/5 4/5  Shoulder extension    Shoulder abduction 3+/5 4/5  Shoulder adduction    Shoulder internal rotation 4- pain    Shoulder external rotation 4- pain    Grip strength (lbs)    (Blank rows = not tested)  SHOULDER SPECIAL TESTS:  Impingement tests: Neer impingement test: positive   PALPATION:  Gross pain Rt shoulder , pressure to humeral head "felt good"   LUMBAR ROM:   AROM eval  Flexion WFL  Extension 75% limited   Right lateral flexion WFL  Left lateral flexion WFL  Right rotation WFL  Left rotation WFL   (Blank rows = not tested)  LOWER EXTREMITY ROM:   WNL - pt with borderline hypermobility in her hip IR  Passive  Right eval Left 05/13/24 eval  Hip flexion  Full , pain ant hip  Hip extension    Hip abduction    Hip adduction    Hip internal rotation  45-50 deg  Hip external rotation  Pain 60 deg   Knee flexion  WNL  Knee extension  WNL   Ankle dorsiflexion    Ankle plantarflexion    Ankle inversion    Ankle eversion     (  Blank rows = not tested)  LOWER EXTREMITY MMT:    MMT Right eval Left eval Rt 05/13/24 Lt.  05/13/24  Hip  flexion 4 4-    Hip extension   3-/5 3-/5  Hip abduction   3-/5 2+/5  Hip adduction      Hip internal rotation       Hip external rotation      Knee flexion 5 5 5 5   Knee extension 5 5 5 5   Ankle dorsiflexion      Ankle plantarflexion      Ankle inversion      Ankle eversion       (Blank rows = not tested)  LUMBAR SPECIAL TESTS:  Straight leg raise test: Negative  Hip testing: Negative left scour test, positive FABER test for L anterior hip    FUNCTIONAL TESTS:  5 times sit to stand: 20 seconds no UE   GAIT: Distance walked: 100 Assistive device utilized: None Level of assistance: Modified independence Comments: Slow pace slight trunk flexion and antalgic patterning  TREATMENT DATE:   [facility] Adult PT Treatment:                                                DATE: 05/24/24  Therapeutic Activity: PT assessed Rt shoulder ROM, MMT and palpation Core bracing Bracing Red looped band ER isometric x 10, 5 sec hold Chest press with band  OH lift red band Corner Stretch x 3     05/24/24 0001  Dynamic Gait Index  Level Surface 2 (toes turns in, narrow)  Change in Gait Speed 3  Gait with Horizontal Head Turns 3  Gait with Vertical Head Turns 3  Gait and Pivot Turn 3  Step Over Obstacle 2  Step Around Obstacles 3  Steps 2  Total Score 21  DGI comment: toes in Lt.> Rt.      [facility] Adult PT Treatment:                                                DATE: 05/21/24 Therapeutic Exercise: Nustep L5 UE/LE x 6 minutes  Bridge x 10 Iso adduction x 10 Bridge with ball squeeze x 10 Iso clam into belt x 10  Iso clam with bridge x 10 Standing hip abduction x 10 each Standing isometric hip flexion into towel/ball - standing at counter Updated HEP    [facility] Adult PT Treatment:                                                DATE: 05/19/24 Therapeutic Exercise: Nustep L6 UE/LE x 5 minutes Bridge 5 sec 10 x 2  Iso clam using belt 5 sec x 10 Iso supine bilat abdct with belt at  thighs -legs over 2 pillows for slight knee flexion Iso ball squeeze 5 sec x 10  Updated HEP     [facility] Adult PT Treatment:  DATE: 05/13/24 Therapeutic Exercise/Activity: NuStep L6 UE and LE  Core bracing with ball squeeze Isometric blue band abd/ER with ab bracing Mini bridge x 10 (band and ball)  SLR x 10 each LE, more difficulty on LLE, easy on Rt , VMO SLR on LLE  Hip abduction LLE , MMT PROM and hip testing  Modalities: Cold pack in sidelying L hip 8 min during self care  Self Care: Use pillows and ice pack vs using Ibuprofen  MMT hips and implication of weakness in hips    PATIENT EDUCATION:  Education details: see above  Person educated: Patient Education method: Explanation, Demonstration, Verbal cues, and Handouts Education comprehension: verbalized understanding and returned demonstration  HOME EXERCISE PROGRAM: Access Code: [email or web address] URL: [email or web address] Date: 04/30/2024 Prepared by: Marci Setter  Access Code: [email or web address] URL: [email or web address] Date: 05/24/2024 Prepared by: Marci Setter  Exercises - Modified Thomas Stretch  - 1 x daily - 7 x weekly - 1 sets - 3 reps - 1-3 hold - Supine Bridge  - 1 x daily - 7 x weekly - 2 sets - 10 reps - 5 hold - Sidelying Hip Abduction  - 1 x daily - 7 x weekly - 2 sets - 10 reps - 2 hold - Standing Hip Abduction with Counter Support  - 1 x daily - 7 x weekly - 2 sets - 10 reps - 2 hold - Clamshell with Resistance  - 1 x daily - 7 x weekly - 2 sets - 10 reps - Sit to Stand with Resistance Around Legs  - 1 x daily - 7 x weekly - 2 sets - 10 reps - Bridge with Hip Abduction and Resistance  - 1 x daily - 7 x weekly - 2 sets - 10 reps - Hooklying Bilateral Isometric Clamshell  - 1 x daily - 7 x weekly - 2 sets - 10 reps - Supine Hip Abduction  - 1 x daily - 7 x weekly - 2 sets - 10 reps - Supine Hip Adduction Isometric with Ball  - 1 x daily - 7 x  weekly - 2 sets - 10 reps - 5 hold - Standing Isometric Hip Flexion with Ball at [employer]  - 1 x daily - 7 x weekly - 1 sets - 10 reps - 5 hold - Shoulder External Rotation and Scapular Retraction with Resistance  - 1 x daily - 7 x weekly - 2 sets - 10 reps - 5 hold - Shoulder Flexion Serratus Activation with Resistance  - 1 x daily - 7 x weekly - 2 sets - 10 reps - 5 hold ASSESSMENT:  CLINICAL IMPRESSION: Patient scored a 21 out of 26 on her DGI.  She does exhibit decreased heel strike and increased toe in with ambulation.  She sometimes gets a bit tripped up on her toe when she is walking fast or making quick changes.  Assessed the right shoulder today it seems she may have some impingement.  She has tightness in her posterior capsule, pain with all directions of active range of motion and weakness throughout.  We will integrate her right shoulder into her PT episode which is already addressing her hip and her back.  She is pleased with her progress so far and has been diligent with her home exercise program.  She has overall less pain today because she did take her medication she is resistant to using it every day but understands that it is benefiting her to take it prior to therapy.  Patient is a 68 y.o. female who was seen today for physical therapy evaluation and treatment for lower back pain, neck pain.  She is also having a significant amount of pain in her left hip which does seem related to her left iliopsoas.  She presents with signs and symptoms consistent with a tendinopathy in her gluteus medius and may have some anterior hip involvement as well.  While she was traveling walking extensively and climbing stairs she aggravated it and it continues to be the most bothersome symptom that she has.  She will be getting an x-ray in the coming week and more of an orthopedic workup.  OBJECTIVE IMPAIRMENTS: Abnormal gait, decreased activity tolerance, decreased balance, decreased mobility, difficulty  walking, decreased strength, increased fascial restrictions, improper body mechanics, obesity, and pain.   ACTIVITY LIMITATIONS: carrying, lifting, bending, sitting, standing, squatting, sleeping, stairs, transfers, bed mobility, locomotion level, and caring for others  PARTICIPATION LIMITATIONS: cleaning, laundry, interpersonal relationship, shopping, community activity, and occupation  PERSONAL FACTORS: Time since onset of injury/illness/exacerbation, Transportation, and 3+ comorbidities: Fibromyalgia, knee surgeries, anxiety are also affecting patient's functional outcome.   REHAB POTENTIAL: Excellent  CLINICAL DECISION MAKING: Evolving/moderate complexity  EVALUATION COMPLEXITY: Moderate   GOALS: Goals reviewed with patient? Yes  SHORT TERM GOALS: Target date: 05/21/2024   Patient will be able to show independence for initial HEP to include posture, core and hip strength and stability.  Baseline: Goal status: MET   2.  Patient will be able to complete 2 min walk test distance for baseline pain and endurance assessment and goal set  Baseline: 415  Goal status: MET   3.  Pt will complete balance assessment and set goal  Baseline:  Goal status: INITIAL   LONG TERM GOALS: Target date: 06/25/2024    Patient will be able to improve 2 min walk test distance by 50 feet or more with LRAD and no increase in pain.  Baseline:  Goal status: INITIAL  2.  Patient will be able to demonstrate hip/knee/ankle strength to 5/5 in order to maximize functional mobility, ambulation and lifting.   Baseline:  Goal status: INITIAL  3.  Patient will be independent with final HEP upon discharge from PT and report consistent benefit following exercise completion.   Baseline:  Goal status: INITIAL  4.  Patient will be able to demonstrate right shoulder strength to 4+/5 in with min pain for optimal use of her dominant arm with ADLs and IADLs.  Baseline: 3+/5 Goal status: NEW   5.  Patient will  be able to ambulate in the community > 1000 feet with mod I using least restrictive assistive device and min to no discernable limp, pain managed to 4/10.  Baseline:  Goal status: INITIAL  6.  Pt will demo L hip abduction to > 4/5 or more in order to support her with ambulation, walking  Baseline:  Goal status: INITIAL   7. Pt will improve her DGI score to 24 out of 26 indicating greater gait stability Baseline: 21/26 Goal status: NEW PLAN:  PT FREQUENCY: 2x/week  PT DURATION: 8 weeks  PLANNED INTERVENTIONS: 97164- PT Re-evaluation, 97750- Physical Performance Testing, 97110-Therapeutic exercises, 97530- Therapeutic activity, [id number]- Neuromuscular re-education, 97535- Self Care, 16109- Manual therapy, [phone]- Gait training, Patient/Family education, Balance training, Stair training, Taping, Dry Needling, Cryotherapy, and Moist heat.  PLAN FOR NEXT SESSION: add shoulder to standing, wall ex as able . sit to stand with band (in HEP now), Hip abd, glute strength as tol.  Ice  post. Check HEP, consider DN for L ant hip vs post hip, core stability, NuStep, (TUG?)

## 2024-05-26 ENCOUNTER — Ambulatory Visit: Admitting: Physical Therapy

## 2024-05-26 ENCOUNTER — Encounter: Payer: Self-pay | Admitting: Physical Therapy

## 2024-05-26 DIAGNOSIS — R278 Other lack of coordination: Secondary | ICD-10-CM

## 2024-05-26 DIAGNOSIS — M25511 Pain in right shoulder: Secondary | ICD-10-CM | POA: Diagnosis not present

## 2024-05-26 DIAGNOSIS — G8929 Other chronic pain: Secondary | ICD-10-CM | POA: Diagnosis not present

## 2024-05-26 DIAGNOSIS — M25552 Pain in left hip: Secondary | ICD-10-CM

## 2024-05-26 DIAGNOSIS — R262 Difficulty in walking, not elsewhere classified: Secondary | ICD-10-CM

## 2024-05-26 DIAGNOSIS — M6281 Muscle weakness (generalized): Secondary | ICD-10-CM | POA: Diagnosis not present

## 2024-05-26 DIAGNOSIS — M353 Polymyalgia rheumatica: Secondary | ICD-10-CM | POA: Diagnosis not present

## 2024-05-26 DIAGNOSIS — M5459 Other low back pain: Secondary | ICD-10-CM

## 2024-05-27 ENCOUNTER — Ambulatory Visit

## 2024-06-03 ENCOUNTER — Encounter: Admitting: Physical Therapy

## 2024-06-03 ENCOUNTER — Ambulatory Visit: Payer: Self-pay | Admitting: Physical Therapy

## 2024-06-03 ENCOUNTER — Encounter: Payer: Self-pay | Admitting: Physical Therapy

## 2024-06-03 DIAGNOSIS — M25552 Pain in left hip: Secondary | ICD-10-CM

## 2024-06-03 DIAGNOSIS — R278 Other lack of coordination: Secondary | ICD-10-CM | POA: Diagnosis not present

## 2024-06-03 DIAGNOSIS — M5459 Other low back pain: Secondary | ICD-10-CM | POA: Diagnosis not present

## 2024-06-03 DIAGNOSIS — M6281 Muscle weakness (generalized): Secondary | ICD-10-CM | POA: Diagnosis not present

## 2024-06-03 DIAGNOSIS — M25511 Pain in right shoulder: Secondary | ICD-10-CM | POA: Diagnosis not present

## 2024-06-03 DIAGNOSIS — M353 Polymyalgia rheumatica: Secondary | ICD-10-CM | POA: Diagnosis not present

## 2024-06-03 DIAGNOSIS — R262 Difficulty in walking, not elsewhere classified: Secondary | ICD-10-CM | POA: Diagnosis not present

## 2024-06-03 DIAGNOSIS — G8929 Other chronic pain: Secondary | ICD-10-CM | POA: Diagnosis not present

## 2024-06-03 NOTE — Therapy (Signed)
 OUTPATIENT PHYSICAL THERAPY NOTE  Patient Name: Betty Holmes MRN: [id number]  DOB:10-20-1956, 68 y.o., female Today's Date: 06/03/2024  END OF SESSION:  PT End of Session - 06/03/24 1315     Visit Number 8    Number of Visits 16    Date for PT Re-Evaluation 06/25/24    Authorization Type [employer] Dual complete, MCD    PT Start Time 1315    PT Stop Time 1400    PT Time Calculation (min) 45 min            Past Medical History:  Diagnosis Date   Anxiety    Arthritis    At high risk for tick borne illness    had abx--test her again--she was okey---at [facility]   Depression    Fibromyalgia    Fibromyalgia    Past Surgical History:  Procedure Laterality Date   cataracts removal (bilateral) Bilateral    CESAREAN SECTION     CYST EXCISION Right 05/09/2017   Procedure: EXCISION RIGHT INGUINAL CYST;  Surgeon: Adalberto Acton, MD;  Location: [facility];  Service: General;  Laterality: Right;   JOINT REPLACEMENT     KNEE ARTHROSCOPY     KNEE SURGERY     OVARIAN CYST REMOVAL     Patient Active Problem List   Diagnosis Date Noted   Diverticulosis 04/13/2024   Urinary incontinence, mixed 07/15/2023   Constipation, chronic 10/14/2022   Obesity 10/30/2021   S/P cataract extraction 10/30/2021   Candidal intertrigo 08/22/2021   GAD (generalized anxiety disorder) 09/21/2019   Pain in left wrist 07/24/2017   Painful wrist, right 07/24/2017   Osteopenia 04/21/2017   Cervical stenosis of spine 04/21/2017   Vitamin D  deficiency 04/21/2017   Recurrent major depressive disorder, in partial remission (HCC) 04/09/2017   Chronic bilateral low back pain 04/09/2017   Fibromyalgia     PCP: Kandace Organ NP   REFERRING PROVIDER: Kandace Organ NP Rennis Case PA ([place])   REFERRING DIAG:  Diagnosis  M54.50,G89.29 (ICD-10-CM) - Chronic bilateral low back pain without sciatica  M48.02 (ICD-10-CM) - Cervical stenosis of spine  M16.12  (ICD-10-CM) - Unilateral primary osteoarthritis, left hip  Rationale for Evaluation and Treatment: Rehabilitation  THERAPY DIAG:  Pain in left hip  Other low back pain  ONSET DATE: back yrs, L hip March?   SUBJECTIVE:                                                                                                                                                                                           SUBJECTIVE STATEMENT: 06/03/24 Pain is  minimal in hip.  The exercises are helping me. Right shoulder still is killing me.   PERTINENT HISTORY:  Chronic joint pain and stiffness, worse with activity, no joint swelling or erythema Unable to tolerate the following med in the past: Cymbalta caused increased anxiety and insomnia, Lyrica  caused withdrawal symptoms when dose was missed, and Gabapentin caused headache  PAIN:  Are you having pain? Yes: NPRS scale: min  Pain location: low back and L hip (anterior/groin)  Pain description: stiff, sharp in her L hip  Aggravating factors: activity, standing on LLE, sitting too long, walking gets a bit better then it gets worse (30 min)    Relieving factors: Ibu, BC powder, heating pad   Shoulder pain Rt anterior/superior  2/10 with movement Aggs:Cooking, lifting, reaching Alleviate: rest, meds, ice   PRECAUTIONS: None  RED FLAGS: None   WEIGHT BEARING RESTRICTIONS: No  FALLS:  Has patient fallen in last 6 months? No She does reports decreased confidence with taking the bus, stepping off curb.   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   NEXT MD VISIT: Sees [facility] for L hip in about 2 weeks   OBJECTIVE:  Note: Objective measures were completed at  Evaluation unless otherwise noted.  DIAGNOSTIC FINDINGS:  None recent   PATIENT SURVEYS:  Modified Oswestry 19/50     Needs LEFS?   COGNITION: Overall cognitive status: Within functional limits for tasks assessed     SENSATION: WFL  MUSCLE LENGTH: Hamstrings: good Thomas test: not measured but L sided stretch addressed painful area   POSTURE: rounded shoulders, forward head, and increased thoracic kyphosis  PALPATION: TTP on Rt lateral hip Pain with palpation to left posterior hip, left rectus femoris and left iliopsoas    UPPER EXTREMITY ROM:   Active ROM Right 06/03/2024 Left 06/03/2024  Shoulder flexion 132 150+  Shoulder extension    Shoulder abduction 95 full  Shoulder adduction    Shoulder internal rotation Pain: FR to mid back    Shoulder external rotation Min pain, 90+   Elbow flexion    Elbow extension    Wrist flexion    Wrist extension    Wrist ulnar deviation    Wrist radial deviation    Wrist pronation    Wrist supination    (Blank rows = not tested)  UPPER EXTREMITY MMT:  MMT Right 06/03/2024 Left 06/03/2024  Shoulder flexion 3+/5 4/5  Shoulder extension    Shoulder abduction 3+/5 4/5  Shoulder adduction    Shoulder internal rotation 4- pain    Shoulder external rotation 4- pain    Grip strength (lbs)    (Blank rows = not tested)  SHOULDER SPECIAL TESTS:  Impingement tests: Neer impingement test: positive   PALPATION:  Gross pain Rt shoulder , pressure to humeral head felt good   LUMBAR ROM:   AROM eval  Flexion WFL  Extension 75% limited   Right lateral flexion WFL  Left lateral flexion WFL  Right rotation WFL  Left rotation WFL   (Blank rows = not tested)  LOWER EXTREMITY ROM:   WNL - pt with borderline hypermobility in her hip IR  Passive  Right eval Left 05/13/24 eval  Hip flexion  Full ,  pain ant hip  Hip extension    Hip abduction    Hip adduction    Hip internal rotation  45-50 deg  Hip external rotation  Pain  60 deg   Knee flexion  WNL  Knee extension  WNL   Ankle dorsiflexion    Ankle plantarflexion    Ankle inversion    Ankle eversion     (Blank rows = not tested)  LOWER EXTREMITY MMT:    MMT Right eval Left eval Rt 05/13/24 Lt.  05/13/24  Hip flexion 4 4-    Hip extension   3-/5 3-/5  Hip abduction   3-/5 2+/5  Hip adduction      Hip internal rotation       Hip external rotation      Knee flexion 5 5 5 5   Knee extension 5 5 5 5   Ankle dorsiflexion      Ankle plantarflexion      Ankle inversion      Ankle eversion       (Blank rows = not tested)  LUMBAR SPECIAL TESTS:  Straight leg raise test: Negative  Hip testing: Negative left scour test, positive FABER test for L anterior hip    FUNCTIONAL TESTS:  5 times sit to stand: 20 seconds no UE   GAIT: Distance walked: 100 Assistive device utilized: None Level of assistance: Modified independence Comments: Slow pace slight trunk flexion and antalgic patterning  TREATMENT DATE:  [facility] Adult PT Treatment:                                                DATE: 06/03/24 Therapeutic Exercise: Supine Red vs YTB bridge with horiz abdct pull Narrow pullovers with iso hold - red band vs yellow Supine red vs yellow bilat ER ISOMETRICS right shoulder 5 ways- give 3 way for HEP Standing cane shoulder flexion and abdct x 10 each +HEP STS x 10 4 inch step ups x 10 each - no pain 6 inch step up -increased pain left so discontinued Updated HEP     [facility] Adult PT Treatment:                                                DATE: 05/26/24 Therapeutic Exercise: NuStep Level 6 UE and LE for 8 min  Therapeutic Activity: Pilates Reformer used for LE/core strength, postural strength, lumbopelvic disassociation and core control.  Exercises included: Footwork 2 red 1 blue:  double leg Parallel and turnout , toe and heels on the bar.  Min cues for alignment.  Single leg press out 2 red 1 blue  Bridging all springs with ball x 15  Supine  Arm/Abs: 1 red 1 yellow Arcs with ball , parallel and V x 10 each cues for breathing Feet in Straps 1 red 1 yellow arcs and circles and frog squats    [facility] Adult PT Treatment:                                                DATE: 05/24/24  Therapeutic Activity: PT assessed Rt shoulder ROM,  MMT and palpation Core bracing Bracing Red looped band ER isometric x 10, 5 sec hold Chest press with band  OH lift red band Corner Stretch x 3     05/24/24 0001  Dynamic Gait Index  Level Surface 2 (toes turns in, narrow)  Change in Gait Speed 3  Gait with Horizontal Head Turns 3  Gait with Vertical Head Turns 3  Gait and Pivot Turn 3  Step Over Obstacle 2  Step Around Obstacles 3  Steps 2  Total Score 21  DGI comment: toes in Lt.> Rt.      [facility] Adult PT Treatment:                                                DATE: 05/21/24 Therapeutic Exercise: Nustep L5 UE/LE x 6 minutes  Bridge x 10 Iso adduction x 10 Bridge with ball squeeze x 10 Iso clam into belt x 10  Iso clam with bridge x 10 Standing hip abduction x 10 each Standing isometric hip flexion into towel/ball - standing at counter Updated HEP    [facility] Adult PT Treatment:                                                DATE: 05/19/24 Therapeutic Exercise: Nustep L6 UE/LE x 5 minutes Bridge 5 sec 10 x 2  Iso clam using belt 5 sec x 10 Iso supine bilat abdct with belt at thighs -legs over 2 pillows for slight knee flexion Iso ball squeeze 5 sec x 10  Updated HEP     [facility] Adult PT Treatment:                                                DATE: 05/13/24 Therapeutic Exercise/Activity: NuStep L6 UE and LE  Core bracing with ball squeeze Isometric blue band abd/ER with ab bracing Mini bridge x 10 (band and ball)  SLR x 10 each LE, more difficulty on LLE, easy on Rt , VMO SLR on LLE  Hip abduction LLE , MMT PROM and hip testing  Modalities: Cold pack in sidelying L hip 8 min during self care  Self Care: Use pillows and  ice pack vs using Ibuprofen  MMT hips and implication of weakness in hips    PATIENT EDUCATION:  Education details: see above  Person educated: Patient Education method: Explanation, Demonstration, Verbal cues, and Handouts Education comprehension: verbalized understanding and returned demonstration  HOME EXERCISE PROGRAM: Access Code: [email or web address] URL: [email or web address] Date: 04/30/2024 Prepared by: Marci Setter  Access Code: [email or web address] URL: [email or web address] Date: 05/24/2024 Prepared by: Marci Setter  Exercises - Modified Thomas Stretch  - 1 x daily - 7 x weekly - 1 sets - 3 reps - 1-3 hold - Supine Bridge  - 1 x daily - 7 x weekly - 2 sets - 10 reps - 5 hold - Sidelying Hip Abduction  - 1 x daily - 7 x weekly - 2 sets - 10 reps - 2 hold - Standing Hip Abduction with Counter Support  - 1 x daily - 7 x weekly - 2 sets -  10 reps - 2 hold - Clamshell with Resistance  - 1 x daily - 7 x weekly - 2 sets - 10 reps - Sit to Stand with Resistance Around Legs  - 1 x daily - 7 x weekly - 2 sets - 10 reps - Bridge with Hip Abduction and Resistance  - 1 x daily - 7 x weekly - 2 sets - 10 reps - Hooklying Bilateral Isometric Clamshell  - 1 x daily - 7 x weekly - 2 sets - 10 reps - Supine Hip Abduction  - 1 x daily - 7 x weekly - 2 sets - 10 reps - Supine Hip Adduction Isometric with Ball  - 1 x daily - 7 x weekly - 2 sets - 10 reps - 5 hold - Standing Isometric Hip Flexion with Ball at [employer]  - 1 x daily - 7 x weekly - 1 sets - 10 reps - 5 hold - Shoulder External Rotation and Scapular Retraction with Resistance  - 1 x daily - 7 x weekly - 2 sets - 10 reps - 5 hold - Shoulder Flexion Serratus Activation with Resistance  - 1 x daily - 7 x weekly - 2 sets - 10 reps - 5 hold   ASSESSMENT:  CLINICAL IMPRESSION: Right shoulder still very painful with sleep and repetitive movements. Introduced Financial planner with good tolerance. Unable to tolerate side lying  shoulder AROM. She feels hip is improving overall. Updated HEP. Will assess response next visit. She did like the reformer at last session.     Patient is a 68 y.o. female who was seen today for physical therapy evaluation and treatment for lower back pain, neck pain.  She is also having a significant amount of pain in her left hip which does seem related to her left iliopsoas.  She presents with signs and symptoms consistent with a tendinopathy in her gluteus medius and may have some anterior hip involvement as well.  While she was traveling walking extensively and climbing stairs she aggravated it and it continues to be the most bothersome symptom that she has.  She will be getting an x-ray in the coming week and more of an orthopedic workup.  OBJECTIVE IMPAIRMENTS: Abnormal gait, decreased activity tolerance, decreased balance, decreased mobility, difficulty walking, decreased strength, increased fascial restrictions, improper body mechanics, obesity, and pain.   ACTIVITY LIMITATIONS: carrying, lifting, bending, sitting, standing, squatting, sleeping, stairs, transfers, bed mobility, locomotion level, and caring for others  PARTICIPATION LIMITATIONS: cleaning, laundry, interpersonal relationship, shopping, community activity, and occupation  PERSONAL FACTORS: Time since onset of injury/illness/exacerbation, Transportation, and 3+ comorbidities: Fibromyalgia, knee surgeries, anxiety are also affecting patient's functional outcome.   REHAB POTENTIAL: Excellent  CLINICAL DECISION MAKING: Evolving/moderate complexity  EVALUATION COMPLEXITY: Moderate   GOALS: Goals reviewed with patient? Yes  SHORT TERM GOALS: Target date: 05/21/2024   Patient will be able to show independence for initial HEP to include posture, core and hip strength and stability.  Baseline: Goal status: MET   2.  Patient will be able to complete 2 min walk test distance for baseline pain and endurance assessment and  goal set  Baseline: 415  Goal status: MET   3.  Pt will complete balance assessment and set goal  Baseline:  Goal status: INITIAL   LONG TERM GOALS: Target date: 06/25/2024    Patient will be able to improve 2 min walk test distance by 50 feet or more with LRAD and no increase in pain.  Baseline:  Goal status: INITIAL  2.  Patient will be able to demonstrate hip/knee/ankle strength to 5/5 in order to maximize functional mobility, ambulation and lifting.   Baseline:  Goal status: INITIAL  3.  Patient will be independent with final HEP upon discharge from PT and report consistent benefit following exercise completion.   Baseline:  Goal status: INITIAL  4.  Patient will be able to demonstrate right shoulder strength to 4+/5 in with min pain for optimal use of her dominant arm with ADLs and IADLs.  Baseline: 3+/5 Goal status: NEW   5.  Patient will be able to ambulate in the community > 1000 feet with mod I using least restrictive assistive device and min to no discernable limp, pain managed to 4/10.  Baseline:  Goal status: INITIAL  6.  Pt will demo L hip abduction to > 4/5 or more in order to support her with ambulation, walking  Baseline:  Goal status: INITIAL   7. Pt will improve her DGI score to 24 out of 26 indicating greater gait stability Baseline: 21/26 Goal status: NEW PLAN:  PT FREQUENCY: 2x/week  PT DURATION: 8 weeks  PLANNED INTERVENTIONS: 97164- PT Re-evaluation, 97750- Physical Performance Testing, 97110-Therapeutic exercises, 97530- Therapeutic activity, [id number]- Neuromuscular re-education, 97535- Self Care, 82956- Manual therapy, [phone]- Gait training, Patient/Family education, Balance training, Stair training, Taping, Dry Needling, Cryotherapy, and Moist heat.  PLAN FOR NEXT SESSION: add shoulder to standing, wall ex as able . sit to stand with band (in HEP now), Hip abd, glute strength as tol.  Ice post. Check HEP, consider DN for L ant hip vs post hip, core  stability, NuStep, (TUG?)

## 2024-06-04 ENCOUNTER — Ambulatory Visit (INDEPENDENT_AMBULATORY_CARE_PROVIDER_SITE_OTHER): Admitting: Obstetrics and Gynecology

## 2024-06-04 ENCOUNTER — Encounter: Payer: Self-pay | Admitting: Obstetrics and Gynecology

## 2024-06-04 VITALS — BP 116/74 | HR 71

## 2024-06-04 DIAGNOSIS — L731 Pseudofolliculitis barbae: Secondary | ICD-10-CM

## 2024-06-04 DIAGNOSIS — R238 Other skin changes: Secondary | ICD-10-CM

## 2024-06-04 DIAGNOSIS — N812 Incomplete uterovaginal prolapse: Secondary | ICD-10-CM

## 2024-06-04 DIAGNOSIS — N8110 Cystocele, unspecified: Secondary | ICD-10-CM

## 2024-06-04 DIAGNOSIS — N816 Rectocele: Secondary | ICD-10-CM

## 2024-06-04 NOTE — Progress Notes (Signed)
 [facility]   Subjective:     Chief Complaint: No chief complaint on file.  History of Present Illness: Betty Holmes is a 67 y.o. female with stage II pelvic organ prolapse who presents for a pessary check. She is using a size #5 cube pessary. The pessary has been working well and she has no complaints. She is using vaginal estrogen. She denies vaginal bleeding.  Past Medical History: Patient  has a past medical history of Anxiety, Arthritis, At high risk for tick borne illness, Depression, Fibromyalgia, and Fibromyalgia.   Past Surgical History: She  has a past surgical history that includes Cesarean section; Knee surgery; Ovarian cyst removal; Knee arthroscopy; Cyst excision (Right, 05/09/2017); Joint replacement; and cataracts removal (bilateral) (Bilateral).   Medications: She has a current medication list which includes the following prescription(s): acetaminophen , clobetasol  propionate e, estradiol , hydroxyzine , and meloxicam .   Allergies: Patient is allergic to penicillins and mineral oil.   Social History: Patient  reports that she has never smoked. She has never used smokeless tobacco. She reports that she does not drink alcohol and does not use drugs.      Objective:    Physical Exam: BP 116/74   Pulse 71  Gen: No apparent distress, A&O x 3. Detailed Urogynecologic Evaluation:  Pelvic Exam: Normal external female genitalia; Bartholin's and Skene's glands normal in appearance; urethral meatus normal in appearance, no urethral masses or discharge. The pessary was noted to be in place. It was removed and cleaned. Speculum exam revealed no lesions in the vagina. The pessary was replaced. It was comfortable to the patient and fit well.   Patient had a small ingrown hair follicle on the left lower labia that she asked me to pull the hair. Hair removed and a small amount of puss was pulled from the region. No bleeding noted.   Assessment/Plan:     Assessment: Betty Holmes is a 68 y.o. with stage II pelvic organ prolapse here for a pessary check. She is doing well.  Plan: She will remove nightly. She will continue to use estrogen. She will follow-up in 6 months for a pessary check or sooner as needed.  All questions were answered.

## 2024-06-10 ENCOUNTER — Ambulatory Visit: Admitting: Physical Therapy

## 2024-06-10 ENCOUNTER — Encounter: Payer: Self-pay | Admitting: Physical Therapy

## 2024-06-10 DIAGNOSIS — M25552 Pain in left hip: Secondary | ICD-10-CM | POA: Diagnosis not present

## 2024-06-10 DIAGNOSIS — R278 Other lack of coordination: Secondary | ICD-10-CM | POA: Diagnosis not present

## 2024-06-10 DIAGNOSIS — M6281 Muscle weakness (generalized): Secondary | ICD-10-CM | POA: Diagnosis not present

## 2024-06-10 DIAGNOSIS — M353 Polymyalgia rheumatica: Secondary | ICD-10-CM | POA: Diagnosis not present

## 2024-06-10 DIAGNOSIS — M5459 Other low back pain: Secondary | ICD-10-CM | POA: Diagnosis not present

## 2024-06-10 DIAGNOSIS — R262 Difficulty in walking, not elsewhere classified: Secondary | ICD-10-CM | POA: Diagnosis not present

## 2024-06-10 DIAGNOSIS — M25511 Pain in right shoulder: Secondary | ICD-10-CM | POA: Diagnosis not present

## 2024-06-10 DIAGNOSIS — G8929 Other chronic pain: Secondary | ICD-10-CM | POA: Diagnosis not present

## 2024-06-10 NOTE — Therapy (Signed)
 OUTPATIENT PHYSICAL THERAPY NOTE  Patient Name: Betty Holmes MRN: [id number]  DOB:[birth year], 68 y.o., female Today's Date: 06/10/2024  END OF SESSION:  PT End of Session - 06/10/24 0938     Visit Number 9    Number of Visits 16    Date for PT Re-Evaluation 06/25/24    Authorization Type [employer] Dual complete, MCD    PT Start Time 0935    PT Stop Time 1015    PT Time Calculation (min) 40 min            Past Medical History:  Diagnosis Date   Anxiety    Arthritis    At high risk for tick borne illness    had abx--test her again--she was okey---at [facility]   Depression    Fibromyalgia    Fibromyalgia    Past Surgical History:  Procedure Laterality Date   cataracts removal (bilateral) Bilateral    CESAREAN SECTION     CYST EXCISION Right 05/09/2017   Procedure: EXCISION RIGHT INGUINAL CYST;  Surgeon: Adalberto Acton, MD;  Location: [facility];  Service: General;  Laterality: Right;   JOINT REPLACEMENT     KNEE ARTHROSCOPY     KNEE SURGERY     OVARIAN CYST REMOVAL     Patient Active Problem List   Diagnosis Date Noted   Diverticulosis 04/13/2024   Urinary incontinence, mixed 07/15/2023   Constipation, chronic 10/14/2022   Obesity 10/30/2021   S/P cataract extraction 10/30/2021   Candidal intertrigo 08/22/2021   GAD (generalized anxiety disorder) 09/21/2019   Pain in left wrist 07/24/2017   Painful wrist, right 07/24/2017   Osteopenia 04/21/2017   Cervical stenosis of spine 04/21/2017   Vitamin D  deficiency 04/21/2017   Recurrent major depressive disorder, in partial remission (HCC) 04/09/2017   Chronic bilateral low back pain 04/09/2017   Fibromyalgia     PCP: Kandace Organ NP   REFERRING PROVIDER: Kandace Organ NP Rennis Case PA ([place])   REFERRING DIAG:  Diagnosis  M54.50,G89.29 (ICD-10-CM) - Chronic bilateral low back pain without sciatica  M48.02 (ICD-10-CM) - Cervical stenosis of spine  M16.12  (ICD-10-CM) - Unilateral primary osteoarthritis, left hip  Rationale for Evaluation and Treatment: Rehabilitation  THERAPY DIAG:  Pain in left hip  Other low back pain  ONSET DATE: back yrs, L hip March?   SUBJECTIVE:                                                                                                                                                                                           SUBJECTIVE STATEMENT: 06/03/24 Pain is  minimal in hip.  The exercises are helping me. Right shoulder still is killing me.   PERTINENT HISTORY:  Chronic joint pain and stiffness, worse with activity, no joint swelling or erythema Unable to tolerate the following med in the past: Cymbalta caused increased anxiety and insomnia, Lyrica  caused withdrawal symptoms when dose was missed, and Gabapentin caused headache  PAIN:  Are you having pain? Yes: NPRS scale: min  Pain location: low back and L hip (anterior/groin)  Pain description: stiff, sharp in her L hip  Aggravating factors: activity, standing on LLE, sitting too long, walking gets a bit better then it gets worse (30 min)    Relieving factors: Ibu, BC powder, heating pad   Shoulder pain Rt anterior/superior  2/10 with movement Aggs:Cooking, lifting, reaching Alleviate: rest, meds, ice   PRECAUTIONS: None  RED FLAGS: None   WEIGHT BEARING RESTRICTIONS: No  FALLS:  Has patient fallen in last 6 months? No She does reports decreased confidence with taking the bus, stepping off curb.   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   NEXT MD VISIT: Sees [facility] for L hip in about 2 weeks   OBJECTIVE:  Note: Objective measures were completed at  Evaluation unless otherwise noted.  DIAGNOSTIC FINDINGS:  None recent   PATIENT SURVEYS:  Modified Oswestry 19/50     Needs LEFS?   COGNITION: Overall cognitive status: Within functional limits for tasks assessed     SENSATION: WFL  MUSCLE LENGTH: Hamstrings: good Thomas test: not measured but L sided stretch addressed painful area   POSTURE: rounded shoulders, forward head, and increased thoracic kyphosis  PALPATION: TTP on Rt lateral hip Pain with palpation to left posterior hip, left rectus femoris and left iliopsoas    UPPER EXTREMITY ROM:   Active ROM Right 06/10/2024 Left 06/10/2024  Shoulder flexion 132 150+  Shoulder extension    Shoulder abduction 95 full  Shoulder adduction    Shoulder internal rotation Pain: FR to mid back    Shoulder external rotation Min pain, 90+   Elbow flexion    Elbow extension    Wrist flexion    Wrist extension    Wrist ulnar deviation    Wrist radial deviation    Wrist pronation    Wrist supination    (Blank rows = not tested)  UPPER EXTREMITY MMT:  MMT Right 06/10/2024 Left 06/10/2024  Shoulder flexion 3+/5 4/5  Shoulder extension    Shoulder abduction 3+/5 4/5  Shoulder adduction    Shoulder internal rotation 4- pain    Shoulder external rotation 4- pain    Grip strength (lbs)    (Blank rows = not tested)  SHOULDER SPECIAL TESTS:  Impingement tests: Neer impingement test: positive   PALPATION:  Gross pain Rt shoulder , pressure to humeral head felt good   LUMBAR ROM:   AROM eval  Flexion WFL  Extension 75% limited   Right lateral flexion WFL  Left lateral flexion WFL  Right rotation WFL  Left rotation WFL   (Blank rows = not tested)  LOWER EXTREMITY ROM:   WNL - pt with borderline hypermobility in her hip IR  Passive  Right eval Left 05/13/24 eval  Hip flexion  Full ,  pain ant hip  Hip extension    Hip abduction    Hip adduction    Hip internal rotation  45-50 deg  Hip external rotation  Pain  60 deg   Knee flexion  WNL  Knee extension  WNL   Ankle dorsiflexion    Ankle plantarflexion    Ankle inversion    Ankle eversion     (Blank rows = not tested)  LOWER EXTREMITY MMT:    MMT Right eval Left eval Rt 05/13/24 Lt.  05/13/24  Hip flexion 4 4-    Hip extension   3-/5 3-/5  Hip abduction   3-/5 2+/5  Hip adduction      Hip internal rotation       Hip external rotation      Knee flexion 5 5 5 5   Knee extension 5 5 5 5   Ankle dorsiflexion      Ankle plantarflexion      Ankle inversion      Ankle eversion       (Blank rows = not tested)  LUMBAR SPECIAL TESTS:  Straight leg raise test: Negative  Hip testing: Negative left scour test, positive FABER test for L anterior hip    FUNCTIONAL TESTS:  5 times sit to stand: 20 seconds no UE   GAIT: Distance walked: 100 Assistive device utilized: None Level of assistance: Modified independence Comments: Slow pace slight trunk flexion and antalgic patterning  TREATMENT DATE:  [facility] Adult PT Treatment:                                                DATE: 06/10/24 Therapeutic Exercise: Narrow pullovers with iso hold - yellow Supine YTB x 10  Supine long lever flexion - increased pain Supine short lever- increased pain  SL shoulder abduction x flexion x 8 each  SL er AROM Standing cane AAROM flexion and abduction  STS x 10 STS with LLE back x 8 , RLE back x 8 6 inch step up Left increased pain, 4 inch also increased pain Shoulder iso flexi, abdct, ER      [facility] Adult PT Treatment:                                                DATE: 06/03/24 Therapeutic Exercise: Supine Red vs YTB bridge with horiz abdct pull Narrow pullovers with iso hold - red band vs yellow Supine red vs yellow bilat ER ISOMETRICS right shoulder 5 ways- give 3 way for HEP Standing cane shoulder flexion and abdct x 10 each +HEP STS x 10 4 inch step ups x 10 each - no pain 6 inch step up -increased pain left so discontinued Updated  HEP     [facility] Adult PT Treatment:                                                DATE: 05/26/24 Therapeutic Exercise: NuStep Level 6 UE and LE for 8 min  Therapeutic Activity: Pilates Reformer used for LE/core strength, postural strength, lumbopelvic disassociation and core control.  Exercises included: Footwork 2  red 1 blue:  double leg Parallel and turnout , toe and heels on the bar.  Min cues for alignment.  Single leg press out 2 red 1 blue  Bridging all springs with ball x 15  Supine Arm/Abs: 1 red 1 yellow Arcs with ball , parallel and V x 10 each cues for breathing Feet in Straps 1 red 1 yellow arcs and circles and frog squats    [facility] Adult PT Treatment:                                                DATE: 05/24/24  Therapeutic Activity: PT assessed Rt shoulder ROM, MMT and palpation Core bracing Bracing Red looped band ER isometric x 10, 5 sec hold Chest press with band  OH lift red band Corner Stretch x 3     05/24/24 0001  Dynamic Gait Index  Level Surface 2 (toes turns in, narrow)  Change in Gait Speed 3  Gait with Horizontal Head Turns 3  Gait with Vertical Head Turns 3  Gait and Pivot Turn 3  Step Over Obstacle 2  Step Around Obstacles 3  Steps 2  Total Score 21  DGI comment: toes in Lt.> Rt.      [facility] Adult PT Treatment:                                                DATE: 05/21/24 Therapeutic Exercise: Nustep L5 UE/LE x 6 minutes  Bridge x 10 Iso adduction x 10 Bridge with ball squeeze x 10 Iso clam into belt x 10  Iso clam with bridge x 10 Standing hip abduction x 10 each Standing isometric hip flexion into towel/ball - standing at counter Updated HEP    [facility] Adult PT Treatment:                                                DATE: 05/19/24 Therapeutic Exercise: Nustep L6 UE/LE x 5 minutes Bridge 5 sec 10 x 2  Iso clam using belt 5 sec x 10 Iso supine bilat abdct with belt at thighs -legs over 2 pillows for slight knee flexion Iso ball squeeze  5 sec x 10  Updated HEP     [facility] Adult PT Treatment:                                                DATE: 05/13/24 Therapeutic Exercise/Activity: NuStep L6 UE and LE  Core bracing with ball squeeze Isometric blue band abd/ER with ab bracing Mini bridge x 10 (band and ball)  SLR x 10 each LE, more difficulty on LLE, easy on Rt , VMO SLR on LLE  Hip abduction LLE , MMT PROM and hip testing  Modalities: Cold pack in sidelying L hip 8 min during self care  Self Care: Use pillows and ice pack vs using Ibuprofen  MMT hips and implication of weakness in hips    PATIENT EDUCATION:  Education  details: see above  Person educated: Patient Education method: Explanation, Demonstration, Verbal cues, and Handouts Education comprehension: verbalized understanding and returned demonstration  HOME EXERCISE PROGRAM: Access Code: [email or web address] URL: [email or web address] Date: 04/30/2024 Prepared by: Marci Setter  Access Code: [email or web address] URL: [email or web address] Date: 05/24/2024 Prepared by: Marci Setter  Exercises - Modified Thomas Stretch  - 1 x daily - 7 x weekly - 1 sets - 3 reps - 1-3 hold - Supine Bridge  - 1 x daily - 7 x weekly - 2 sets - 10 reps - 5 hold - Sidelying Hip Abduction  - 1 x daily - 7 x weekly - 2 sets - 10 reps - 2 hold - Standing Hip Abduction with Counter Support  - 1 x daily - 7 x weekly - 2 sets - 10 reps - 2 hold - Clamshell with Resistance  - 1 x daily - 7 x weekly - 2 sets - 10 reps - Sit to Stand with Resistance Around Legs  - 1 x daily - 7 x weekly - 2 sets - 10 reps - Bridge with Hip Abduction and Resistance  - 1 x daily - 7 x weekly - 2 sets - 10 reps - Hooklying Bilateral Isometric Clamshell  - 1 x daily - 7 x weekly - 2 sets - 10 reps - Supine Hip Abduction  - 1 x daily - 7 x weekly - 2 sets - 10 reps - Supine Hip Adduction Isometric with Ball  - 1 x daily - 7 x weekly - 2 sets - 10 reps - 5 hold - Standing Isometric Hip Flexion  with Ball at [employer]  - 1 x daily - 7 x weekly - 1 sets - 10 reps - 5 hold - Shoulder External Rotation and Scapular Retraction with Resistance  - 1 x daily - 7 x weekly - 2 sets - 10 reps - 5 hold - Shoulder Flexion Serratus Activation with Resistance  - 1 x daily - 7 x weekly - 2 sets - 10 reps - 5 hold   ASSESSMENT:  CLINICAL IMPRESSION: Right shoulder still very painful with sleep and repetitive movements. Continued isometrics and AAROM with good tolerance. Side lying shoulder AROM causes 4/10 pain except ER which is painfree on her side.  She feels hip is improving overall. Still unable to tolerate step up without pain. No changes to HEP. She did like the reformer at prior session.     Patient is a 68 y.o. female who was seen today for physical therapy evaluation and treatment for lower back pain, neck pain.  She is also having a significant amount of pain in her left hip which does seem related to her left iliopsoas.  She presents with signs and symptoms consistent with a tendinopathy in her gluteus medius and may have some anterior hip involvement as well.  While she was traveling walking extensively and climbing stairs she aggravated it and it continues to be the most bothersome symptom that she has.  She will be getting an x-ray in the coming week and more of an orthopedic workup.  OBJECTIVE IMPAIRMENTS: Abnormal gait, decreased activity tolerance, decreased balance, decreased mobility, difficulty walking, decreased strength, increased fascial restrictions, improper body mechanics, obesity, and pain.   ACTIVITY LIMITATIONS: carrying, lifting, bending, sitting, standing, squatting, sleeping, stairs, transfers, bed mobility, locomotion level, and caring for others  PARTICIPATION LIMITATIONS: cleaning, laundry, interpersonal relationship, shopping, community activity, and occupation  PERSONAL FACTORS: Time since onset of injury/illness/exacerbation, Transportation, and 3+ comorbidities:  Fibromyalgia,  knee surgeries, anxiety are also affecting patient's functional outcome.   REHAB POTENTIAL: Excellent  CLINICAL DECISION MAKING: Evolving/moderate complexity  EVALUATION COMPLEXITY: Moderate   GOALS: Goals reviewed with patient? Yes  SHORT TERM GOALS: Target date: 05/21/2024   Patient will be able to show independence for initial HEP to include posture, core and hip strength and stability.  Baseline: Goal status: MET   2.  Patient will be able to complete 2 min walk test distance for baseline pain and endurance assessment and goal set  Baseline: 415  Goal status: MET   3.  Pt will complete balance assessment and set goal  Baseline:  Goal status: INITIAL   LONG TERM GOALS: Target date: 06/25/2024    Patient will be able to improve 2 min walk test distance by 50 feet or more with LRAD and no increase in pain.  Baseline:  Goal status: INITIAL  2.  Patient will be able to demonstrate hip/knee/ankle strength to 5/5 in order to maximize functional mobility, ambulation and lifting.   Baseline:  Goal status: INITIAL  3.  Patient will be independent with final HEP upon discharge from PT and report consistent benefit following exercise completion.   Baseline:  Goal status: INITIAL  4.  Patient will be able to demonstrate right shoulder strength to 4+/5 in with min pain for optimal use of her dominant arm with ADLs and IADLs.  Baseline: 3+/5 Goal status: NEW   5.  Patient will be able to ambulate in the community > 1000 feet with mod I using least restrictive assistive device and min to no discernable limp, pain managed to 4/10.  Baseline:  Goal status: INITIAL  6.  Pt will demo L hip abduction to > 4/5 or more in order to support her with ambulation, walking  Baseline:  Goal status: INITIAL   7. Pt will improve her DGI score to 24 out of 26 indicating greater gait stability Baseline: 21/26 Goal status: NEW PLAN:  PT FREQUENCY: 2x/week  PT DURATION: 8  weeks  PLANNED INTERVENTIONS: 97164- PT Re-evaluation, 97750- Physical Performance Testing, 97110-Therapeutic exercises, 97530- Therapeutic activity, [id number]- Neuromuscular re-education, 97535- Self Care, [id number]- Manual therapy, [phone]- Gait training, Patient/Family education, Balance training, Stair training, Taping, Dry Needling, Cryotherapy, and Moist heat.  PLAN FOR NEXT SESSION: add shoulder to standing, wall ex as able . sit to stand with band (in HEP now), Hip abd, glute strength as tol.  Ice post. Check HEP, consider DN for L ant hip vs post hip, core stability, NuStep, (TUG?)

## 2024-06-11 ENCOUNTER — Ambulatory Visit: Admitting: Physical Therapy

## 2024-06-11 DIAGNOSIS — R262 Difficulty in walking, not elsewhere classified: Secondary | ICD-10-CM

## 2024-06-11 DIAGNOSIS — G8929 Other chronic pain: Secondary | ICD-10-CM | POA: Diagnosis not present

## 2024-06-11 DIAGNOSIS — M353 Polymyalgia rheumatica: Secondary | ICD-10-CM | POA: Diagnosis not present

## 2024-06-11 DIAGNOSIS — R278 Other lack of coordination: Secondary | ICD-10-CM | POA: Diagnosis not present

## 2024-06-11 DIAGNOSIS — M25511 Pain in right shoulder: Secondary | ICD-10-CM | POA: Diagnosis not present

## 2024-06-11 DIAGNOSIS — M25552 Pain in left hip: Secondary | ICD-10-CM

## 2024-06-11 DIAGNOSIS — M5459 Other low back pain: Secondary | ICD-10-CM

## 2024-06-11 DIAGNOSIS — M6281 Muscle weakness (generalized): Secondary | ICD-10-CM | POA: Diagnosis not present

## 2024-06-11 NOTE — Therapy (Signed)
 OUTPATIENT PHYSICAL THERAPY NOTE  Patient Name: Betty Holmes MRN: [id number]  DOB:14-May-1956, 68 y.o., female Today's Date: 06/11/2024  END OF SESSION:  PT End of Session - 06/11/24 0938     Visit Number 10    Number of Visits 16    Date for PT Re-Evaluation 06/25/24    Authorization Type UHC Dual complete, MCD    PT Start Time 0935    PT Stop Time 1015    PT Time Calculation (min) 40 min    Activity Tolerance Patient tolerated treatment well    Behavior During Therapy WFL for tasks assessed/performed            Past Medical History:  Diagnosis Date   Anxiety    Arthritis    At high risk for tick borne illness    had abx--test her again--she was okey---at [facility]   Depression    Fibromyalgia    Fibromyalgia    Past Surgical History:  Procedure Laterality Date   cataracts removal (bilateral) Bilateral    CESAREAN SECTION     CYST EXCISION Right 05/09/2017   Procedure: EXCISION RIGHT INGUINAL CYST;  Surgeon: Adalberto Acton, MD;  Location: [facility];  Service: General;  Laterality: Right;   JOINT REPLACEMENT     KNEE ARTHROSCOPY     KNEE SURGERY     OVARIAN CYST REMOVAL     Patient Active Problem List   Diagnosis Date Noted   Diverticulosis 04/13/2024   Urinary incontinence, mixed 07/15/2023   Constipation, chronic 10/14/2022   Obesity 10/30/2021   S/P cataract extraction 10/30/2021   Candidal intertrigo 08/22/2021   GAD (generalized anxiety disorder) 09/21/2019   Pain in left wrist 07/24/2017   Painful wrist, right 07/24/2017   Osteopenia 04/21/2017   Cervical stenosis of spine 04/21/2017   Vitamin D  deficiency 04/21/2017   Recurrent major depressive disorder, in partial remission (HCC) 04/09/2017   Chronic bilateral low back pain 04/09/2017   Fibromyalgia     PCP: Kandace Organ NP   REFERRING PROVIDER: Kandace Organ NP Rennis Case PA ([place])   REFERRING DIAG:  Diagnosis  M54.50,G89.29  (ICD-10-CM) - Chronic bilateral low back pain without sciatica  M48.02 (ICD-10-CM) - Cervical stenosis of spine  M16.12 (ICD-10-CM) - Unilateral primary osteoarthritis, left hip  Rationale for Evaluation and Treatment: Rehabilitation  THERAPY DIAG:  Pain in left hip  Other low back pain  Polymyalgia (HCC)  Difficulty in walking, not elsewhere classified  Muscle weakness (generalized)  Chronic right shoulder pain  Other lack of coordination  ONSET DATE: back yrs, L hip March?   SUBJECTIVE:  SUBJECTIVE STATEMENT: Shoulder is so much better I can feel myself getting stronger. Pt rushing in a bit. Did some group fitness classes and it felt great.  Mild hip pain L Going to [place] for 10 days.   PERTINENT HISTORY:  Chronic joint pain and stiffness, worse with activity, no joint swelling or erythema Unable to tolerate the following med in the past: Cymbalta caused increased anxiety and insomnia, Lyrica  caused withdrawal symptoms when dose was missed, and Gabapentin caused headache  PAIN:  Are you having pain? Yes: NPRS scale: min  Pain location: low back and L hip (anterior/groin)  Pain description: stiff, sharp in her L hip  Aggravating factors: activity, standing on LLE, sitting too long, walking gets a bit better then it gets worse (30 min)    Relieving factors: Ibu, BC powder, heating pad   Shoulder pain Rt anterior/superior  2/10 with movement Aggs:Cooking, lifting, reaching Alleviate: rest, meds, ice   PRECAUTIONS: None  RED FLAGS: None   WEIGHT BEARING RESTRICTIONS: No  FALLS:  Has patient fallen in last 6 months? No She does reports decreased confidence with taking the bus, stepping off curb.   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   NEXT MD VISIT: Sees [facility] for L hip in about 2 weeks   OBJECTIVE:  Note: Objective measures were completed at Evaluation unless otherwise noted.  DIAGNOSTIC FINDINGS:  None recent   PATIENT SURVEYS:  Modified Oswestry 19/50     Needs LEFS?   COGNITION: Overall cognitive status: Within functional limits for tasks assessed     SENSATION: WFL  MUSCLE LENGTH: Hamstrings: good Thomas test: not measured but L sided stretch addressed painful area   POSTURE: rounded shoulders, forward head, and increased thoracic kyphosis  PALPATION: TTP on Rt lateral hip Pain with palpation to left posterior hip, left rectus femoris and left iliopsoas    UPPER EXTREMITY ROM:   Active ROM Right 06/11/2024 Left 06/11/2024  Shoulder flexion 132 150+  Shoulder extension    Shoulder abduction 95 full  Shoulder adduction    Shoulder internal rotation Pain: FR to mid back    Shoulder external rotation Min pain, 90+   Elbow flexion    Elbow extension    Wrist flexion    Wrist extension    Wrist ulnar deviation    Wrist radial deviation    Wrist pronation    Wrist supination    (Blank rows = not tested)  UPPER EXTREMITY MMT:  MMT Right 06/11/2024 Left 06/11/2024  Shoulder flexion 3+/5 4/5  Shoulder extension    Shoulder abduction 3+/5 4/5  Shoulder adduction    Shoulder internal rotation 4- pain    Shoulder external rotation 4- pain    Grip strength (lbs)    (Blank rows = not tested)  SHOULDER SPECIAL TESTS:  Impingement tests: Neer impingement test: positive   PALPATION:  Gross pain Rt shoulder , pressure to humeral head felt good   LUMBAR ROM:   AROM eval  Flexion WFL  Extension 75% limited   Right lateral  flexion WFL  Left lateral flexion WFL  Right rotation WFL  Left rotation WFL   (Blank rows = not tested)  LOWER EXTREMITY ROM:  WNL - pt with borderline hypermobility in her hip IR  Passive  Right eval Left 05/13/24 eval  Hip flexion  Full , pain ant hip  Hip extension    Hip abduction    Hip adduction    Hip internal rotation  45-50 deg  Hip external rotation  Pain 60 deg   Knee flexion  WNL  Knee extension  WNL   Ankle dorsiflexion    Ankle plantarflexion    Ankle inversion    Ankle eversion     (Blank rows = not tested)  LOWER EXTREMITY MMT:    MMT Right eval Left eval Rt 05/13/24 Lt.  05/13/24  Hip flexion 4 4-    Hip extension   3-/5 3-/5  Hip abduction   3-/5 2+/5  Hip adduction      Hip internal rotation       Hip external rotation      Knee flexion 5 5 5 5   Knee extension 5 5 5 5   Ankle dorsiflexion      Ankle plantarflexion      Ankle inversion      Ankle eversion       (Blank rows = not tested)  LUMBAR SPECIAL TESTS:  Straight leg raise test: Negative  Hip testing: Negative left scour test, positive FABER test for L anterior hip    FUNCTIONAL TESTS:  5 times sit to stand: 20 seconds no UE   GAIT: Distance walked: 100 Assistive device utilized: None Level of assistance: Modified independence Comments: Slow pace slight trunk flexion and antalgic patterning  TREATMENT DATE:   [facility] Adult PT Treatment:                                                DATE: 06/11/24 Therapeutic Exercise: Knee extension 15 lbs x 15  Knee flexion 25 lbs x 15  Supine Pilates circle abd/ER Bridge abd/ER x 10  Adduction with Pilates circle Sit to stand with GTB on thighs  Squat GTB on thighs x 15  Hip abduction x 10, 2 sets used 3 lbs  Row 25 lbs neutral grip Lat pull down 25 lbs x 15   [facility] Adult PT Treatment:                                                DATE: 06/10/24 Therapeutic Exercise: Narrow pullovers with iso hold - yellow Supine YTB x 10  Supine  long lever flexion - increased pain Supine short lever- increased pain  SL shoulder abduction x flexion x 8 each  SL er AROM Standing cane AAROM flexion and abduction  STS x 10 STS with LLE back x 8 , RLE back x 8 6 inch step up Left increased pain, 4 inch also increased pain Shoulder iso flexi, abdct, ER      [facility] Adult PT Treatment:                                                DATE: 06/03/24 Therapeutic Exercise: Supine Red vs YTB bridge with horiz abdct pull Narrow pullovers with iso  hold - red band vs yellow Supine red vs yellow bilat ER ISOMETRICS right shoulder 5 ways- give 3 way for HEP Standing cane shoulder flexion and abdct x 10 each +HEP STS x 10 4 inch step ups x 10 each - no pain 6 inch step up -increased pain left so discontinued Updated HEP     [facility] Adult PT Treatment:                                                DATE: 05/26/24 Therapeutic Exercise: NuStep Level 6 UE and LE for 8 min  Therapeutic Activity: Pilates Reformer used for LE/core strength, postural strength, lumbopelvic disassociation and core control.  Exercises included: Footwork 2 red 1 blue:  double leg Parallel and turnout , toe and heels on the bar.  Min cues for alignment.  Single leg press out 2 red 1 blue  Bridging all springs with ball x 15  Supine Arm/Abs: 1 red 1 yellow Arcs with ball , parallel and V x 10 each cues for breathing Feet in Straps 1 red 1 yellow arcs and circles and frog squats    [facility] Adult PT Treatment:                                                DATE: 05/24/24  Therapeutic Activity: PT assessed Rt shoulder ROM, MMT and palpation Core bracing Bracing Red looped band ER isometric x 10, 5 sec hold Chest press with band  OH lift red band Corner Stretch x 3     05/24/24 0001  Dynamic Gait Index  Level Surface 2 (toes turns in, narrow)  Change in Gait Speed 3  Gait with Horizontal Head Turns 3  Gait with Vertical Head Turns 3  Gait and Pivot Turn 3  Step  Over Obstacle 2  Step Around Obstacles 3  Steps 2  Total Score 21  DGI comment: toes in Lt.> Rt.      [facility] Adult PT Treatment:                                                DATE: 05/21/24 Therapeutic Exercise: Nustep L5 UE/LE x 6 minutes  Bridge x 10 Iso adduction x 10 Bridge with ball squeeze x 10 Iso clam into belt x 10  Iso clam with bridge x 10 Standing hip abduction x 10 each Standing isometric hip flexion into towel/ball - standing at counter Updated HEP    [facility] Adult PT Treatment:                                                DATE: 05/19/24 Therapeutic Exercise: Nustep L6 UE/LE x 5 minutes Bridge 5 sec 10 x 2  Iso clam using belt 5 sec x 10 Iso supine bilat abdct with belt at thighs -legs over 2 pillows for slight knee flexion Iso ball squeeze 5 sec x 10  Updated HEP     [facility] Adult  PT Treatment:                                                DATE: 05/13/24 Therapeutic Exercise/Activity: NuStep L6 UE and LE  Core bracing with ball squeeze Isometric blue band abd/ER with ab bracing Mini bridge x 10 (band and ball)  SLR x 10 each LE, more difficulty on LLE, easy on Rt , VMO SLR on LLE  Hip abduction LLE , MMT PROM and hip testing  Modalities: Cold pack in sidelying L hip 8 min during self care  Self Care: Use pillows and ice pack vs using Ibuprofen  MMT hips and implication of weakness in hips    PATIENT EDUCATION:  Education details: see above  Person educated: Patient Education method: Explanation, Demonstration, Verbal cues, and Handouts Education comprehension: verbalized understanding and returned demonstration  HOME EXERCISE PROGRAM: Access Code: [email or web address] URL: [email or web address] Date: 04/30/2024 Prepared by: Marci Setter  Access Code: [email or web address] URL: [email or web address] Date: 05/24/2024 Prepared by: Marci Setter  Exercises - Modified Thomas Stretch  - 1 x daily - 7 x weekly - 1 sets - 3 reps - 1-3 hold -  Supine Bridge  - 1 x daily - 7 x weekly - 2 sets - 10 reps - 5 hold - Sidelying Hip Abduction  - 1 x daily - 7 x weekly - 2 sets - 10 reps - 2 hold - Standing Hip Abduction with Counter Support  - 1 x daily - 7 x weekly - 2 sets - 10 reps - 2 hold - Clamshell with Resistance  - 1 x daily - 7 x weekly - 2 sets - 10 reps - Sit to Stand with Resistance Around Legs  - 1 x daily - 7 x weekly - 2 sets - 10 reps - Bridge with Hip Abduction and Resistance  - 1 x daily - 7 x weekly - 2 sets - 10 reps - Hooklying Bilateral Isometric Clamshell  - 1 x daily - 7 x weekly - 2 sets - 10 reps - Supine Hip Abduction  - 1 x daily - 7 x weekly - 2 sets - 10 reps - Supine Hip Adduction Isometric with Ball  - 1 x daily - 7 x weekly - 2 sets - 10 reps - 5 hold - Standing Isometric Hip Flexion with Ball at [employer]  - 1 x daily - 7 x weekly - 1 sets - 10 reps - 5 hold - Shoulder External Rotation and Scapular Retraction with Resistance  - 1 x daily - 7 x weekly - 2 sets - 10 reps - 5 hold - Shoulder Flexion Serratus Activation with Resistance  - 1 x daily - 7 x weekly - 2 sets - 10 reps - 5 hold   ASSESSMENT:  CLINICAL IMPRESSION: Patient is making good functional progress with her upper and lower body.   She is back to taking classes at the [facility] with some increase in symptoms (hip pain ).  She is definitely doing more and maintaining her same level of pain. She will taking [place] but would like to be renewed for more visits to continue her functional mobility training for UE and LE.    Patient is a 68 y.o. female who was seen today for physical therapy evaluation and treatment for lower back pain, neck pain.  She is also having  a significant amount of pain in her left hip which does seem related to her left iliopsoas.  She presents with signs and symptoms consistent with a tendinopathy in her gluteus medius and may have some anterior hip involvement as well.  While she was traveling walking extensively and climbing  stairs she aggravated it and it continues to be the most bothersome symptom that she has.  She will be getting an x-ray in the coming week and more of an orthopedic workup.  OBJECTIVE IMPAIRMENTS: Abnormal gait, decreased activity tolerance, decreased balance, decreased mobility, difficulty walking, decreased strength, increased fascial restrictions, improper body mechanics, obesity, and pain.   ACTIVITY LIMITATIONS: carrying, lifting, bending, sitting, standing, squatting, sleeping, stairs, transfers, bed mobility, locomotion level, and caring for others  PARTICIPATION LIMITATIONS: cleaning, laundry, interpersonal relationship, shopping, community activity, and occupation  PERSONAL FACTORS: Time since onset of injury/illness/exacerbation, Transportation, and 3+ comorbidities: Fibromyalgia, knee surgeries, anxiety are also affecting patient's functional outcome.   REHAB POTENTIAL: Excellent  CLINICAL DECISION MAKING: Evolving/moderate complexity  EVALUATION COMPLEXITY: Moderate   GOALS: Goals reviewed with patient? Yes  SHORT TERM GOALS: Target date: 05/21/2024   Patient will be able to show independence for initial HEP to include posture, core and hip strength and stability.  Baseline: Goal status: MET   2.  Patient will be able to complete 2 min walk test distance for baseline pain and endurance assessment and goal set  Baseline: 415  Goal status: MET   3.  Pt will complete balance assessment and set goal  Baseline:  Goal status: INITIAL   LONG TERM GOALS: Target date: 06/25/2024    Patient will be able to improve 2 min walk test distance by 50 feet or more with LRAD and no increase in pain.  Baseline:  Goal status: INITIAL  2.  Patient will be able to demonstrate hip/knee/ankle strength to 5/5 in order to maximize functional mobility, ambulation and lifting.   Baseline:  Goal status: INITIAL  3.  Patient will be independent with final HEP upon discharge from PT and  report consistent benefit following exercise completion.   Baseline:  Goal status: INITIAL  4.  Patient will be able to demonstrate right shoulder strength to 4+/5 in with min pain for optimal use of her dominant arm with ADLs and IADLs.  Baseline: 3+/5 Goal status: NEW   5.  Patient will be able to ambulate in the community > 1000 feet with mod I using least restrictive assistive device and min to no discernable limp, pain managed to 4/10.  Baseline:  Goal status: INITIAL  6.  Pt will demo L hip abduction to > 4/5 or more in order to support her with ambulation, walking  Baseline:  Goal status: INITIAL   7. Pt will improve her DGI score to 24 out of 26 indicating greater gait stability Baseline: 21/26 Goal status: NEW PLAN:  PT FREQUENCY: 2x/week  PT DURATION: 8 weeks  PLANNED INTERVENTIONS: 97164- PT Re-evaluation, 97750- Physical Performance Testing, 97110-Therapeutic exercises, 97530- Therapeutic activity, [id number]- Neuromuscular re-education, 97535- Self Care, 81191- Manual therapy, [phone]- Gait training, Patient/Family education, Balance training, Stair training, Taping, Dry Needling, Cryotherapy, and Moist heat.  PLAN FOR NEXT SESSION: GOALS . add shoulder to standing, wall ex as able . sit to stand with band (in HEP now), Hip abd, glute strength as tol.  Ice post. Check HEP, consider DN for L ant hip vs post hip, core stability, NuStep, (TUG?)   Marci Setter, PT 06/11/24 1:02 PM Phone:  [phone] Fax: [phone]

## 2024-06-14 ENCOUNTER — Encounter: Admitting: Physical Therapy

## 2024-06-21 ENCOUNTER — Encounter: Admitting: Physical Therapy

## 2024-06-24 ENCOUNTER — Encounter: Admitting: Physical Therapy

## 2024-06-30 ENCOUNTER — Ambulatory Visit: Attending: Nurse Practitioner | Admitting: Physical Therapy

## 2024-06-30 ENCOUNTER — Encounter: Payer: Self-pay | Admitting: Physical Therapy

## 2024-06-30 DIAGNOSIS — M25552 Pain in left hip: Secondary | ICD-10-CM | POA: Insufficient documentation

## 2024-06-30 DIAGNOSIS — M5459 Other low back pain: Secondary | ICD-10-CM | POA: Insufficient documentation

## 2024-06-30 DIAGNOSIS — R262 Difficulty in walking, not elsewhere classified: Secondary | ICD-10-CM | POA: Insufficient documentation

## 2024-06-30 DIAGNOSIS — M353 Polymyalgia rheumatica: Secondary | ICD-10-CM | POA: Diagnosis not present

## 2024-06-30 DIAGNOSIS — M6281 Muscle weakness (generalized): Secondary | ICD-10-CM | POA: Diagnosis not present

## 2024-06-30 DIAGNOSIS — M25511 Pain in right shoulder: Secondary | ICD-10-CM | POA: Diagnosis not present

## 2024-06-30 DIAGNOSIS — G8929 Other chronic pain: Secondary | ICD-10-CM | POA: Diagnosis not present

## 2024-06-30 NOTE — Therapy (Signed)
 OUTPATIENT PHYSICAL THERAPY NOTE  Patient Name: Betty Holmes MRN: [id number]  DOB:Feb 08, 1956, 68 y.o., female Today's Date: 06/30/2024   PHYSICAL THERAPY DISCHARGE SUMMARY  Visits from Start of Care: 11  Current functional level related to goals / functional outcomes: Goal were on track to be met but she had a setback with time out of state.    Remaining deficits: L hip pain, weakness    Education / Equipment: HEP, core , posture    Patient agrees to discharge. Patient goals were partially met. Patient is being discharged due to being pleased with the current functional level.    END OF SESSION:  PT End of Session - 06/30/24 0852     Visit Number 11    Number of Visits 16    Date for PT Re-Evaluation 06/30/24    Authorization Type [employer] Dual complete, MCD    PT Start Time 0850    PT Stop Time 0930    PT Time Calculation (min) 40 min    Activity Tolerance Patient tolerated treatment well    Behavior During Therapy WFL for tasks assessed/performed             Past Medical History:  Diagnosis Date   Anxiety    Arthritis    At high risk for tick borne illness    had abx--test her again--she was okey---[facility]   Depression    Fibromyalgia    Fibromyalgia    Past Surgical History:  Procedure Laterality Date   cataracts removal (bilateral) Bilateral    CESAREAN SECTION     CYST EXCISION Right 05/09/2017   Procedure: EXCISION RIGHT INGUINAL CYST;  Surgeon: Signe Mitzie LABOR, MD;  Location:  [facility];  Service: General;  Laterality: Right;   JOINT REPLACEMENT     KNEE ARTHROSCOPY     KNEE SURGERY     OVARIAN CYST REMOVAL     Patient Active Problem List   Diagnosis Date Noted   Diverticulosis 04/13/2024   Urinary incontinence, mixed 07/15/2023   Constipation, chronic 10/14/2022   Obesity 10/30/2021   S/P cataract extraction 10/30/2021   Candidal intertrigo 08/22/2021   GAD (generalized anxiety disorder) 09/21/2019    Pain in left wrist 07/24/2017   Painful wrist, right 07/24/2017   Osteopenia 04/21/2017   Cervical stenosis of spine 04/21/2017   Vitamin D  deficiency 04/21/2017   Recurrent major depressive disorder, in partial remission (HCC) 04/09/2017   Chronic bilateral low back pain 04/09/2017   Fibromyalgia     PCP: Katheen Roselie Rockford NP   REFERRING PROVIDER: Katheen Roselie Rockford NP Gabino Moores PA ([place])   REFERRING DIAG:  Diagnosis  M54.50,G89.29 (ICD-10-CM) - Chronic bilateral low back pain without sciatica  M48.02 (ICD-10-CM) - Cervical stenosis of spine  M16.12 (ICD-10-CM) - Unilateral primary osteoarthritis, left hip  Rationale for Evaluation and Treatment: Rehabilitation  THERAPY DIAG:  Pain in left hip  Other low back pain  Polymyalgia (HCC)  Difficulty in walking, not elsewhere classified  Muscle weakness (generalized)  Chronic right shoulder pain  ONSET DATE: back yrs, L hip March?   SUBJECTIVE:  SUBJECTIVE STATEMENT: Back from [place]  and has pain all over. My hip is killing me.  She reports she has done yoga 2 x since she got back.  She does feel like she knows what she needs to do at this point and can do her [facility]   PERTINENT HISTORY:  Chronic joint pain and stiffness, worse with activity, no joint swelling or erythema Unable to tolerate the following med in the past: Cymbalta caused increased anxiety and insomnia, Lyrica  caused withdrawal symptoms when dose was missed, and Gabapentin caused headache  PAIN:  Are you having pain? Yes: NPRS scale: its bad  Pain location: low back and L hip (anterior/groin)  Pain description: stiff, sharp in her L hip  Aggravating factors: activity, standing on LLE, sitting too long, walking gets a bit better then it gets worse  (30 min)    Relieving factors: Ibu, BC powder, heating pad   Shoulder pain Rt anterior/superior  2/10 with movement Aggs:Cooking, lifting, reaching Alleviate: rest, meds, ice   PRECAUTIONS: None  RED FLAGS: None   WEIGHT BEARING RESTRICTIONS: No  FALLS:  Has patient fallen in last 6 months? No She does reports decreased confidence with taking the bus, stepping off curb.   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   NEXT MD VISIT: Sees [facility] for L hip in about 2 weeks   OBJECTIVE:  Note: Objective measures were completed at Evaluation unless otherwise noted.  DIAGNOSTIC FINDINGS:  None recent   PATIENT SURVEYS:  Modified Oswestry 19/50       COGNITION: Overall cognitive status: Within functional limits for tasks assessed     SENSATION: WFL  MUSCLE LENGTH: Hamstrings: good Thomas test: not measured but L sided stretch addressed painful area   POSTURE: rounded shoulders, forward head, and increased thoracic kyphosis  PALPATION: TTP on Rt lateral hip Pain with palpation to left posterior hip, left rectus femoris and left iliopsoas    UPPER EXTREMITY ROM:   Active ROM Right 06/30/2024 Left 06/30/2024  Shoulder flexion 132 150+  Shoulder extension    Shoulder abduction 95 full  Shoulder adduction    Shoulder internal rotation Pain: FR to mid back    Shoulder external rotation Min pain, 90+   Elbow flexion    Elbow extension    Wrist flexion    Wrist extension    Wrist ulnar deviation    Wrist radial deviation    Wrist pronation    Wrist supination    (Blank rows = not tested)  UPPER EXTREMITY MMT:  MMT Right 06/30/2024 Left 06/30/2024  Shoulder flexion 3+/5 4/5  Shoulder  extension    Shoulder abduction 3+/5 4/5  Shoulder adduction    Shoulder internal rotation 4- pain    Shoulder external rotation 4- pain    Grip strength (lbs)    (Blank rows = not tested)  SHOULDER SPECIAL TESTS:  Impingement tests: Neer impingement test: positive   PALPATION:  Gross pain Rt shoulder , pressure to humeral head felt good   LUMBAR ROM:   AROM eval  Flexion WFL  Extension 75% limited   Right lateral flexion WFL  Left lateral flexion WFL  Right rotation [facility]  Left  rotation WFL   (Blank rows = not tested)  LOWER EXTREMITY ROM:   WNL - pt with borderline hypermobility in her hip IR  Passive  Right eval Left 05/13/24 eval  Hip flexion  Full , pain ant hip  Hip extension    Hip abduction    Hip adduction    Hip internal rotation  45-50 deg  Hip external rotation  Pain 60 deg   Knee flexion  WNL  Knee extension  WNL   Ankle dorsiflexion    Ankle plantarflexion    Ankle inversion    Ankle eversion     (Blank rows = not tested)  LOWER EXTREMITY MMT:    MMT Right eval Left eval Rt 05/13/24 Lt.  05/13/24  Hip flexion 4 4-    Hip extension   3-/5 3-/5  Hip abduction   3-/5 2+/5  Hip adduction      Hip internal rotation       Hip external rotation      Knee flexion 5 5 5 5   Knee extension 5 5 5 5   Ankle dorsiflexion      Ankle plantarflexion      Ankle inversion      Ankle eversion       (Blank rows = not tested)  LUMBAR SPECIAL TESTS:  Straight leg raise test: Negative  Hip testing: Negative left scour test, positive FABER test for L anterior hip    FUNCTIONAL TESTS:  5 times sit to stand: 20 seconds no UE   GAIT: Distance walked: 100 Assistive device utilized: None Level of assistance: Modified independence Comments: Slow pace slight trunk flexion and antalgic patterning  TREATMENT DATE:  [facility] Adult PT Treatment:                                                DATE: 06/30/24 Therapeutic Exercise: NuStep L5 UE and LE for 6 min  Hip  flexor stretch standing (added sidebend)  Hip abd Clam  Modalities: Ice pack 10 min L hip during self care  Self Care: Tips to manage symptoms  HEP review verbal , mostly   [facility] Adult PT Treatment:                                                DATE: 06/11/24 Therapeutic Exercise: Knee extension 15 lbs x 15  Knee flexion 25 lbs x 15  Supine Pilates circle abd/ER Bridge abd/ER x 10  Adduction with Pilates circle Sit to stand with GTB on thighs  Squat GTB on thighs x 15  Hip abduction x 10, 2 sets used 3 lbs  Row 25 lbs neutral grip Lat pull down 25 lbs x 15   [facility] Adult PT Treatment:                                                DATE: 06/10/24 Therapeutic Exercise: Narrow pullovers with iso hold - yellow Supine YTB x 10  Supine long lever flexion - increased pain Supine short lever- increased pain  SL shoulder abduction x flexion x 8 each  SL er AROM Standing cane AAROM flexion and abduction  STS x 10 STS with LLE back x 8 , RLE back x 8 6 inch step up Left increased pain, 4 inch also increased pain Shoulder iso flexi, abdct, ER      [facility] Adult PT Treatment:                                                DATE: 06/03/24 Therapeutic Exercise: Supine Red vs YTB bridge with horiz abdct pull Narrow pullovers with iso hold - red band vs yellow Supine red vs yellow bilat ER ISOMETRICS right shoulder 5 ways- give 3 way for HEP Standing cane shoulder flexion and abdct x 10 each +HEP STS x 10 4 inch step ups x 10 each - no pain 6 inch step up -increased pain left so discontinued Updated HEP     PATIENT EDUCATION:  Education details: see above  Person educated: Patient Education method: Programmer, multimedia, Demonstration, Verbal cues, and Handouts Education comprehension: verbalized understanding and returned demonstration  HOME EXERCISE PROGRAM: Access Code: [email or web address] URL: [email or web address] Date: 04/30/2024 Prepared by: Delon Norma  Access Code:  [email or web address] URL: [email or web address] Date: 05/24/2024 Prepared by: Delon Norma  Exercises - Modified Thomas Stretch  - 1 x daily - 7 x weekly - 1 sets - 3 reps - 1-3 hold - Supine Bridge  - 1 x daily - 7 x weekly - 2 sets - 10 reps - 5 hold - Sidelying Hip Abduction  - 1 x daily - 7 x weekly - 2 sets - 10 reps - 2 hold - Standing Hip Abduction with Counter Support  - 1 x daily - 7 x weekly - 2 sets - 10 reps - 2 hold - Clamshell with Resistance  - 1 x daily - 7 x weekly - 2 sets - 10 reps - Sit to Stand with Resistance Around Legs  - 1 x daily - 7 x weekly - 2 sets - 10 reps - Bridge with Hip Abduction and Resistance  - 1 x daily - 7 x weekly - 2 sets - 10 reps - Hooklying Bilateral Isometric Clamshell  - 1 x daily - 7 x weekly - 2 sets - 10 reps - Supine Hip Abduction  - 1 x daily - 7 x weekly - 2 sets - 10 reps - Supine Hip Adduction Isometric with Ball  - 1 x daily - 7 x weekly - 2 sets - 10 reps - 5 hold - Standing Isometric Hip Flexion with Ball at [employer]  - 1 x daily - 7 x weekly - 1 sets - 10 reps - 5 hold - Shoulder External Rotation and Scapular Retraction with Resistance  - 1 x daily - 7 x weekly - 2 sets - 10 reps - 5 hold - Shoulder Flexion Serratus Activation with Resistance  - 1 x daily - 7 x weekly - 2 sets - 10 reps - 5 hold   ASSESSMENT:  CLINICAL IMPRESSION:   Patient returns for PT with increased pain in her hips since coming back from her trip.  She has been out of her routine for some time.  She understands what she needs to do in order to maintain her strength and mobility.  She is agreeable to discharge from PT knowing she can come back with a new referral if needed.  Patient is a 68 y.o. female who was seen today for physical therapy evaluation and treatment for lower back pain, neck pain.  She is also having a significant amount of pain in her left hip which does seem related to her left iliopsoas.  She presents with signs and symptoms consistent  with a tendinopathy in her gluteus medius and may have some anterior hip involvement as well.  While she was traveling walking extensively and climbing stairs she aggravated it and it continues to be the most bothersome symptom that she has.  She will be getting an x-ray in the coming week and more of an orthopedic workup.  OBJECTIVE IMPAIRMENTS: Abnormal gait, decreased activity tolerance, decreased balance, decreased mobility, difficulty walking, decreased strength, increased fascial restrictions, improper body mechanics, obesity, and pain.   ACTIVITY LIMITATIONS: carrying, lifting, bending, sitting, standing, squatting, sleeping, stairs, transfers, bed mobility, locomotion level, and caring for others  PARTICIPATION LIMITATIONS: cleaning, laundry, interpersonal relationship, shopping, community activity, and occupation  PERSONAL FACTORS: Time since onset of injury/illness/exacerbation, Transportation, and 3+ comorbidities: Fibromyalgia, knee surgeries, anxiety are also affecting patient's functional outcome.   REHAB POTENTIAL: Excellent  CLINICAL DECISION MAKING: Evolving/moderate complexity  EVALUATION COMPLEXITY: Moderate   GOALS: Goals reviewed with patient? Yes  SHORT TERM GOALS: Target date: 05/21/2024   Patient will be able to show independence for initial HEP to include posture, core and hip strength and stability.  Baseline: Goal status: MET   2.  Patient will be able to complete 2 min walk test distance for baseline pain and endurance assessment and goal set  Baseline: 415  Goal status: MET   3.  Pt will complete balance assessment and set goal  Baseline:  Goal status:MET    LONG TERM GOALS: Target date: 06/25/2024    Patient will be able to improve 2 min walk test distance by 50 feet or more with LRAD and no increase in pain.  Baseline:  Goal status: partially met, declined today   2.  Patient will be able to demonstrate hip/knee/ankle strength to 5/5 in order to  maximize functional mobility, ambulation and lifting.   Baseline:  Goal status: partially met   3.  Patient will be independent with final HEP upon discharge from PT and report consistent benefit following exercise completion.   Baseline:  Goal status: MET   4.  Patient will be able to demonstrate right shoulder strength to 4+/5 in with min pain for optimal use of her dominant arm with ADLs and IADLs.  Baseline: 3+/5, update 4+/5 with pain  Goal status: MET   5.  Patient will be able to ambulate in the community > 1000 feet with mod I using least restrictive assistive device and min to no discernable limp, pain managed to 4/10.  Baseline:  Goal status: partially met   6.  Pt will demo L hip abduction to > 4/5 or more in order to support her with ambulation, walking  Baseline:  Goal status: met    7. Pt will improve her DGI score to 24 out of 26 indicating greater gait stability Baseline: 21/26 Goal status: partially met  PLAN:  PT FREQUENCY: 2x/week  PT DURATION: 8 weeks  PLANNED INTERVENTIONS: 97164- PT Re-evaluation, 97750- Physical Performance Testing, 97110-Therapeutic exercises, 97530- Therapeutic activity, [id number]- Neuromuscular re-education, 97535- Self Care, [id number]- Manual therapy, [phone]- Gait training, Patient/Family education, Balance training, Stair training, Taping, Dry Needling, Cryotherapy, and Moist heat.  PLAN FOR NEXT SESSION: NA, Discharge from PT    [facility]  Dajour Pierpoint, PT 06/30/24 8:56 AM Phone: [phone] Fax: [phone]

## 2024-07-06 ENCOUNTER — Encounter: Admitting: Physical Therapy

## 2024-07-15 DIAGNOSIS — Z961 Presence of intraocular lens: Secondary | ICD-10-CM | POA: Diagnosis not present

## 2024-07-15 DIAGNOSIS — H04123 Dry eye syndrome of bilateral lacrimal glands: Secondary | ICD-10-CM | POA: Diagnosis not present

## 2024-07-15 DIAGNOSIS — H40013 Open angle with borderline findings, low risk, bilateral: Secondary | ICD-10-CM | POA: Diagnosis not present

## 2024-07-27 ENCOUNTER — Encounter: Admitting: Physical Therapy

## 2024-08-02 ENCOUNTER — Encounter: Payer: Self-pay | Admitting: Nurse Practitioner

## 2024-08-03 ENCOUNTER — Ambulatory Visit: Admitting: Physical Therapy

## 2024-08-10 ENCOUNTER — Encounter: Admitting: Physical Therapy

## 2024-08-17 ENCOUNTER — Encounter: Admitting: Physical Therapy

## 2024-08-18 ENCOUNTER — Ambulatory Visit (INDEPENDENT_AMBULATORY_CARE_PROVIDER_SITE_OTHER)

## 2024-08-18 ENCOUNTER — Ambulatory Visit (INDEPENDENT_AMBULATORY_CARE_PROVIDER_SITE_OTHER): Admitting: Podiatry

## 2024-08-18 DIAGNOSIS — M7751 Other enthesopathy of right foot: Secondary | ICD-10-CM

## 2024-08-18 DIAGNOSIS — M2041 Other hammer toe(s) (acquired), right foot: Secondary | ICD-10-CM

## 2024-08-18 DIAGNOSIS — M19071 Primary osteoarthritis, right ankle and foot: Secondary | ICD-10-CM

## 2024-08-18 DIAGNOSIS — M778 Other enthesopathies, not elsewhere classified: Secondary | ICD-10-CM

## 2024-08-18 NOTE — Progress Notes (Unsigned)
 Subjective: Chief Complaint  Patient presents with   Foot Pain    Right foot pain pt stated that she has been having pain on the top of her foot for a few months she stated that she has taken medication and tried pain rubs but nothing has helped with the pain she denies any injuries at this time. She stated that at night she has a throbbing sensation.    68 year old female presents the office today with concerns of ongoing right foot pain.  This been a chronic issue for her and she has pain in the top of her foot.  She takes anti-inflammatories.  She had an injection previously which was helpful.  She wears certain shoes and makes it feel better.  No injuries that she reports  Objective: AAO x3, NAD DP/PT pulses palpable bilaterally, CRT less than 3 seconds There is tenderness palpation of the dorsal aspect the right foot on the Lisfranc joint there is a prominent exostosis noted most of the second metatarsal cuneiform joint.  Negative Tinel's sign.  No other areas of pinpoint tenderness.  MMT 5/5. No pain with calf compression, swelling, warmth, erythema  Assessment: Lisfranc arthritis, capsulitis  Plan: -All treatment options discussed with the patient including all alternatives, risks, complications.  -X-rays were obtained reviewed.  Multiple views obtained.  There is arthritic changes present of the Lisfranc joint.  No evidence of acute fracture. -Discussed with conservative as well as surgical treatment options.  At this time she wants to proceed with steroid injection.  Verbal consent obtained.  Skin with Betadine, alcohol.  Mixture of 1 cc Kenalog  10, 0.5 cc of Marcaine  plain 0.5 cc lidocaine  plain was traded on the area maximal tenderness was in the second tarsal cuneiform joint, postinjection care discussed.  Tolerated well. -Procedures Voltaren  gel as well. -Continue shoes, good arch support. -Patient encouraged to call the office with any questions, concerns, change in symptoms.    Donnice JONELLE Fees DPM

## 2024-08-18 NOTE — Patient Instructions (Signed)

## 2024-10-04 ENCOUNTER — Other Ambulatory Visit: Payer: Self-pay | Admitting: Obstetrics and Gynecology

## 2024-10-04 DIAGNOSIS — N952 Postmenopausal atrophic vaginitis: Secondary | ICD-10-CM

## 2025-01-03 ENCOUNTER — Encounter: Payer: Self-pay | Admitting: *Deleted

## 2025-01-05 NOTE — Progress Notes (Deleted)
 " OUTPATIENT PHYSICAL THERAPY FEMALE PELVIC EVALUATION   Patient Name: Betty Holmes MRN: [id number] DOB:22-Mar-1956, 69 y.o., female Today's Date: 01/05/2025  END OF SESSION:   Past Medical History:  Diagnosis Date   Anxiety    Arthritis    At high risk for tick borne illness    had abx--test her again--she was okey---at [facility]   Depression    Fibromyalgia    Fibromyalgia    Past Surgical History:  Procedure Laterality Date   cataracts removal (bilateral) Bilateral    CESAREAN SECTION     CYST EXCISION Right 05/09/2017   Procedure: EXCISION RIGHT INGUINAL CYST;  Surgeon: Signe Mitzie LABOR, MD;  Location: [facility];  Service: General;  Laterality: Right;   JOINT REPLACEMENT     KNEE ARTHROSCOPY     KNEE SURGERY     OVARIAN CYST REMOVAL     Patient Active Problem List   Diagnosis Date Noted   Diverticulosis 04/13/2024   Urinary incontinence, mixed 07/15/2023   Constipation, chronic 10/14/2022   Obesity 10/30/2021   S/P cataract extraction 10/30/2021   Candidal intertrigo 08/22/2021   GAD (generalized anxiety disorder) 09/21/2019   Pain in left wrist 07/24/2017   Painful wrist, right 07/24/2017   Osteopenia 04/21/2017   Cervical stenosis of spine 04/21/2017   Vitamin D  deficiency 04/21/2017   Recurrent major depressive disorder, in partial remission 04/09/2017   Chronic bilateral low back pain 04/09/2017   Fibromyalgia     PCP: Katheen Roselie Rockford, NP   REFERRING PROVIDER: Katheen Roselie Rockford, NP   REFERRING DIAG:  N39.46 (ICD-10-CM) - Urinary incontinence, mixed    THERAPY DIAG:  No diagnosis found.  Rationale for Evaluation and Treatment: Rehabilitation  ONSET DATE: ***  SUBJECTIVE:                                                                                                                                                                                           SUBJECTIVE STATEMENT: Using size 5 cube essary.   Fluid intake:   FUNCTIONAL LIMITATIONS: ***  PERTINENT HISTORY:  Medications for current condition: *** Surgeries: cesarean section  Other: Fibromalgia  Sexual abuse: {Yes/No:304960894}  PAIN:  Are you having pain? {yes/no:20286} NPRS scale: ***/10 Pain location: {pelvic pain location:27098}  Pain type: {type:313116} Pain description: {PAIN DESCRIPTION:21022940}   Aggravating factors: *** Relieving factors: ***  PRECAUTIONS: None  RED FLAGS: {PT Red Flags:29287}   WEIGHT BEARING RESTRICTIONS: No  FALLS:  Has patient fallen in last 6 months? {fallsyesno:27318}  OCCUPATION: ***  ACTIVITY LEVEL : ***  PLOF: {PLOF:24004}  PATIENT GOALS: ***   BOWEL  MOVEMENT: Pain with bowel movement: {yes/no:20286} Type of bowel movement:{PT BM type:27100} Fully empty rectum: {No/Yes:304960894} Leakage: {Yes/No:304960894}                                                  Caused by: *** Bowel urgency: *** Pads: {Yes/No:304960894} Fiber supplement/laxative {YES/NO AS:20300}  URINATION: Pain with urination: {yes/no:20286} Fully empty bladder: {Yes/No:304960894}***                                         Post-void dribble: {YES/NO AS:20300} Stream: {PT urination:27102} Urgency: {YES/NO AS:20300} Frequency:during the day ***                                                        Nocturia: {Yes/No:304960894}***   Leakage: {PT leakage:27103} Pads/briefs: {Yes/No:304960894}  INTERCOURSE:  Ability to have vaginal penetration {YES/NO:21197} Pain with intercourse: {pain with intercourse PA:27099} Dryness: {YES/NO AS:20300} Climax: *** Marinoff Scale: ***/3 Lubricant:  PREGNANCY: Vaginal deliveries 3 forceps with second child with large scar and became infected  Tearing {Yes***/No:304960894} Episiotomy {YES/NO AS:20300} C-section deliveries 1 Currently pregnant {Yes***/No:304960894}  PROLAPSE: {PT prolapse:27101}   OBJECTIVE:  Note: Objective measures were completed  at Evaluation unless otherwise noted.  DIAGNOSTIC FINDINGS:  Post-void residual: Voiding Cystourethrogram (VCUG):  Ultrasound: ***  PATIENT SURVEYS:  {rehab surveys:24030}  PFIQ-7: *** UIQ-7 *** CRAIG -7 *** POPIQ-7 *** Female Sexual Function Index (FSFI) Questionnaire ***  COGNITION: Overall cognitive status: {cognition:24006}     SENSATION: Light touch: {intact/deficits:24005}  LUMBAR SPECIAL TESTS:  {lumbar special test:25242}  FUNCTIONAL TESTS:  {Functional tests:24029} Single leg stance:  Rt:  Lt: Sit-up test: Squat: Bed mobility:  GAIT: Assistive device utilized: {Assistive devices:23999} Comments: ***  POSTURE: {posture:25561}   LUMBARAROM/PROM:  A/PROM A/PROM  Eval (% available)  Flexion   Extension   Right lateral flexion   Left lateral flexion   Right rotation   Left rotation    (Blank rows = not tested)  LOWER EXTREMITY ROM:  {AROM/PROM:27142} ROM Right eval Left eval  Hip flexion    Hip extension    Hip abduction    Hip adduction    Hip internal rotation    Hip external rotation    Knee flexion    Knee extension    Ankle dorsiflexion    Ankle plantarflexion    Ankle inversion    Ankle eversion     (Blank rows = not tested)  LOWER EXTREMITY MMT:  MMT Right eval Left eval  Hip flexion    Hip extension    Hip abduction    Hip adduction    Hip internal rotation    Hip external rotation    Knee flexion    Knee extension    Ankle dorsiflexion    Ankle plantarflexion    Ankle inversion    Ankle eversion     (Blank rows = not tested) PALPATION:  General: ***  Pelvic Alignment: ***  Abdominal: ***  Diastasis: {Yes/No:304960894}*** Distortion: {YES/NO AS:20300}  Breathing: *** Scar tissue: {Yes/No:304960894}*** Active Straight Leg Raise: ***  External Perineal Exam: ***                             Internal Pelvic Floor: ***  Patient confirms identification and approves PT to assess internal  pelvic floor and treatment {yes/no:20286} All internal or external pelvic floor assessments and/or treatments are completed with proper hand hygiene and gloves hands. If needed gloves are changed with hand hygiene during patient care time.  PELVIC MMT:   MMT eval  Vaginal   Internal Anal Sphincter   External Anal Sphincter   Puborectalis   (Blank rows = not tested)        TONE: ***  PROLAPSE: ***  TODAY'S TREATMENT:                                                                                                                              DATE: ***  EVAL ***   PATIENT EDUCATION:  Education details: *** Person educated: {Person educated:25204} Education method: {Education Method:25205} Education comprehension: {Education Comprehension:25206}  HOME EXERCISE PROGRAM: ***  ASSESSMENT:  CLINICAL IMPRESSION: Patient is a *** y.o. *** who was seen today for physical therapy evaluation and treatment for ***.   OBJECTIVE IMPAIRMENTS: {opptimpairments:25111}.   ACTIVITY LIMITATIONS: {activitylimitations:27494}  PARTICIPATION LIMITATIONS: {participationrestrictions:25113}  PERSONAL FACTORS: {Personal factors:25162} are also affecting patient's functional outcome.   REHAB POTENTIAL: {rehabpotential:25112}  CLINICAL DECISION MAKING: {clinical decision making:25114}  EVALUATION COMPLEXITY: {Evaluation complexity:25115}   GOALS: Goals reviewed with patient? {yes/no:20286}  SHORT TERM GOALS: Target date: ***  *** Baseline: Goal status: INITIAL  2.  *** Baseline:  Goal status: INITIAL  3.  *** Baseline:  Goal status: INITIAL  4.  *** Baseline:  Goal status: INITIAL  5.  *** Baseline:  Goal status: INITIAL  6.  *** Baseline:  Goal status: INITIAL  LONG TERM GOALS: Target date: ***  *** Baseline:  Goal status: INITIAL  2.  *** Baseline:  Goal status: INITIAL  3.  *** Baseline:  Goal status: INITIAL  4.  *** Baseline:  Goal status:  INITIAL  5.  *** Baseline:  Goal status: INITIAL  6.  *** Baseline:  Goal status: INITIAL  PLAN:  PT FREQUENCY: {rehab frequency:25116}  PT DURATION: {rehab duration:25117}  PLANNED INTERVENTIONS: {rehab planned interventions:25118::97110-Therapeutic exercises,97530- Therapeutic [phone]- Neuromuscular re-education,97535- Self Rjmz,02859- Manual therapy,Patient/Family education}  PLAN FOR NEXT SESSION: ***   Alysabeth Scalia, PT 01/05/2025, 9:00 AM  "

## 2025-01-06 ENCOUNTER — Encounter: Attending: Nurse Practitioner | Admitting: Physical Therapy

## 2025-01-06 ENCOUNTER — Other Ambulatory Visit: Payer: Self-pay

## 2025-01-06 ENCOUNTER — Encounter: Payer: Self-pay | Admitting: Physical Therapy

## 2025-01-06 DIAGNOSIS — M6281 Muscle weakness (generalized): Secondary | ICD-10-CM | POA: Diagnosis not present

## 2025-01-06 DIAGNOSIS — R279 Unspecified lack of coordination: Secondary | ICD-10-CM | POA: Diagnosis not present

## 2025-01-06 DIAGNOSIS — N3946 Mixed incontinence: Secondary | ICD-10-CM | POA: Insufficient documentation

## 2025-01-06 DIAGNOSIS — R278 Other lack of coordination: Secondary | ICD-10-CM

## 2025-01-06 NOTE — Progress Notes (Signed)
 " OUTPATIENT PHYSICAL THERAPY FEMALE PELVIC EVALUATION   Patient Name: Betty Holmes MRN: [id number] DOB:October 14, 1956, 69 y.o., female Today's Date: 01/06/2025  END OF SESSION:  PT End of Session - 01/06/25 1041     Visit Number 1    Date for Recertification  03/31/25    Authorization Type [employer] medicare/Medicaid    Authorization - Visit Number 1    Authorization - Number of Visits 10    PT Start Time 1030    PT Stop Time 1130    PT Time Calculation (min) 60 min    Activity Tolerance Patient tolerated treatment well    Behavior During Therapy WFL for tasks assessed/performed          Past Medical History:  Diagnosis Date   Anxiety    Arthritis    At high risk for tick borne illness    had abx--test her again--she was okey---at [facility]   Depression    Fibromyalgia    Fibromyalgia    Past Surgical History:  Procedure Laterality Date   cataracts removal (bilateral) Bilateral    CESAREAN SECTION     CYST EXCISION Right 05/09/2017   Procedure: EXCISION RIGHT INGUINAL CYST;  Surgeon: Signe Mitzie LABOR, MD;  Location: [facility];  Service: General;  Laterality: Right;   JOINT REPLACEMENT     KNEE ARTHROSCOPY     KNEE SURGERY     OVARIAN CYST REMOVAL     Patient Active Problem List   Diagnosis Date Noted   Diverticulosis 04/13/2024   Urinary incontinence, mixed 07/15/2023   Constipation, chronic 10/14/2022   Obesity 10/30/2021   S/P cataract extraction 10/30/2021   Candidal intertrigo 08/22/2021   GAD (generalized anxiety disorder) 09/21/2019   Pain in left wrist 07/24/2017   Painful wrist, right 07/24/2017   Osteopenia 04/21/2017   Cervical stenosis of spine 04/21/2017   Vitamin D  deficiency 04/21/2017   Recurrent major depressive disorder, in partial remission 04/09/2017   Chronic bilateral low back pain 04/09/2017   Fibromyalgia     PCP: Katheen Roselie Rockford, NP   REFERRING PROVIDER: Katheen Roselie Rockford, NP   REFERRING  DIAG:  N39.46 (ICD-10-CM) - Urinary incontinence, mixed    THERAPY DIAG:  Muscle weakness (generalized)  Other lack of coordination  Rationale for Evaluation and Treatment: Rehabilitation  ONSET DATE: 2/25  SUBJECTIVE:                                                                                                                                                                                           SUBJECTIVE STATEMENT: Using size 5 cube essary. [name] is  always damp. Mornings after she uses the estrogen cream, she gets up in the  morning and not able to get to the commode. When pulling pants down to urinated she will leak. She reduced her caffeine. The new pessary has helped.  PERTINENT HISTORY:  Medications for current condition: Estrogen cream Surgeries: cesarean section  Other: Fibromalgia   PAIN:  Are you having pain? No  PRECAUTIONS: None  RED FLAGS: None   WEIGHT BEARING RESTRICTIONS: No  FALLS:  Has patient fallen in last 6 months? Yes. Number of falls 1 time she tripped over a suitcase that was on the floor  OCCUPATION: none  ACTIVITY LEVEL : sporadic exercise and when she is in town  PLOF: Independent  PATIENT GOALS: reduce urinary leakage, fully evacuate her bowels in the morning   BOWEL MOVEMENT: Pain with bowel movement: No Type of bowel movement:Type (Bristol Stool Scale) Type 5, Frequency several times in the morning, Strain trying not too, and Splinting yes Fully empty rectum: No Leakage: No                                                  Bowel urgency: has gone away  Fiber supplement/laxative trying to take the fiber  URINATION: Pain with urination: No Fully empty bladder: Nosometimes she feels like she has to urinate again                                         Post-void dribble: Yes  Stream: Weak Urgency: Yes  Frequency:during the day every 2 hours                                                        Nocturia: No1 time    Leakage: Urge to void, Walking to the bathroom, Coughing, Sneezing, and Laughing Pads/briefs: No  INTERCOURSE: not active    PREGNANCY: Vaginal deliveries 3 forceps with second child with large scar and became infected  1 cesarean   PROLAPSE: Bulge   OBJECTIVE:  Note: Objective measures were completed at Evaluation unless otherwise noted.  PATIENT SURVEYS:  PFIQ-7: 62 UIQ-7 29 CRAIG -7 29  COGNITION: Overall cognitive status: Within functional limits for tasks assessed     SENSATION: Light touch: Appears intact   FUNCTIONAL TESTS:  Single leg stance:  Rt: no hip drop  Lt:no hip drop  GAIT: Assistive device utilized: None  POSTURE: rounded shoulders and forward head   LUMBARAROM/PROM: Lumbar ROM is full   LOWER EXTREMITY ROM:  Passive ROM Right eval Left eval  Hip external rotation 40 80   (Blank rows = not tested)  LOWER EXTREMITY MMT:  MMT Right eval Left eval  Hip flexion 4/5 4/5  Hip extension 4/5 4/5  Hip abduction 3/5 3/5   (Blank rows = not tested) PALPATION: Abdominal: tenderness located in the lower abdomen  Diastasis: No Distortion: No  Breathing: decreased movement of the lower rib cage                 External Perineal Exam: able to see the handle of the pessary  Internal Pelvic Floor: no tenderness  Patient confirms identification and approves PT to assess internal pelvic floor and treatment Yes All internal or external pelvic floor assessments and/or treatments are completed with proper hand hygiene and gloves hands. If needed gloves are changed with hand hygiene during patient care time. No emotional/communication barriers or cognitive limitation. Patient is motivated to learn. Patient understands and agrees with treatment goals and plan. PT explains patient will be examined in standing, sitting, and lying down to see how their muscles and joints work. When they are ready, they will be asked to  remove their underwear so PT can examine their perineum. The patient is also given the option of providing their own chaperone as one is not provided in our facility. The patient also has the right and is explained the right to defer or refuse any part of the evaluation or treatment including the internal exam. With the patient's consent, PT will use one gloved finger to gently assess the muscles of the pelvic floor, seeing how well it contracts and relaxes and if there is muscle symmetry. After, the patient will get dressed and PT and patient will discuss exam findings and plan of care. PT and patient discuss plan of care, schedule, attendance policy and HEP activities.   PELVIC MMT:   MMT eval  Vaginal 3/5 holding 10 sec  Internal Anal Sphincter   External Anal Sphincter   Puborectalis   (Blank rows = not tested)        TONE: Average tone of the pelvic floor    TODAY'S TREATMENT:                                                                                                                              DATE: 01/06/25  EVAL Examination completed, findings reviewed, pt educated on POC, HEP, and female pelvic floor anatomy, reasoning with pelvic floor assessment internally with pt consent. Pt motivated to participate in PT and agreeable to attempt recommendations.     PATIENT EDUCATION:  01/06/25 Education details: Access Code: [email or web address] Person educated: Patient Education method: Explanation, Demonstration, Actor cues, Verbal cues, and Handouts Education comprehension: verbalized understanding, returned demonstration, verbal cues required, tactile cues required, and needs further education  HOME EXERCISE PROGRAM: 01/06/25 Access Code: [email or web address] URL: [email or web address] Date: 01/06/2025 Prepared by: Channing Pereyra  Exercises - Seated Pelvic Floor Contraction  - 3 x daily - 7 x weekly - 1 sets - 10 reps - 10 sec hold - Quick Flick Pelvic Floor Contractions Seated    - 1  x daily - 7 x weekly - 3 sets - 10 reps - Seated Hip Adduction Isometrics with Ball  - 1 x daily - 7 x weekly - 1 sets - 10 reps - 5 sec hold  ASSESSMENT:  CLINICAL IMPRESSION: Patient is a 69 y.o. female who was seen today for physical therapy evaluation and treatment for urinary incontinence. Patient is leaking urine with  urge to  void, walking to the bathroom, coughing, sneezing, and laughing. She does not wear a pad just changes her underwear frequently. Pelvic floor strength is 3/5 holding 10 sec. She has weakness in her hips. She is wearing a pessary and takes it out daily. She will have to have several bowl movements that are type 5 in the morning and does not feel she is fully emptying her rectum. Patient will benefit from skilled therapy to reduce urinary leakage and the amount she has to have a bowel movement.    OBJECTIVE IMPAIRMENTS: decreased coordination and decreased strength.   ACTIVITY LIMITATIONS: continence  PARTICIPATION LIMITATIONS: community activity  PERSONAL FACTORS: 1-2 comorbidities: cesarean section, fibromyalgia are also affecting patient's functional outcome.   REHAB POTENTIAL: Excellent  CLINICAL DECISION MAKING: Evolving/moderate complexity  EVALUATION COMPLEXITY: Moderate   GOALS: Goals reviewed with patient? Yes  SHORT TERM GOALS: Target date: 02/03/25  Patient independent with initial HEP for pelvic floor and core strength to reduce leakage.  Baseline: Goal status: INITIAL  2.  Patient educated on abdominal massage to assist with bowel movements.  Baseline:  Goal status: INITIAL  3.  Patient educated on urge suppression drill for urinary urgency.  Baseline:  Goal status: INITIAL   LONG TERM GOALS: Target date: 03/31/25  Patient independent with advanced HEP for pelvic floor strength.  Baseline:  Goal status: INITIAL  2.  Patient is able to have the urge to urinate and walk to the bathroom without leaking urine.  Baseline:  Goal status:  INITIAL  3.  Patient is able to have 1-2 bowel movements in the morning due to improved peristalic motion of the intestines.  Baseline:  Goal status: INITIAL  4.  Patient is able to laugh, sneeze, and cough using the [facility] and urinary leakage is minimal to no leakage.  Baseline:  Goal status: INITIAL   PLAN:  PT FREQUENCY: 1x/week  PT DURATION: 12 weeks  PLANNED INTERVENTIONS: 97110-Therapeutic exercises, 97530- Therapeutic activity, [id number]- Neuromuscular re-education, 97535- Self Care, 02859- Manual therapy, Patient/Family education, and Biofeedback  PLAN FOR NEXT SESSION: pelvic floor strength, hip strength   Channing Pereyra, PT 01/06/25 11:48 AM [facility] [address] [place], [place] [address] Phone # [phone] Fax [phone]  "

## 2025-01-13 ENCOUNTER — Encounter: Admitting: Physical Therapy

## 2025-01-20 ENCOUNTER — Encounter: Admitting: Physical Therapy

## 2025-01-27 ENCOUNTER — Encounter: Admitting: Physical Therapy

## 2025-01-27 ENCOUNTER — Encounter: Payer: Self-pay | Admitting: Physical Therapy

## 2025-01-27 DIAGNOSIS — M6281 Muscle weakness (generalized): Secondary | ICD-10-CM

## 2025-01-27 DIAGNOSIS — R278 Other lack of coordination: Secondary | ICD-10-CM

## 2025-01-27 NOTE — Therapy (Signed)
 [facility] [address] [place], [place], [address] Phone: [phone]   Fax:  [phone]  Physical Therapy Treatment  Patient Details  Name: Betty Holmes MRN: [id number] Date of Birth: [birth year] No data recorded  Encounter Date: 01/27/2025   PT End of Session - 01/27/25 1042     Visit Number 2    Date for Recertification  03/31/25    Authorization Type [employer] medicare/Medicaid    Authorization - Visit Number 2    Authorization - Number of Visits 10    PT Start Time 1040    PT Stop Time 1120    PT Time Calculation (min) 40 min    Activity Tolerance Patient tolerated treatment well    Behavior During Therapy [facility] for tasks assessed/performed          Past Medical History:  Diagnosis Date   Anxiety    Arthritis    At high risk for tick borne illness    had abx--test her again--she was okey---[facility]   Depression    Fibromyalgia    Fibromyalgia     Past Surgical History:  Procedure Laterality Date   cataracts removal (bilateral) Bilateral    CESAREAN SECTION     CYST EXCISION Right 05/09/2017   Procedure: EXCISION RIGHT INGUINAL CYST;  Surgeon: Signe Mitzie LABOR, MD;  Location: [facility];  Service: General;  Laterality: Right;   JOINT REPLACEMENT     KNEE ARTHROSCOPY     KNEE SURGERY     OVARIAN CYST REMOVAL      There were no vitals filed for this visit.   Problem List Patient Active Problem List   Diagnosis Date Noted   Diverticulosis 04/13/2024   Urinary incontinence, mixed 07/15/2023   Constipation, chronic 10/14/2022   Obesity 10/30/2021   S/P cataract extraction 10/30/2021   Candidal intertrigo 08/22/2021   GAD (generalized anxiety disorder) 09/21/2019   Pain in left wrist 07/24/2017   Painful wrist, right 07/24/2017   Osteopenia 04/21/2017   Cervical stenosis of spine 04/21/2017   Vitamin D  deficiency 04/21/2017   Recurrent major  depressive disorder, in partial remission 04/09/2017   Chronic bilateral low back pain 04/09/2017   Fibromyalgia    PCP: Nche, Roselie Rockford, NP    REFERRING PROVIDER: Katheen Roselie Rockford, NP    REFERRING DIAG:  N39.46 (ICD-10-CM) - Urinary incontinence, mixed      THERAPY DIAG:  Muscle weakness (generalized)   Other lack of coordination   Rationale for Evaluation and Treatment: Rehabilitation   ONSET DATE: 2/25   SUBJECTIVE:  SUBJECTIVE STATEMENT: I did not poop in my pants when I was trying to get to the bathroom. I leaked stool when I was coughing when I was away. Patient poops 10 x per day. I feel some poop is stuck somewhere.   PERTINENT HISTORY:  Medications for current condition: Estrogen cream Surgeries: cesarean section  Other: Fibromalgia    PAIN:  Are you having pain? No   PRECAUTIONS: None   RED FLAGS: None       WEIGHT BEARING RESTRICTIONS: No   FALLS:  Has patient fallen in last 6 months? Yes. Number of falls 1 time she tripped over a suitcase that was on the floor   OCCUPATION: none   ACTIVITY LEVEL : sporadic exercise and when she is in town   PLOF: Independent   PATIENT GOALS: reduce urinary leakage, fully evacuate her bowels in the morning     BOWEL MOVEMENT: Pain with bowel movement: No Type of bowel movement:Type (Bristol Stool Scale) Type 5, Frequency several times in the morning, Strain trying not too, and Splinting yes Fully empty rectum: No Leakage: No                                                  Bowel urgency: has gone away  Fiber supplement/laxative trying to take the fiber   URINATION: Pain with urination: No Fully empty bladder: Nosometimes she feels like she has to urinate again                                         Post-void dribble:  Yes  Stream: Weak Urgency: Yes  Frequency:during the day every 2 hours                                                        Nocturia: No1 time   Leakage: Urge to void, Walking to the bathroom, Coughing, Sneezing, and Laughing Pads/briefs: No   INTERCOURSE: not active                PREGNANCY: Vaginal deliveries 3 forceps with second child with large scar and became infected  1 cesarean     PROLAPSE: Bulge     OBJECTIVE:  Note: Objective measures were completed at Evaluation unless otherwise noted.   PATIENT SURVEYS:  PFIQ-7: 62 UIQ-7 29 CRAIG -7 29   COGNITION: Overall cognitive status: Within functional limits for tasks assessed                          SENSATION: Light touch: Appears intact     FUNCTIONAL TESTS:  Single leg stance:             Rt: no hip drop             Lt:no hip drop   GAIT: Assistive device utilized: None   POSTURE: rounded shoulders and forward head     LUMBARAROM/PROM: Lumbar ROM is full     LOWER EXTREMITY ROM:   Passive ROM Right eval Left eval  Hip external rotation 40 80   (  Blank rows = not tested)   LOWER EXTREMITY MMT:   MMT Right eval Left eval  Hip flexion 4/5 4/5  Hip extension 4/5 4/5  Hip abduction 3/5 3/5   (Blank rows = not tested) PALPATION: Abdominal: tenderness located in the lower abdomen             Diastasis: No Distortion: No  Breathing: decreased movement of the lower rib cage                   External Perineal Exam: able to see the handle of the pessary                              Internal Pelvic Floor: no tenderness   Patient confirms identification and approves PT to assess internal pelvic floor and treatment Yes All internal or external pelvic floor assessments and/or treatments are completed with proper hand hygiene and gloves hands. If needed gloves are changed with hand hygiene during patient care time. No emotional/communication barriers or cognitive limitation. Patient is motivated  to learn. Patient understands and agrees with treatment goals and plan. PT explains patient will be examined in standing, sitting, and lying down to see how their muscles and joints work. When they are ready, they will be asked to remove their underwear so PT can examine their perineum. The patient is also given the option of providing their own chaperone as one is not provided in our facility. The patient also has the right and is explained the right to defer or refuse any part of the evaluation or treatment including the internal exam. With the patient's consent, PT will use one gloved finger to gently assess the muscles of the pelvic floor, seeing how well it contracts and relaxes and if there is muscle symmetry. After, the patient will get dressed and PT and patient will discuss exam findings and plan of care. PT and patient discuss plan of care, schedule, attendance policy and HEP activities.    PELVIC MMT:   MMT eval  Vaginal 3/5 holding 10 sec  Internal Anal Sphincter    External Anal Sphincter    Puborectalis    (Blank rows = not tested)         TONE: Average tone of the pelvic floor       TODAY'S TREATMENT:    01/27/25: Manual: Soft tissue mobilization: Circular abdominal massage to promote peristalic motion of the intestines Manual work to the diaphragm Myofascial release: Fascial release of the lower abdomen, along the mesenteric root, along the iliocecal valve, along the area of the sac of douglas to reduce restrictions Self-care: Educated patient on what a rectocele is, how it progresses, and how to manage with a bowel movement by splinting Educated patient on how to firm up her stool to reduce the amount of stool leakage Educated patient on how to perform abdominal massage to assist with bowel movements  DATE: 01/06/25  EVAL Examination completed,  findings reviewed, pt educated on POC, HEP, and female pelvic floor anatomy, reasoning with pelvic floor assessment internally with pt consent. Pt motivated to participate in PT and agreeable to attempt recommendations.        PATIENT EDUCATION:  01/27/25 Education details: Access Code: [email or web address] Person educated: Patient Education method: Explanation, Demonstration, Actor cues, Verbal cues, and Handouts Education comprehension: verbalized understanding, returned demonstration, verbal cues required, tactile cues required, and needs further education   HOME EXERCISE PROGRAM: 01/27/25 Access Code: [email or web address] URL: [email or web address] Date: 01/27/2025 Prepared by: Channing Pereyra  Exercises - Seated Pelvic Floor Contraction  - 3 x daily - 7 x weekly - 1 sets - 10 reps - 10 sec hold - Quick Flick Pelvic Floor Contractions Seated    - 1 x daily - 7 x weekly - 3 sets - 10 reps - Seated Hip Adduction Isometrics with Ball  - 1 x daily - 7 x weekly - 1 sets - 10 reps - 5 sec hold  Patient Education - Abdominal Massage for Constipation   ASSESSMENT:   CLINICAL IMPRESSION: Patient is a 69 y.o. female who was seen today for physical therapy  treatment for urinary incontinence.  Patient is having looser stools so did educated on how to firm up the stool. Patient felt better after the abdominal massage. She understands what a rectocele is and how to splint to fully empty the rectum. Patient will benefit from skilled therapy to reduce urinary leakage and the amount she has to have a bowel movement.     OBJECTIVE IMPAIRMENTS: decreased coordination and decreased strength.    ACTIVITY LIMITATIONS: continence   PARTICIPATION LIMITATIONS: community activity   PERSONAL FACTORS: 1-2 comorbidities: cesarean section, fibromyalgia are also affecting patient's functional outcome.    REHAB POTENTIAL: Excellent   CLINICAL DECISION MAKING: Evolving/moderate complexity   EVALUATION COMPLEXITY:  Moderate     GOALS: Goals reviewed with patient? Yes   SHORT TERM GOALS: Target date: 02/03/25   Patient independent with initial HEP for pelvic floor and core strength to reduce leakage.  Baseline: Goal status: INITIAL   2.  Patient educated on abdominal massage to assist with bowel movements.  Baseline:  Goal status: Met 01/27/25   3.  Patient educated on urge suppression drill for urinary urgency.  Baseline:  Goal status: INITIAL     LONG TERM GOALS: Target date: 03/31/25   Patient independent with advanced HEP for pelvic floor strength.  Baseline:  Goal status: INITIAL   2.  Patient is able to have the urge to urinate and walk to the bathroom without leaking urine.  Baseline:  Goal status: INITIAL   3.  Patient is able to have 1-2 bowel movements in the morning due to improved peristalic motion of the intestines.  Baseline:  Goal status: INITIAL   4.  Patient is able to laugh, sneeze, and cough using the [facility] and urinary leakage is minimal to no leakage.  Baseline:  Goal status: INITIAL     PLAN:   PT FREQUENCY: 1x/week   PT DURATION: 12 weeks   PLANNED INTERVENTIONS: 97110-Therapeutic exercises, 97530- Therapeutic activity, 97112- Neuromuscular re-education, 97535- Self Care, [id number]- Manual therapy, Patient/Family education, and Biofeedback   PLAN FOR NEXT SESSION: pelvic floor strength, hip strength, urge suppression  Channing Pereyra, PT 01/27/25 11:34 AM  [facility] [address] [place], [place], [address] Phone: [phone]   Fax:  [phone]  Name: TEMARI SCHOOLER MRN: [id number] Date of Birth: Aug 31, 1956

## 2025-02-03 ENCOUNTER — Encounter: Admitting: Physical Therapy

## 2025-04-29 ENCOUNTER — Ambulatory Visit
